# Patient Record
Sex: Male | Born: 1945 | Race: White | Hispanic: No | Marital: Married | Smoking: Former smoker
Health system: Southern US, Community
[De-identification: ages and names within clinical notes are randomized; demographics above are authoritative.]

## PROBLEM LIST (undated history)

## (undated) ENCOUNTER — Emergency Department (HOSPITAL_BASED_OUTPATIENT_CLINIC_OR_DEPARTMENT_OTHER): Admission: EM | Payer: Medicare Other | Source: Home / Self Care

## (undated) DIAGNOSIS — F5104 Psychophysiologic insomnia: Secondary | ICD-10-CM

## (undated) DIAGNOSIS — I10 Essential (primary) hypertension: Secondary | ICD-10-CM

## (undated) DIAGNOSIS — E785 Hyperlipidemia, unspecified: Secondary | ICD-10-CM

## (undated) DIAGNOSIS — I499 Cardiac arrhythmia, unspecified: Secondary | ICD-10-CM

## (undated) DIAGNOSIS — I878 Other specified disorders of veins: Secondary | ICD-10-CM

## (undated) DIAGNOSIS — J986 Disorders of diaphragm: Secondary | ICD-10-CM

## (undated) DIAGNOSIS — Z87891 Personal history of nicotine dependence: Secondary | ICD-10-CM

## (undated) DIAGNOSIS — E669 Obesity, unspecified: Secondary | ICD-10-CM

## (undated) DIAGNOSIS — I350 Nonrheumatic aortic (valve) stenosis: Secondary | ICD-10-CM

## (undated) DIAGNOSIS — C801 Malignant (primary) neoplasm, unspecified: Secondary | ICD-10-CM

## (undated) DIAGNOSIS — R600 Localized edema: Secondary | ICD-10-CM

## (undated) DIAGNOSIS — I482 Chronic atrial fibrillation, unspecified: Secondary | ICD-10-CM

## (undated) DIAGNOSIS — E66811 Obesity, class 1: Secondary | ICD-10-CM

## (undated) HISTORY — DX: Personal history of nicotine dependence: Z87.891

## (undated) HISTORY — PX: TONSILLECTOMY: SUR1361

## (undated) HISTORY — DX: Nonrheumatic aortic (valve) stenosis: I35.0

## (undated) HISTORY — DX: Chronic atrial fibrillation, unspecified: I48.20

## (undated) HISTORY — DX: Essential (primary) hypertension: I10

## (undated) HISTORY — DX: Obesity, unspecified: E66.9

## (undated) HISTORY — DX: Obesity, class 1: E66.811

---

## 2005-08-23 ENCOUNTER — Ambulatory Visit: Payer: Self-pay | Admitting: Internal Medicine

## 2005-09-25 ENCOUNTER — Ambulatory Visit: Payer: Self-pay | Admitting: Internal Medicine

## 2005-11-20 ENCOUNTER — Ambulatory Visit: Payer: Self-pay | Admitting: Internal Medicine

## 2006-11-07 ENCOUNTER — Ambulatory Visit: Payer: Self-pay | Admitting: Internal Medicine

## 2007-11-06 ENCOUNTER — Ambulatory Visit: Payer: Self-pay | Admitting: Internal Medicine

## 2007-11-06 DIAGNOSIS — E669 Obesity, unspecified: Secondary | ICD-10-CM

## 2007-11-06 DIAGNOSIS — R600 Localized edema: Secondary | ICD-10-CM

## 2007-11-06 DIAGNOSIS — R0602 Shortness of breath: Secondary | ICD-10-CM | POA: Insufficient documentation

## 2007-11-06 DIAGNOSIS — I10 Essential (primary) hypertension: Secondary | ICD-10-CM | POA: Insufficient documentation

## 2008-03-11 ENCOUNTER — Emergency Department (HOSPITAL_COMMUNITY): Admission: EM | Admit: 2008-03-11 | Discharge: 2008-03-11 | Payer: Self-pay | Admitting: Emergency Medicine

## 2011-01-18 NOTE — Assessment & Plan Note (Signed)
[facility]                             PULMONARY OFFICE NOTE   Alex Alvarez                      MRN:          [id number]  DATE:11/07/2006                            DOB:          Oct 23, 1945    PROBLEM LIST:  1. Dyspnea.  2. Left hemi-diaphragm paresis.  3. Exogenous obesity and deconditioning.  4. Peripheral edema.   HISTORY OF PRESENT ILLNESS:  A 1 year followup. He has done very well  through this winter with no particular problems. Over the past year, he  has been walking regularly to lose weight and has noted improvement in  his breathing as expected with this. He had pneumococcal vaccine in  2007. No routine cough, chest pain, or sputum.   MEDICATIONS:  Diovan/HCTZ 12.5 mg.   ALLERGIES:  LAMISIL, which causes hives.   OBJECTIVE:  VITAL SIGNS:  Weight 250 pounds, blood pressure 150/90,  pulse 72. Room air saturation 96%. He has lost 13 pounds since last  year.  LUNGS:  There is dullness to percussion at the left base. Otherwise,  chest is clear. Breathing is unlabored. There is no stridor or neck vein  distention.  HEART:  Sounds are regular without murmur. No peripheral edema.   IMPRESSION:  Chronic left hemi-diaphragm paresis. Chest x-ray a year ago  indicates no nodules or masses.   PLAN:  Schedule return in 1 year. Keep walking.     Clinton D. Maple Hudson, MD, Tonny Bollman, FACP  Electronically Signed    CDY/MedQ  DD: 11/08/2006  DT: 11/08/2006  Job #: [id number]   cc:   [facility]

## 2011-02-22 HISTORY — PX: TRANSTHORACIC ECHOCARDIOGRAM: SHX275

## 2011-07-30 HISTORY — PX: NM MYOVIEW LTD: HXRAD82

## 2012-10-28 ENCOUNTER — Other Ambulatory Visit (HOSPITAL_COMMUNITY): Payer: Self-pay | Admitting: Cardiovascular Disease

## 2012-10-28 DIAGNOSIS — I4891 Unspecified atrial fibrillation: Secondary | ICD-10-CM

## 2012-10-31 DIAGNOSIS — I350 Nonrheumatic aortic (valve) stenosis: Secondary | ICD-10-CM

## 2012-10-31 HISTORY — DX: Nonrheumatic aortic (valve) stenosis: I35.0

## 2012-11-05 ENCOUNTER — Encounter (HOSPITAL_COMMUNITY): Payer: Self-pay

## 2012-11-05 ENCOUNTER — Ambulatory Visit (HOSPITAL_COMMUNITY): Payer: Self-pay

## 2012-11-12 ENCOUNTER — Other Ambulatory Visit (HOSPITAL_COMMUNITY): Payer: Self-pay | Admitting: Cardiovascular Disease

## 2012-11-12 ENCOUNTER — Ambulatory Visit (HOSPITAL_COMMUNITY)
Admission: RE | Admit: 2012-11-12 | Discharge: 2012-11-12 | Disposition: A | Discharge status: Home / Self Care | Payer: Managed Care, Other (non HMO) | Source: Ambulatory Visit | Attending: Cardiovascular Disease | Admitting: Cardiovascular Disease

## 2012-11-12 DIAGNOSIS — I714 Abdominal aortic aneurysm, without rupture, unspecified: Secondary | ICD-10-CM | POA: Insufficient documentation

## 2012-11-12 HISTORY — PX: TRANSTHORACIC ECHOCARDIOGRAM: SHX275

## 2012-11-12 HISTORY — PX: OTHER SURGICAL HISTORY: SHX169

## 2012-11-12 NOTE — Progress Notes (Signed)
Aorta Duplex Completed. Sturdivant, Rita D  

## 2012-11-12 NOTE — Progress Notes (Signed)
[facility]   2D echo completed 11/12/2012.   Cindy Rigg, RDCS  

## 2013-04-23 ENCOUNTER — Encounter: Payer: Self-pay | Admitting: Cardiovascular Disease

## 2013-10-14 ENCOUNTER — Other Ambulatory Visit: Payer: Self-pay

## 2013-10-14 MED ORDER — SIMVASTATIN 20 MG PO TABS: 20.0000 mg | ORAL_TABLET | Freq: Every day | ORAL | Status: DC

## 2013-10-14 NOTE — Telephone Encounter (Signed)
Rx was sent to pharmacy electronically. 

## 2013-10-19 ENCOUNTER — Ambulatory Visit: Payer: Medicare Other | Admitting: Cardiology

## 2013-11-02 ENCOUNTER — Telehealth: Payer: Self-pay | Admitting: *Deleted

## 2013-11-29 ENCOUNTER — Ambulatory Visit: Payer: Medicare Other | Admitting: Cardiology

## 2013-12-21 ENCOUNTER — Encounter: Payer: Self-pay | Admitting: *Deleted

## 2013-12-24 ENCOUNTER — Ambulatory Visit (INDEPENDENT_AMBULATORY_CARE_PROVIDER_SITE_OTHER): Payer: Managed Care, Other (non HMO) | Admitting: Cardiology

## 2013-12-24 VITALS — BP 140/106 | HR 56 | Ht 72.0 in | Wt 235.9 lb

## 2013-12-24 DIAGNOSIS — I10 Essential (primary) hypertension: Secondary | ICD-10-CM

## 2013-12-24 DIAGNOSIS — I480 Paroxysmal atrial fibrillation: Secondary | ICD-10-CM

## 2013-12-24 DIAGNOSIS — E785 Hyperlipidemia, unspecified: Secondary | ICD-10-CM

## 2013-12-24 DIAGNOSIS — E669 Obesity, unspecified: Secondary | ICD-10-CM

## 2013-12-24 DIAGNOSIS — Z01818 Encounter for other preprocedural examination: Secondary | ICD-10-CM

## 2013-12-24 DIAGNOSIS — I4891 Unspecified atrial fibrillation: Secondary | ICD-10-CM

## 2013-12-24 DIAGNOSIS — I872 Venous insufficiency (chronic) (peripheral): Secondary | ICD-10-CM

## 2013-12-24 MED ORDER — SIMVASTATIN 20 MG PO TABS: 20.0000 mg | ORAL_TABLET | Freq: Every day | ORAL | Status: DC

## 2013-12-24 NOTE — Patient Instructions (Addendum)
Continue with current medication  Okay to have cataract surgery, may hold ASPIRIN  AND PLAVIX for 5 days prior to surgery and restart after per Dr Talbert Forest.  Your physician wants you to follow-up ii 6 month DR HARDING.  You will receive a reminder letter in the mail two months in advance. If you don't receive a letter, please call our office to schedule the follow-up appointment.

## 2013-12-25 ENCOUNTER — Encounter: Payer: Self-pay | Admitting: Cardiology

## 2013-12-25 DIAGNOSIS — Z01810 Encounter for preprocedural cardiovascular examination: Secondary | ICD-10-CM | POA: Insufficient documentation

## 2013-12-25 DIAGNOSIS — I878 Other specified disorders of veins: Secondary | ICD-10-CM | POA: Insufficient documentation

## 2013-12-25 DIAGNOSIS — I4821 Permanent atrial fibrillation: Secondary | ICD-10-CM | POA: Insufficient documentation

## 2013-12-25 DIAGNOSIS — I4811 Longstanding persistent atrial fibrillation: Secondary | ICD-10-CM | POA: Insufficient documentation

## 2013-12-25 DIAGNOSIS — I872 Venous insufficiency (chronic) (peripheral): Secondary | ICD-10-CM | POA: Insufficient documentation

## 2013-12-25 DIAGNOSIS — E785 Hyperlipidemia, unspecified: Secondary | ICD-10-CM | POA: Insufficient documentation

## 2013-12-25 NOTE — Assessment & Plan Note (Signed)
Well-controlled heart rate with diltiazem. Based on previous discussions with Dr. Rollene Fare, Toprol aspirin plus Plavix we discussed the relative risk reductions using aspirin versus aspirin plus Plavix versus warfarin or NOAC. He has left her, what this point continuing with aspirin plus Plavix, but it recommended that as he gets closer to 68 years old and above, that we consider switching him to NOAC. They both agree with this plan, fully understanding the risks of bleeding versus stroke using the [place].

## 2013-12-25 NOTE — Assessment & Plan Note (Signed)
His diastolic pressure seems elevated today out of proportion to what one would expect. He tells me at home his blood pressures are usually in the 120/80 range. Myelination is to just simply continue with his current regimen.

## 2013-12-25 NOTE — Assessment & Plan Note (Signed)
Deferred to PCP. Upon simvastatin. We may need to consider switching from simvastatin to a different statin simply because of possible interaction with diltiazem if the dose needs to be increased.

## 2013-12-25 NOTE — Assessment & Plan Note (Signed)
Findings are definitely consistent with mild venous stasis. 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 He is not interested in a procedure to evaluate for possible ablation procedures. The edema is not significant enough for him to consider using a true diuretic. We could consider increasing the HCTZ complement his Diovan HCT, but for now he seems stable.

## 2013-12-25 NOTE — Assessment & Plan Note (Signed)
For cataract surgery which is very low risk. He is low risk patient. Okay to stop aspirin plus Plavix for the procedure; restart when safe.

## 2013-12-25 NOTE — Assessment & Plan Note (Signed)
Recommend continue exercise and dietary modification.

## 2013-12-25 NOTE — Progress Notes (Signed)
PATIENT: Alex Alvarez MRN: [id number] DOB: [birth year] PCP: Woody Seller, MD  Clinic Note: Chief Complaint  Patient presents with  . 6 month visit    no chest pain , no sob ,no edema-wearing support hose --recent flight - legs liitle swelling,---NEED CLEARANCE CATARACT SURGERY-  Dr Talbert Forest   HPI: Alex Alvarez is a 68 y.o. male with a PMH below who presents today for establishment of cardiology care at the retirement Dr. Terance Ice. He is a history of atrial fibrillation (likely paroxysmal) as well as hypertension and lower extremity edema with possible venous stasis. Saw Dr. Rollene Fare in August of 2014. After detailed discussions with Dr. Rollene Fare during his last couple visits, they decided to start him on aspirin plus Plavix based on a CHADS2-VASc score of 2. He is scheduled to have cataract surgery by Dr. Talbert Forest  Interval History: He remains essentially asymptomatic from an atrial fibrillation standpoint. He says he notes at night when he lies down after taking his Ambien, but his heart rate will slow down, but otherwise really denies any sensation of irregular or rapid heartbeats. He denies any chest tightness or pressure symptoms to suggest angina with rest or exertion. No dyspnea with rest or exertion. No PND, orthopnea but he does have chronic mild edema for which he uses compression stockings. He is concerned about some mild discoloration in his legs and feet but otherwise when he puts his feet up at the end of the day, the swelling goes down.  The remainder of cardiac review of systems is as follows: no chest pain or dyspnea on exertion positive for - edema and irregular heartbeat negative for - dyspnea on exertion, loss of consciousness, murmur, orthopnea, palpitations, paroxysmal nocturnal dyspnea, rapid heart rate or shortness of breath, lightheadedness/dizziness SAB/near-syncope TIA/amaurosis fugax symptoms, melena hematochezia, hematuria, epistaxis. No  claudication.   Past Medical History  Diagnosis Date  . Hypertension   . Paroxysmal atrial fibrillation 8/132014 office note    chronic afib dates back to June 2012; CHADS2-VASc score 2; candidate for dual  antiplatelet therapy based on active C and active S STUDIES  for thromboembolic prophyaxis--actually ot on coumadin but on aspirin ad plavix  . Former light tobacco smoker      smoked a pipe; quit 25+ years ago   . Obesity (BMI 30.0-34.9)     Prior Cardiac Evaluation and Past Surgical History: Past Surgical History  Procedure Laterality Date  . Transthoracic echocardiogram  02/22/2011    EF 50-55%; with mild inferior wall hypokinesis, mild left atrial enlargement of 4.2cm  . Nm myoview ltd  07/30/2011    non-gated study seconadry to a fib;exercise capcity 7 mets--mildly reduced;low risk scan  . Aorta doppler  11/12/2012    normal abdomnal aortic duplex   . Transthoracic echocardiogram  11/12/12    afib; EF 50-55%    No Known Allergies  Current Outpatient Prescriptions  Medication Sig Dispense Refill  . aspirin EC 81 MG tablet Take 2 tablets daily.      . clopidogrel (PLAVIX) 75 MG tablet Take 75 mg by mouth daily with breakfast.      . diclofenac (VOLTAREN) 75 MG EC tablet Take 75 mg by mouth daily.      [name] diltiazem (CARTIA XT) 240 MG 24 hr capsule Take 240 mg by mouth daily.      . simvastatin (ZOCOR) 20 MG tablet Take 1 tablet (20 mg total) by mouth at bedtime.  90 tablet  3  . valsartan-hydrochlorothiazide (DIOVAN-HCT)  320-12.5 MG per tablet Take 1 tablet by mouth daily.      [name] zolpidem (AMBIEN) 10 MG tablet Take 10 mg by mouth at bedtime.       No current facility-administered medications for this visit.    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   He is active and ambulatory, walking on a regular basis roughly 3-5 days a week 3 miles a time.   He used to smoke a pipe but  quit 25 years ago.    Family History:  Cancer in his maternal grandfather; Cancer - Ovarian in his mother.  ROS: A comprehensive Review of Systems - Negative except Symptoms noted in history of present illness.  PHYSICAL EXAM BP 140/106  Pulse 56  Ht 6' (1.829 m)  Wt 235 lb 14.4 oz (107.004 kg)  BMI 31.99 kg/m2 General appearance: alert, cooperative, appears stated age, no distress, mildly obese and Well-nourished and well-groomed. Pleasant mood and affect Neck: no adenopathy, no carotid bruit, no JVD and supple, symmetrical, trachea midline Lungs: clear to auscultation bilaterally, normal percussion bilaterally and Nonlabored, good movement. Heart: irregularly irregular rhythm and Normal S1-S2. Nondisplaced PMI. No M./R./G. Abdomen: soft, non-tender; bowel sounds normal; no masses,  no organomegaly Extremities: edema 1+ in the ankles and feet with no clubbing or cyanosis., no ulcers, gangrene or trophic changes and venous stasis dermatitis noted Pulses: 2+ and symmetric Skin: Mild punctate macular discoloration in the feet and ankles consistent with venous stasis dermatitis Neurologic: Alert and oriented X 3, normal strength and tone. Normal symmetric reflexes. Normal coordination and gait   Adult ECG Report  Rate:  56  ;  Rhythm: atrial fibrillation and With slow/controlled ventricular response  QRS Axis:  67  ;  PR Interval: * ;  QRS Duration: 88 ; QTc:  397; normal voltage  Conduction Disturbances: none  Other Abnormalities: none   Narrative Interpretation:  A. fib with controlled rate. Otherwise normal   Recent Labs:  none  ASSESSMENT / PLAN: Relatively stable with controlled A. fib, asymptomatic. No reason that he cannot undergo cataract surgery. He can stop aspirin plus Plavix if need be.  Paroxysmal atrial fibrillation Well-controlled heart rate with diltiazem. Based on previous discussions with Dr. Rollene Fare, Toprol aspirin plus Plavix we discussed the relative risk  reductions using aspirin versus aspirin plus Plavix versus warfarin or NOAC. He has left her, what this point continuing with aspirin plus Plavix, but it recommended that as he gets closer to 38 years old and above, that we consider switching him to NOAC. They both agree with this plan, fully understanding the risks of bleeding versus stroke using the [place].  Venous insufficiency of lower extremity Findings are definitely consistent with mild venous stasis. 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 He is not interested in a procedure to evaluate for possible ablation procedures. The edema is not significant enough for him to consider using a true diuretic. We could consider increasing the HCTZ complement his Diovan HCT, but for now he seems stable.  HYPERTENSION His diastolic pressure seems elevated today out of proportion to what one would expect. He tells me at home his blood pressures are usually in the 120/80 range. Myelination is to just simply continue with his current regimen.  EXOGENOUS OBESITY Recommend continue exercise and dietary modification.  Dyslipidemia -  on statin; monitored by PCP Deferred to PCP. Upon simvastatin. We may need to consider switching from simvastatin to a different statin simply because of possible interaction with diltiazem if the dose needs to be increased.  Preoperative clearance For cataract surgery which is very low risk. He is low risk patient. Okay to stop aspirin plus Plavix for the procedure; restart when safe.   Orders Placed This Encounter  Procedures  . EKG 12-Lead   Meds ordered this encounter  Medications  . simvastatin (ZOCOR) 20 MG tablet    Sig: Take 1 tablet (20 mg total) by mouth at bedtime.    Dispense:  90 tablet    Refill:  3    Followup:  6  months  Laconya Clere W. Ellyn Hack, M.D., M.S. Interventional Cardiolgy [facility]

## 2014-01-03 NOTE — Telephone Encounter (Signed)
Encounter Closed---01/03/14 TP 

## 2014-02-07 ENCOUNTER — Telehealth: Payer: Self-pay | Admitting: Cardiovascular Disease

## 2014-02-07 NOTE — Telephone Encounter (Signed)
Called Dr. Talbert Forest office informed them the clearance requests never received. They can fax over another one, however I will fax over a note that Dr. Ellyn Hack had dictated indicating the patient has been cleared for surgery. Note faxed to [phone] Attent: Sharyn Lull.

## 2014-02-07 NOTE — Telephone Encounter (Signed)
Returned a call to  Dr. Talbert Forest office. They are currently closed for lunch. Will attempt to call back later.

## 2014-02-07 NOTE — Telephone Encounter (Signed)
She said she have faxed over two fax to clear him for surgery. One was sent on 4-17 and the last one was sent 5-11. He is scheduled for surgery on Friday,need this asap.She is faxing another right away.

## 2014-03-15 ENCOUNTER — Telehealth: Payer: Self-pay | Admitting: Cardiology

## 2014-03-15 NOTE — Telephone Encounter (Signed)
Spoke to [place] THAT PATIENT HAD OFFICE NOTE 12/25/13 RN INFORMED [employer]

## 2014-03-15 NOTE — Telephone Encounter (Signed)
They still have not received the clarence for surgery,it have been faxed over 3 times.Are you getting the fax?His surgery is scheduled for 03-28-14,need this back asap.

## 2014-03-15 NOTE — Telephone Encounter (Signed)
FOLLOW UP FROM  LAST NOTE RN informed Alex Alvarez ,unable to locate clearance from Dr Talbert Forest. RN faxed last office note from 12/26/23, confirm it had been received. Alex Alvarez will show to Dr Talbert Forest.[name]

## 2014-03-21 ENCOUNTER — Telehealth: Payer: Self-pay | Admitting: Cardiology

## 2014-03-21 MED ORDER — CLOPIDOGREL BISULFATE 75 MG PO TABS: 75.0000 mg | ORAL_TABLET | Freq: Every day | ORAL | Status: DC

## 2014-03-21 NOTE — Telephone Encounter (Signed)
Rx refill sent to patient pharmacy   

## 2014-03-21 NOTE — Telephone Encounter (Signed)
Need a new prescription for his generic plavix, Please call to [facility].Pt is going to change insurance,any questions please call him.

## 2014-03-31 ENCOUNTER — Telehealth: Payer: Self-pay | Admitting: Cardiology

## 2014-04-05 NOTE — Telephone Encounter (Signed)
Closed encounter °

## 2014-05-17 ENCOUNTER — Other Ambulatory Visit: Payer: Self-pay | Admitting: *Deleted

## 2014-05-17 ENCOUNTER — Telehealth: Payer: Self-pay | Admitting: Cardiology

## 2014-05-17 MED ORDER — SIMVASTATIN 20 MG PO TABS: 20.0000 mg | ORAL_TABLET | Freq: Every day | ORAL | Status: DC

## 2014-05-17 NOTE — Telephone Encounter (Signed)
Opened by mistake.

## 2014-05-17 NOTE — Telephone Encounter (Signed)
Rx sent into [facility]. Patient notified

## 2014-05-17 NOTE — Telephone Encounter (Signed)
Patient needs refill on his Simvastatin 20 mg # 90 i daily.

## 2014-06-16 ENCOUNTER — Telehealth: Payer: Self-pay | Admitting: Cardiology

## 2014-06-17 NOTE — Telephone Encounter (Signed)
Close encounter 

## 2014-06-23 ENCOUNTER — Ambulatory Visit: Payer: Managed Care, Other (non HMO) | Admitting: Cardiology

## 2014-06-29 ENCOUNTER — Telehealth: Payer: Self-pay | Admitting: Cardiology

## 2014-07-01 NOTE — Telephone Encounter (Signed)
Closed encounter °

## 2014-08-01 ENCOUNTER — Ambulatory Visit (INDEPENDENT_AMBULATORY_CARE_PROVIDER_SITE_OTHER): Payer: Medicare Other | Admitting: Cardiology

## 2014-08-01 ENCOUNTER — Encounter: Payer: Self-pay | Admitting: Cardiology

## 2014-08-01 VITALS — BP 134/82 | HR 61 | Ht 72.0 in | Wt 236.0 lb

## 2014-08-01 DIAGNOSIS — I10 Essential (primary) hypertension: Secondary | ICD-10-CM

## 2014-08-01 DIAGNOSIS — I482 Chronic atrial fibrillation, unspecified: Secondary | ICD-10-CM

## 2014-08-01 DIAGNOSIS — E669 Obesity, unspecified: Secondary | ICD-10-CM

## 2014-08-01 DIAGNOSIS — E785 Hyperlipidemia, unspecified: Secondary | ICD-10-CM

## 2014-08-01 DIAGNOSIS — I872 Venous insufficiency (chronic) (peripheral): Secondary | ICD-10-CM

## 2014-08-01 MED ORDER — ATORVASTATIN CALCIUM 10 MG PO TABS: 10.0000 mg | ORAL_TABLET | Freq: Every day | ORAL | Status: DC

## 2014-08-01 NOTE — Patient Instructions (Addendum)
Take 1/2 tablets of simvastatin until bottles is complete then stop and start lipitor (atorvastatin) 10 mg one tablet at bedtime.  Administrator[facility] when you are ready to pick up ATORVASTATIN)  Your physician wants you to follow-up in 12 months Dr Ellyn Hack.   You will receive a reminder letter in the mail two months in advance. If you don't receive a letter, please call our office to schedule the follow-up appointment.

## 2014-08-01 NOTE — Progress Notes (Signed)
PATIENT: Alex Alvarez MRN: [id number] DOB: 25-Mar-1946 PCP: Woody Seller, MD  Clinic Note: Chief Complaint  Patient presents with  . Follow-up    6 mth visit.   pt denies chest pain, swelling and sob  . Atrial Fibrillation    Persistant.  . Hyperlipidemia    Cramping with Simvastatin   HPI: Alex Alvarez is a 68 y.o. male with a PMH below who presents today for follow-up of his atrial fibrillation, hypertension and dyslipidemia. He also has a history of lower extremity edema with possible venous stasis. He is a former patient of Dr. Rollene Fare whom I first saw in April of this year.. After detailed discussions with Dr. Rollene Fare during his last couple visits, they decided to start him on aspirin plus Plavix based on a CHADS2-VASc score of 2. Since I last saw him he has had his cataract surgeries which went well.  Interval History: He presents today really with no major complaints from a cardiac standpoint. No active sensation of chest tightness/pressure with rest or exertion. He really denies any sensation of irregular or rapid heartbeats that would suggest that he understands when he is there when he is not in atrial fibrillation. He denies any chest tightness or pressure symptoms to suggest angina with rest or exertion. No dyspnea with rest or exertion. No PND, orthopnea but he does have chronic mild edema for which he uses compression stockings. He does have some mild cramping in his legs in the morning, but not associated with exertion or walking. The remainder of cardiac review of systems is as follows: no chest pain or dyspnea on exertion positive for - edema and irregular heartbeat negative for - dyspnea on exertion, loss of consciousness, murmur, orthopnea, palpitations, paroxysmal nocturnal dyspnea, rapid heart rate or shortness of breath, lightheadedness/dizziness SAB/near-syncope TIA/amaurosis fugax symptoms, melena hematochezia, hematuria, epistaxis. No  claudication.  Past Medical History  Diagnosis Date  . Hypertension   . Chronic atrial fibrillation 8/132014 office note    chronic afib dates back to June 2012; CHADS2-VASc score 2; candidate for dual  antiplatelet therapy based on active C and active S STUDIES  for thromboembolic prophyaxis--actually ot on coumadin but on aspirin ad plavix  . Former light tobacco smoker      smoked a pipe; quit 25+ years ago   . Obesity (BMI 30.0-34.9)     Prior Cardiac Evaluation and Past Surgical History: Past Surgical History  Procedure Laterality Date  . Transthoracic echocardiogram  02/22/2011    EF 50-55%; with mild inferior wall hypokinesis, mild left atrial enlargement of 4.2cm  . Nm myoview ltd  07/30/2011    non-gated study seconadry to a fib;exercise capcity 7 mets--mildly reduced;low risk scan  . Aorta doppler  11/12/2012    normal abdomnal aortic duplex   . Transthoracic echocardiogram  11/12/12    afib; EF 50-55%    Allergies  Allergen Reactions  . Lamisil [Terbinafine Hcl] Rash    Current Outpatient Prescriptions  Medication Sig Dispense Refill  . aspirin EC 81 MG tablet Take 2 tablets daily.    . clopidogrel (PLAVIX) 75 MG tablet Take 1 tablet (75 mg total) by mouth daily with breakfast. 30 tablet 5  . diclofenac (VOLTAREN) 75 MG EC tablet Take 75 mg by mouth daily.    [name] diltiazem (CARTIA XT) 240 MG 24 hr capsule Take 240 mg by mouth daily.    [name] terbinafine (LAMISIL) 250 MG tablet Take 250 mg by mouth. 1 tablet every 3 days  0  . valsartan-hydrochlorothiazide (DIOVAN-HCT) 320-12.5 MG per tablet Take 1 tablet by mouth daily.    [name] zolpidem (AMBIEN) 10 MG tablet Take 10 mg by mouth at bedtime.    [name] atorvastatin (LIPITOR) 10 MG tablet Take 1 tablet (10 mg total) by mouth daily. 90 tablet 3   No current facility-administered medications for this visit.    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   He is active and ambulatory, walking on a regular basis roughly 3-5 days a week 3 miles a time.   He used to smoke a pipe but quit 25 years ago.   Family History:  Cancer in his maternal grandfather; Cancer - Ovarian in his mother.  ROS: A comprehensive Review of Systems - Negative except Symptoms noted in history of present illness. Review of Systems  Constitutional: Positive for weight loss. Negative for malaise/fatigue.  HENT: Negative for nosebleeds.   Eyes: Negative for blurred vision and double vision.  Respiratory: Negative for cough and wheezing.   Cardiovascular: Negative for chest pain and claudication.  Gastrointestinal: Negative for blood in stool and melena.       Described relatively normal bowel movements   Genitourinary: Negative for hematuria.  Musculoskeletal: Negative for myalgias and falls.       Leg cramping   Wt Readings from Last 3 Encounters:  08/01/14 236 lb (107.049 kg)  12/24/13 235 lb 14.4 oz (107.004 kg)  11/06/07 252 lb 4 oz (114.42 kg)   PHYSICAL EXAM BP 134/82 mmHg  Pulse 61  Ht 6' (1.829 m)  Wt 236 lb (107.049 kg)  BMI 32.00 kg/m2 General appearance: alert, cooperative, appears stated age, no distress, mildly obese and Well-nourished and well-groomed. Pleasant mood and affect Neck: no adenopathy, no carotid bruit, no JVD and supple, symmetrical, trachea midline Lungs: CTA B, normal percussion bilaterally and Nonlabored, good movement. Heart: irregularly irregular rhythm; Nl S1-S2. Nondisplaced PMI. No M./R./G. Abdomen: soft, non-tender; bowel sounds normal; no masses,  no organomegaly Extremities: edema 1+ in the ankles and feet with no clubbing or cyanosis. Mild venous stasis dermatitis noted Pulses: 2+ and symmetric Skin: Mild punctate macular discoloration in the feet and ankles consistent with venous stasis dermatitis Neurologic: Alert and oriented X 3, normal strength and tone. Normal symmetric reflexes. Normal  coordination and gait   Adult ECG Report  Rate:  61;  Rhythm: atrial fibrillation with controlled ventricular response  Narrative Interpretation:  A. fib with controlled rate. Otherwise normal ; stable EKG  Recent Labs:  none  ASSESSMENT / PLAN: Relatively stable with controlled A. fib, asymptomatic. He does have some mild cramping and continues to be on diltiazem plus simvastatin. Back on aspirin plus Plavix.  Dyslipidemia - on statin; monitored by PCP Having mild cramping & muscle weakness with Simvastatin -- ? If this could be related to interaction with Diltiazem. Complete current bottle of Simvastatin taking 1/2 tab daily, then will change to Lipitor 10 mg daily  Chronic atrial fibrillation Continues to be well rate-controlled with calcium channel blocker. Anticoagulated only with Plavix based on discussion with Dr. Rollene Fare. Would switch to NOAC at age 61.  Essential hypertension Well controlled overall with current medications. Continue without changes.  Venous insufficiency of lower extremity Doing well with compression stockings and elevation.  Obesity Continue to discuss the importance of dietary modification and exercise..   Orders Placed This Encounter  Procedures  . EKG 12-Lead    Followup:  One year   DAVID W. Ellyn Hack, M.D., M.S. Interventional Cardiolgy [facility]

## 2014-08-01 NOTE — Assessment & Plan Note (Addendum)
Having mild cramping & muscle weakness with Simvastatin -- ? If this could be related to interaction with Diltiazem. Complete current bottle of Simvastatin taking 1/2 tab daily, then will change to Lipitor 10 mg daily

## 2014-08-02 ENCOUNTER — Encounter: Payer: Self-pay | Admitting: Cardiology

## 2014-08-02 NOTE — Assessment & Plan Note (Signed)
Well controlled overall with current medications. Continue without changes.

## 2014-08-02 NOTE — Assessment & Plan Note (Signed)
Doing well with compression stockings and elevation.

## 2014-08-02 NOTE — Assessment & Plan Note (Signed)
Continues to be well rate-controlled with calcium channel blocker. Anticoagulated only with Plavix based on discussion with Dr. Rollene Fare. Would switch to NOAC at age 68.

## 2014-08-02 NOTE — Assessment & Plan Note (Signed)
Continue to discuss the importance of dietary modification and exercise.[name]

## 2014-09-09 ENCOUNTER — Other Ambulatory Visit: Payer: Self-pay | Admitting: *Deleted

## 2014-09-09 MED ORDER — CLOPIDOGREL BISULFATE 75 MG PO TABS: 75.0000 mg | ORAL_TABLET | Freq: Every day | ORAL | Status: DC

## 2014-09-09 NOTE — Telephone Encounter (Signed)
Rx refill sent to patient pharmacy   

## 2014-12-20 ENCOUNTER — Other Ambulatory Visit: Payer: Self-pay | Admitting: *Deleted

## 2014-12-20 MED ORDER — ATORVASTATIN CALCIUM 10 MG PO TABS: 10.0000 mg | ORAL_TABLET | Freq: Every day | ORAL | Status: DC

## 2014-12-20 MED ORDER — CLOPIDOGREL BISULFATE 75 MG PO TABS: 75.0000 mg | ORAL_TABLET | Freq: Every day | ORAL | Status: DC

## 2014-12-20 NOTE — Telephone Encounter (Signed)
Rx refill sent to patient pharmacy   

## 2015-02-27 ENCOUNTER — Other Ambulatory Visit: Payer: Self-pay | Admitting: *Deleted

## 2015-02-27 MED ORDER — DILTIAZEM HCL ER COATED BEADS 240 MG PO CP24: 240.0000 mg | ORAL_CAPSULE | Freq: Every day | ORAL | Status: DC

## 2015-02-27 NOTE — Telephone Encounter (Signed)
Rx(s) sent to pharmacy electronically.  

## 2015-03-20 ENCOUNTER — Other Ambulatory Visit: Payer: Self-pay | Admitting: Cardiology

## 2015-03-20 NOTE — Telephone Encounter (Signed)
Rx(s) sent to pharmacy electronically.  

## 2015-03-22 ENCOUNTER — Telehealth: Payer: Self-pay | Admitting: *Deleted

## 2015-03-22 NOTE — Telephone Encounter (Signed)
I have not seen the patient since November.  Has Afib - on ASA/Plavix.  Probably Low Risk from Cardiac Standpoint --> OK to stop ASA/Plavix x 5-7 days pre-op.  Leonie Man, MD

## 2015-03-22 NOTE — Telephone Encounter (Signed)
Left message on home phone. Spoke to patient.  Informed him needs office appointment for cardiac clearance for left knee surgery.  patient preferred appointment on 05/01/15 at 80 - due wife having surgery earlier in the month. Request for surgical clearance:  1. What type of surgery is being performed? Left knee TKA MEDIAL AND LATERAL W/WO PATELLA RESURFACING  2. When is this surgery scheduled?  PENDING CLEARANCE  3. Are there any medications that need to be held prior to surgery and how long? PLAVIX AND ASPIRIN  4. Name of physician performing surgery?  DR Remo Lipps [facility]  5. What is your office phone and fax number?  [phone]  AND PHONE [phone]  4.

## 2015-03-22 NOTE — Telephone Encounter (Signed)
LEFT MESSAGE ON [facility] VOICEMAIL   PATIENT NEEDS  OFFICE APPOINTMENT FOR CARDIAC CLEARANCE. APPOINTMENT SCHEDULE FOR 05/01/2015.

## 2015-03-31 ENCOUNTER — Ambulatory Visit (INDEPENDENT_AMBULATORY_CARE_PROVIDER_SITE_OTHER): Payer: Medicare Other | Admitting: Cardiology

## 2015-03-31 ENCOUNTER — Encounter: Payer: Self-pay | Admitting: Cardiology

## 2015-03-31 VITALS — BP 140/92 | HR 76 | Ht 71.0 in | Wt 236.6 lb

## 2015-03-31 DIAGNOSIS — I482 Chronic atrial fibrillation, unspecified: Secondary | ICD-10-CM

## 2015-03-31 DIAGNOSIS — Z01810 Encounter for preprocedural cardiovascular examination: Secondary | ICD-10-CM | POA: Diagnosis not present

## 2015-03-31 DIAGNOSIS — E785 Hyperlipidemia, unspecified: Secondary | ICD-10-CM | POA: Diagnosis not present

## 2015-03-31 DIAGNOSIS — I10 Essential (primary) hypertension: Secondary | ICD-10-CM

## 2015-03-31 MED ORDER — DILTIAZEM HCL ER 120 MG PO CP12: 120.0000 mg | ORAL_CAPSULE | Freq: Two times a day (BID) | ORAL | Status: DC

## 2015-03-31 NOTE — Progress Notes (Signed)
PATIENT: Alex Alvarez MRN: [id number] DOB: 03/03/1946 PCP: Woody Seller, MD  Clinic Note: Chief Complaint  Patient presents with  . Follow-up    surgical clearance; no chest pain, no shortness of breath, edema-no more than usual, pain in legs-just from meds, cramping in legs-just from meds, no lightheadedness, no dizziness  . Pre-op Exam  . Atrial Fibrillation   HPI: Alex Alvarez is a 69 y.o. male with a PMH below who presents today for preoperative risk assessment. He has a history of atrial fibrillation, hypertension and dyslipidemia. He also has a history of lower extremity edema with possible venous stasis. He is a former patient of Dr. Rollene Fare whom I first saw in April of 2015.[name] After detailed discussions with Dr. Rollene Fare during his last couple visits, they decided to start him on aspirin plus Plavix based on a CHADS2-VASc score of 2. He was converted from simvastatin to atorvastatin to avoid interaction with diltiazem. He is on echocardiogram and Myoview in 2012 as noted below.  I last saw him in November 2015, and he was doing quite well without any major symptoms. He presents today here because he needs to have his left knee replaced.   Interval History: He continues to deny any major complaints from a cardiac standpoint. No active sensation of chest tightness/pressure with rest or exertion. He really denies any sensation of irregular or rapid heartbeats that would suggest that he understands when he is there when he is not in atrial fibrillation. He denies any chest tightness or pressure symptoms to suggest angina with rest or exertion. No dyspnea with rest or exertion. No PND, orthopnea but he does have chronic mild edema for which he uses compression stockings. He does have some mild cramping in his legs in the morning, but not associated with exertion or walking. The remainder of cardiac review of systems is as follows: no chest pain or dyspnea on  exertion positive for - edema and irregular heartbeat negative for - dyspnea on exertion, loss of consciousness, murmur, orthopnea, palpitations, paroxysmal nocturnal dyspnea, rapid heart rate or shortness of breath, lightheadedness/dizziness, syncope/near-syncope TIA/amaurosis fugax symptoms, melena hematochezia, hematuria, epistaxis. No claudication.  He did state that the long-acting diltiazem is now Tier 3 with his insurance company.  Past Medical History  Diagnosis Date  . Hypertension   . Chronic atrial fibrillation 8/132014 office note    chronic afib dates back to June 2012; CHADS2-VASc score 2; candidate for dual  antiplatelet therapy based on active C and active S STUDIES  for thromboembolic prophyaxis--actually ot on coumadin but on aspirin ad plavix  . Former light tobacco smoker      smoked a pipe; quit 25+ years ago   . Obesity (BMI 30.0-34.9)     Prior Cardiac Evaluation and Past Surgical History: Past Surgical History  Procedure Laterality Date  . Transthoracic echocardiogram  02/22/2011    EF 50-55%; with mild inferior wall hypokinesis, mild left atrial enlargement of 4.2cm  . Nm myoview ltd  07/30/2011    non-gated study seconadry to a fib;exercise capcity 7 mets--mildly reduced;low risk scan  . Aorta doppler  11/12/2012    normal abdomnal aortic duplex   . Transthoracic echocardiogram  11/12/12    afib; EF 50-55%    Allergies  Allergen Reactions  . Lipitor [Atorvastatin] Other (See Comments)    Leg cramps  . Lamisil [Terbinafine Hcl] Rash    Current Outpatient Prescriptions  Medication Sig Dispense Refill  . aspirin EC 81 MG tablet  Take 2 tablets daily.    . BELSOMRA 10 MG TABS Take 1 tablet by mouth at bedtime as needed. Take 1 tab at bedtimefor sleep  2  . clopidogrel (PLAVIX) 75 MG tablet Take 1 tablet by mouth  daily with breakfast 90 tablet 3  . diclofenac (VOLTAREN) 75 MG EC tablet Take 75 mg by mouth daily.    . Multiple Vitamins-Minerals (MULTIVITAMIN  WITH MINERALS) tablet Take 1 tablet by mouth daily.    . valsartan-hydrochlorothiazide (DIOVAN-HCT) 320-12.5 MG per tablet Take 1 tablet by mouth daily.    [name] diltiazem (CARDIZEM SR) 120 MG 12 hr capsule Take 1 capsule (120 mg total) by mouth 2 (two) times daily. 180 capsule 3   No current facility-administered medications for this visit.    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   He is active and ambulatory, walking on a regular basis roughly 3-5 days a week 3 miles a time.   He used to smoke a pipe but quit 25 years ago.   Family History:  Cancer in his maternal grandfather; Cancer - Ovarian in his mother.  ROS: A comprehensive Review of Systems - Negative except Symptoms noted in history of present illness. Review of Systems  Constitutional: Positive for weight loss. Negative for malaise/fatigue.  HENT: Negative for nosebleeds.   Eyes: Negative for blurred vision and double vision.  Respiratory: Negative for cough and wheezing.   Cardiovascular: Negative for chest pain and claudication.  Gastrointestinal: Negative for blood in stool and melena.       Described relatively normal bowel movements   Genitourinary: Negative for hematuria.  Musculoskeletal: Positive for joint pain (Severe left knee pain.). Negative for myalgias and falls.       Leg cramping   Wt Readings from Last 3 Encounters:  03/31/15 107.304 kg (236 lb 9 oz)  08/01/14 107.049 kg (236 lb)  12/24/13 107.004 kg (235 lb 14.4 oz)   PHYSICAL EXAM BP 140/92 mmHg  Pulse 76  Ht 5\' 11"  (1.803 m)  Wt 107.304 kg (236 lb 9 oz)  BMI 33.01 kg/m2 General appearance: alert, cooperative, appears stated age, no distress, mildly obese and Well-nourished and well-groomed. Pleasant mood and affect Neck: no adenopathy, no carotid bruit, no JVD and supple, symmetrical, trachea midline Lungs: CTA B, normal percussion bilaterally and  Nonlabored, good movement. Heart: irregularly irregular rhythm; Nl S1-S2. Nondisplaced PMI. No R./G.  Soft 1/6 SEM at RUSB Abdomen: soft, non-tender; bowel sounds normal; no masses,  no organomegaly Extremities: edema 1+ in the ankles and feet with no clubbing or cyanosis. Mild venous stasis dermatitis noted Pulses: 2+ and symmetric Skin: Mild punctate macular discoloration in the feet and ankles consistent with venous stasis dermatitis Neurologic: Alert and oriented X 3, normal strength and tone. Normal symmetric reflexes. Normal coordination and gait   Adult ECG Report  Rate:  76;  Rhythm: atrial fibrillation with controlled ventricular response  Narrative Interpretation:  A. fib with controlled rate. Otherwise normal ; stable EKG  Recent Labs:  none   ASSESSMENT / PLAN: Relatively stable with controlled A. fib, asymptomatic.    Preoperative cardiovascular examination PREOPERATIVE CARDIAC RISK ASSESSMENT   Revised Cardiac Risk Index:  High Risk Surgery: no; knee arthroplasty is low risk from a cardiac standpoint  Defined as Intraperitoneal, intrathoracic or suprainguinal vascular  Active CAD: no; no active angina symptoms. No history of CAD  CHF: no; asymptomatic A. fib  Cerebrovascular Disease: no;   Diabetes: no; On Insulin: no  CKD (Cr >~ 2): no;   Total: 0; although he does have A. fib Estimated Risk of Adverse Outcome: LOW  Estimated Risk of MI, PE, VF/VT (Cardiac Arrest), Complete Heart Block: <0.5 %   ACC/AHA Guidelines for "Clearance":  Step 1 - Need for Emergency Surgery: No:   If Yes - go straight to OR with perioperative surveillance  Step 2 - Active Cardiac Conditions (Unstable Angina, Decompensated HF, Significant  Arrhytmias - Complete HB, Mobitz II, Symptomatic VT or SVT, Severe Aortic Stenosis - mean gradient > 40 mmHg, Valve area < 1.0 cm2):   No: Asymptomatic Afib  If Yes - Evaluate & Treat per ACC/AHA Guidelines  Step 3 -  Low Risk Surgery:  Yes  If Yes --> proceed to OR  If No --> Step 4  Step 4 - Functional Capacity >= 4 METS without symptoms: Yes  If Yes --> proceed to OR  If No --> Step 5  Step 5 --  Clinical Risk Factors (CRF)   3 or more: No:   If Yes -- assess Surgical Risk, --   (High Risk Non-cardiac), Intraabdominal or thoracic vascular surgery consider testing if it will change management.  Intermediate Risk: Proceed to OR with HR control, or consider testing if it will change management  1-2 or more CRFs: No:   If Yes -- assess Surgical Risk, --   (High Risk Non-cardiac), Intraabdominal or thoracic vascular surgery --> Proceed to OR, or consider testing if it will change management.  Intermediate Risk: Proceed to OR with HR control, or consider testing if it will change management  No CRFs: Yes  If Yes --> Proceed to OR  Based on these recommendations, I would simply recommend proceeding to the OR without any further cardiac evaluation. Okay to stop aspirin and Plavix if necessary 5-7 days preop. Would simply restart postop. If postop warfarin is being used, I would not use combination of aspirin plus Plavix until warfarin discontinued, then restart Plavix.  I will be on the lookout for his preop form.  Dyslipidemia - on statin; monitored by PCP Seems to be doing well on atorvastatin. Labs monitored by PCP.  Chronic atrial fibrillation Very much persistent A. Fib. Rate well controlled with diltiazem. Will change from 240 mg daily of the CD dosing to the 12 hour release 120mg  twice a day. Currently he is on aspirin plus Plavix for anticoagulation based on his agreement with Dr. Rollene Fare. Change to NOAC @ ~70-75 y/o.  Essential hypertension Usually better controlled today. We could consider increasing his calcium channel blocker dose if necessary for more blood pressure control. For now he is just above the upper normal.   Orders Placed This Encounter  Procedures  . EKG 12-Lead    Patient  instructions: CHANGE TO DILTIAZEM SR 120 MG  EVERY  12 HOURS   STOP DILTIAZEM 240 MG DAILY  Clearance for knee surgery - low risk  Will send to orthopedic doctor  Your physician wants you to follow-up in 12 months Dr Ellyn Hack.    DAVID W. Ellyn Hack, M.D., M.S. Interventional Cardiolgy [facility]

## 2015-03-31 NOTE — Patient Instructions (Signed)
CHANGE TO DILTIAZEM SR 120 MG  EVERY  12 HOURS   STOP DILTIAZEM 240 MG DAILY  Clearance for knee surgery - low risk  Will send to orthopedic doctor  Your physician wants you to follow-up in 12 months Dr Ellyn Hack. You will receive a reminder letter in the mail two months in advance. If you don't receive a letter, please call our office to schedule the follow-up appointment.

## 2015-04-02 ENCOUNTER — Encounter: Payer: Self-pay | Admitting: Cardiology

## 2015-04-02 NOTE — Assessment & Plan Note (Signed)
PREOPERATIVE CARDIAC RISK ASSESSMENT   Revised Cardiac Risk Index:  High Risk Surgery: no; knee arthroplasty is low risk from a cardiac standpoint  Defined as Intraperitoneal, intrathoracic or suprainguinal vascular  Active CAD: no; no active angina symptoms. No history of CAD  CHF: no; asymptomatic A. fib  Cerebrovascular Disease: no;   Diabetes: no; On Insulin: no  CKD (Cr >~ 2): no;   Total: 0; although he does have A. fib Estimated Risk of Adverse Outcome: LOW  Estimated Risk of MI, PE, VF/VT (Cardiac Arrest), Complete Heart Block: <0.5 %   ACC/AHA Guidelines for "Clearance":  Step 1 - Need for Emergency Surgery: No:   If Yes - go straight to OR with perioperative surveillance  Step 2 - Active Cardiac Conditions (Unstable Angina, Decompensated HF, Significant  Arrhytmias - Complete HB, Mobitz II, Symptomatic VT or SVT, Severe Aortic Stenosis - mean gradient > 40 mmHg, Valve area < 1.0 cm2):   No: Asymptomatic Afib  If Yes - Evaluate & Treat per ACC/AHA Guidelines  Step 3 -  Low Risk Surgery: Yes  If Yes --> proceed to OR  If No --> Step 4  Step 4 - Functional Capacity >= 4 METS without symptoms: Yes  If Yes --> proceed to OR  If No --> Step 5  Step 5 --  Clinical Risk Factors (CRF)   3 or more: No:   If Yes -- assess Surgical Risk, --   (High Risk Non-cardiac), Intraabdominal or thoracic vascular surgery consider testing if it will change management.  Intermediate Risk: Proceed to OR with HR control, or consider testing if it will change management  1-2 or more CRFs: No:   If Yes -- assess Surgical Risk, --   (High Risk Non-cardiac), Intraabdominal or thoracic vascular surgery --> Proceed to OR, or consider testing if it will change management.  Intermediate Risk: Proceed to OR with HR control, or consider testing if it will change management  No CRFs: Yes  If Yes --> Proceed to OR  Based on these recommendations, I would simply recommend  proceeding to the OR without any further cardiac evaluation. Okay to stop aspirin and Plavix if necessary 5-7 days preop. Would simply restart postop. If postop warfarin is being used, I would not use combination of aspirin plus Plavix until warfarin discontinued, then restart Plavix.  I will be on the lookout for his preop form.

## 2015-04-02 NOTE — Assessment & Plan Note (Signed)
Usually better controlled today. We could consider increasing his calcium channel blocker dose if necessary for more blood pressure control. For now he is just above the upper normal.

## 2015-04-02 NOTE — Assessment & Plan Note (Signed)
Seems to be doing well on atorvastatin. Labs monitored by PCP.

## 2015-04-02 NOTE — Assessment & Plan Note (Signed)
Very much persistent A. Fib. Rate well controlled with diltiazem. Will change from 240 mg daily of the CD dosing to the 12 hour release 120mg  twice a day. Currently he is on aspirin plus Plavix for anticoagulation based on his agreement with Dr. Rollene Fare. Change to NOAC @ ~70-70 y/o.

## 2015-04-04 NOTE — Telephone Encounter (Signed)
Left message to call back - Pollyann Savoy  Scheduler at Dr Veverly Fells' office

## 2015-04-04 NOTE — Telephone Encounter (Signed)
SPOKE TO Alex Alvarez  INFORMED HER PATIENT WAS SEEN 03/31/15 AND RECEIVE CLEARANCE ROUTED NOTE AND TELEPHONE MESSAGE

## 2015-05-01 ENCOUNTER — Ambulatory Visit: Payer: Medicare Other | Admitting: Cardiology

## 2015-05-04 HISTORY — PX: SKIN BIOPSY: SHX1

## 2015-06-14 ENCOUNTER — Other Ambulatory Visit (HOSPITAL_COMMUNITY): Payer: Medicare Other

## 2015-06-19 ENCOUNTER — Telehealth: Payer: Self-pay | Admitting: Cardiology

## 2015-06-19 NOTE — Telephone Encounter (Signed)
Please call,about his medicine, His last office visit he was supposed to be getting a less expensive medicine for his Diltiazem.

## 2015-06-19 NOTE — Telephone Encounter (Signed)
Spoke to patient. He stated cardizem was supposed to be switched at last office visit to something cheaper.  Advised that upon review of chart, appears that we did switch from cardia XT to the SR 12-hr tablets. Informed him this should be less expensive, but can't be certain. Best advice is to contact pharmacy and get information from them. Pt voiced understanding. Pt will call pharmacy, inform us if any concerns.

## 2015-06-20 ENCOUNTER — Other Ambulatory Visit: Payer: Self-pay | Admitting: Cardiology

## 2015-06-20 MED ORDER — DILTIAZEM HCL ER 120 MG PO CP12: 120.0000 mg | ORAL_CAPSULE | Freq: Two times a day (BID) | ORAL | Status: DC

## 2015-06-22 NOTE — H&P (Signed)
Alex Alvarez is an 69 y.o. male.    Chief Complaint: left knee pain  HPI: Pt is a 69 y.o. male complaining of left knee pain for multiple years. Pain had continually increased since the beginning. X-rays in the clinic show end-stage arthritic changes of the left knee. Pt has tried various conservative treatments which have failed to alleviate their symptoms, including injections and therapy. Various options are discussed with the patient. Risks, benefits and expectations were discussed with the patient. Patient understand the risks, benefits and expectations and wishes to proceed with surgery.   PCP:  Woody Seller, MD  D/C Plans: Home  PMH: Past Medical History  Diagnosis Date  . Hypertension   . Chronic atrial fibrillation 8/132014 office note    chronic afib dates back to June 2012; CHADS2-VASc score 2; candidate for dual  antiplatelet therapy based on active C and active S STUDIES  for thromboembolic prophyaxis--actually ot on coumadin but on aspirin ad plavix  . Former light tobacco smoker      smoked a pipe; quit 25+ years ago   . Obesity (BMI 30.0-34.9)     PSH: Past Surgical History  Procedure Laterality Date  . Transthoracic echocardiogram  02/22/2011    EF 50-55%; with mild inferior wall hypokinesis, mild left atrial enlargement of 4.2cm  . Nm myoview ltd  07/30/2011    non-gated study seconadry to a fib;exercise capcity 7 mets--mildly reduced;low risk scan  . Aorta doppler  11/12/2012    normal abdomnal aortic duplex   . Transthoracic echocardiogram  11/12/12    afib; EF 50-55%    Social History:  reports that he quit smoking about 3 years ago. His smoking use included Pipe. He does not have any smokeless tobacco history on file. He reports that he does not drink alcohol. His drug history is not on file.  Allergies:  Allergies  Allergen Reactions  . Lipitor [Atorvastatin] Other (See Comments)    Leg cramps  . Lamisil [Terbinafine Hcl] Rash     Medications: No current facility-administered medications for this encounter.   Current Outpatient Prescriptions  Medication Sig Dispense Refill  . aspirin EC 81 MG tablet Take 2 tablets daily.    . BELSOMRA 10 MG TABS Take 1 tablet by mouth at bedtime as needed. Take 1 tab at bedtimefor sleep  2  . clopidogrel (PLAVIX) 75 MG tablet Take 1 tablet by mouth  daily with breakfast 90 tablet 3  . diclofenac (VOLTAREN) 75 MG EC tablet Take 75 mg by mouth daily.    [name] diltiazem (CARDIZEM SR) 120 MG 12 hr capsule Take 1 capsule (120 mg total) by mouth 2 (two) times daily. 180 capsule 2  . Multiple Vitamins-Minerals (MULTIVITAMIN WITH MINERALS) tablet Take 1 tablet by mouth daily.    . valsartan-hydrochlorothiazide (DIOVAN-HCT) 320-12.5 MG per tablet Take 1 tablet by mouth daily.      No results found for this or any previous visit (from the past 48 hour(s)). No results found.  ROS: Pain with rom of the left lower extremity  Physical Exam:  Alert and oriented 69 y.o. male in no acute distress Cranial nerves 2-12 intact Cervical spine: full rom with no tenderness, nv intact distally Chest: active breath sounds bilaterally, no wheeze rhonchi or rales Heart: regular rate and rhythm, no murmur Abd: non tender non distended with active bowel sounds Hip is stable with rom  Left knee with moderate crepitus with rom nv intact distally Quad avoidance gait  Assessment/Plan Assessment: left  knee endstage osteoarthritis  Plan: Patient will undergo a left total knee arthroplasty by Dr. Veverly Fells at [facility]. Risks benefits and expectations were discussed with the patient. Patient understand risks, benefits and expectations and wishes to proceed.

## 2015-06-23 NOTE — Pre-Procedure Instructions (Signed)
BRIANT ANGELILLO  06/23/2015      [id number] [place], [place] - [address] [place] [address] Phone: [phone] Fax: [phone]  [place] [address] [place] [address] [place] [address] Phone: [phone] Fax: [phone]    Your procedure is scheduled on Friday, July 07, 2015  Report to [facility] Admitting at 5:30 A.M.  Call this number if you have problems the morning of surgery:  [phone]   Remember:  Do not eat food or drink liquids after midnight Thursday, July 06, 2015  Take these medicines the morning of surgery with A SIP OF WATER:diltiazem [facility])  Stop taking Plavix and Aspirin as directed by your Dr. (1 week before  Surgery)   Do not wear jewelry, make-up or nail polish.  Do not wear lotions, powders, or perfumes.  You may not wear deodorant.  Do not shave 48 hours prior to surgery.  Men may shave face and neck.  Do not bring valuables to the hospital.  [facility] is not responsible for any belongings or valuables.  Contacts, dentures or bridgework may not be worn into surgery.  Leave your suitcase in the car.  After surgery it may be brought to your room.  For patients admitted to the hospital, discharge time will be determined by your treatment team.  Patients discharged the day of surgery will not be allowed to drive home.    Special instructions: [place] - Preparing for Surgery  Before surgery, you can play an important role.  Because skin is not sterile, your skin needs to be as free of germs as possible.  You can reduce the number of germs on you skin by washing with CHG (chlorahexidine gluconate) soap before surgery.  CHG is an antiseptic cleaner which kills germs and bonds with the skin to continue killing germs even after washing.  Please DO NOT use if you have an allergy to CHG or  antibacterial soaps.  If your skin becomes reddened/irritated stop using the CHG and inform your nurse when you arrive at Short Stay.  Do not shave (including legs and underarms) for at least 48 hours prior to the first CHG shower.  You may shave your face.  Please follow these instructions carefully:   1.  Shower with CHG Soap the night before surgery and the morning of Surgery.  2.  If you choose to wash your hair, wash your hair first as usual with your normal shampoo.  3.  After you shampoo, rinse your hair and body thoroughly to remove the Shampoo.  4.  Use CHG as you would any other liquid soap.  You can apply chg directly  to the skin and wash gently with scrungie or a clean washcloth.  5.  Apply the CHG Soap to your body ONLY FROM THE NECK DOWN.  Do not use on open wounds or open sores.  Avoid contact with your eyes, ears, mouth and genitals (private parts).  Wash genitals (private parts) with your normal soap.  6.  Wash thoroughly, paying special attention to the area where your surgery will be performed.  7.  Thoroughly rinse your body with warm water from the neck down.  8.  DO NOT shower/wash with your normal soap after using and rinsing off the CHG Soap.  9.  Pat yourself dry with a clean  towel.            10.  Wear clean pajamas.            11.  Place clean sheets on your bed the night of your first shower and do not sleep with pets.  Day of Surgery  Do not apply any lotions/deodorants the morning of surgery.  Please wear clean clothes to the hospital/surgery center.  Please read over the following fact sheets that you were given. Pain Booklet, Coughing and Deep Breathing, Blood Transfusion Information, Total Joint Packet, MRSA Information and Surgical Site Infection Prevention

## 2015-06-26 ENCOUNTER — Encounter (HOSPITAL_COMMUNITY): Payer: Self-pay

## 2015-06-26 ENCOUNTER — Encounter (HOSPITAL_COMMUNITY)
Admission: RE | Admit: 2015-06-26 | Discharge: 2015-06-26 | Disposition: A | Discharge status: Home / Self Care | Payer: Medicare Other | Source: Ambulatory Visit | Attending: Orthopedic Surgery | Admitting: Orthopedic Surgery

## 2015-06-26 ENCOUNTER — Telehealth: Payer: Self-pay | Admitting: Cardiology

## 2015-06-26 ENCOUNTER — Other Ambulatory Visit (HOSPITAL_COMMUNITY): Payer: Medicare Other

## 2015-06-26 DIAGNOSIS — Z0183 Encounter for blood typing: Secondary | ICD-10-CM | POA: Diagnosis not present

## 2015-06-26 DIAGNOSIS — Z7902 Long term (current) use of antithrombotics/antiplatelets: Secondary | ICD-10-CM | POA: Insufficient documentation

## 2015-06-26 DIAGNOSIS — Z01818 Encounter for other preprocedural examination: Secondary | ICD-10-CM | POA: Insufficient documentation

## 2015-06-26 DIAGNOSIS — I482 Chronic atrial fibrillation: Secondary | ICD-10-CM | POA: Insufficient documentation

## 2015-06-26 DIAGNOSIS — Z7982 Long term (current) use of aspirin: Secondary | ICD-10-CM | POA: Diagnosis not present

## 2015-06-26 DIAGNOSIS — M179 Osteoarthritis of knee, unspecified: Secondary | ICD-10-CM | POA: Insufficient documentation

## 2015-06-26 DIAGNOSIS — Z8589 Personal history of malignant neoplasm of other organs and systems: Secondary | ICD-10-CM | POA: Insufficient documentation

## 2015-06-26 DIAGNOSIS — Z87891 Personal history of nicotine dependence: Secondary | ICD-10-CM | POA: Diagnosis not present

## 2015-06-26 DIAGNOSIS — Z01812 Encounter for preprocedural laboratory examination: Secondary | ICD-10-CM | POA: Diagnosis not present

## 2015-06-26 DIAGNOSIS — Z79899 Other long term (current) drug therapy: Secondary | ICD-10-CM | POA: Diagnosis not present

## 2015-06-26 DIAGNOSIS — I10 Essential (primary) hypertension: Secondary | ICD-10-CM | POA: Diagnosis not present

## 2015-06-26 HISTORY — DX: Malignant (primary) neoplasm, unspecified: C80.1

## 2015-06-26 HISTORY — DX: Cardiac arrhythmia, unspecified: I49.9

## 2015-06-26 LAB — BASIC METABOLIC PANEL
ANION GAP: 8 (ref 5–15)
BUN: 18 mg/dL (ref 6–20)
CO2: 30 mmol/L (ref 22–32)
Calcium: 9.3 mg/dL (ref 8.9–10.3)
Chloride: 104 mmol/L (ref 101–111)
Creatinine, Ser: 1.06 mg/dL (ref 0.61–1.24)
GFR calc Af Amer: >60 mL/min (ref >60)
GLUCOSE: 112 mg/dL — AB (ref 65–99)
POTASSIUM: 3.8 mmol/L (ref 3.5–5.1)
Sodium: 142 mmol/L (ref 135–145)

## 2015-06-26 LAB — CBC
HCT: 45.2 % (ref 39.0–52.0)
Hemoglobin: 15.2 g/dL (ref 13.0–17.0)
MCH: 32.7 pg (ref 26.0–34.0)
MCHC: 33.6 g/dL (ref 30.0–36.0)
MCV: 97.2 fL (ref 78.0–100.0)
PLATELETS: 144 10*3/uL — AB (ref 150–400)
RBC: 4.65 MIL/uL (ref 4.22–5.81)
RDW: 12.2 % (ref 11.5–15.5)
WBC: 4.9 10*3/uL (ref 4.0–10.5)

## 2015-06-26 LAB — TYPE AND SCREEN
ABO/RH(D): AB POS
ANTIBODY SCREEN: NEGATIVE

## 2015-06-26 LAB — ABO/RH: ABO/RH(D): AB POS

## 2015-06-26 LAB — SURGICAL PCR SCREEN
MRSA, PCR: NEGATIVE
Staphylococcus aureus: POSITIVE — AB

## 2015-06-26 MED ORDER — DILTIAZEM HCL ER BEADS 240 MG PO CP24: 240.0000 mg | ORAL_CAPSULE | Freq: Every day | ORAL | Status: DC

## 2015-06-26 NOTE — Telephone Encounter (Signed)
Alex Alvarez is calling to get a new prescription sent to [facility] . Patient is changing from [facility] . Ditiazem CD 240 mg 24 hr 90 day suppy

## 2015-06-26 NOTE — Progress Notes (Signed)
Pt called and informed of results of PCR screen. [facility] [place]

## 2015-06-26 NOTE — Progress Notes (Signed)
PCP is Dr. Kathryne Eriksson Cardiologist is Dr. Johny Chess Pt reports he is to stop Plavix and aspirin 1 week before surgery. Pt states he had a sleep study done and he did not have sleep apnea. Echo noted in epic from 11-12-12  Stress test noted in epic from 07-30-11 Clearance not on chart.

## 2015-06-26 NOTE — Telephone Encounter (Signed)
Rx(s) sent to pharmacy electronically.  

## 2015-06-27 NOTE — Progress Notes (Signed)
Anesthesia Chart Review:  Pt is 69 year old male scheduled for L total knee arthroplasty on 07/07/2015 with Dr. Veverly Fells.   Cardiologist is Dr. Ellyn Hack, last office visit 04/02/15. PCP is Dr. Kathryne Eriksson.   PMH includes: chronic atrial fibrillation, HTN, heart murmur, salivary gland cancer. Former smoker. BMI 33.  Medications include: ASA, lipitor, belsomra, plavix, diltiazem, valsartan-hctz.   Preoperative labs reviewed.    EKG 03/31/2015: atrial fibrillation, HR 76  Echo 11/12/2012:  - Left ventricle: The cavity size was normal. Systolic function was normal. The estimated ejection fraction was in the range of 50% to 55%. Regional wall motion abnormalities cannot be excluded. The study is not technically sufficient to allow evaluation of LV diastolic function. - Aortic valve: Trivial regurgitation. - Mitral valve: Mild regurgitation. - Atrial septum: No defect or patent foramen ovale was identified.  Nuclear stress test 07/30/2011:  -abnormal myocardial perfusion scan demonstrating an attenuation defect in the infero-septal region of the myocardium in resting images only consistent with artifact. No ischemia or infarct/scar demonstrated. This is a low risk scna.   Pt has cardiac clearance from Dr. Ellyn Hack and medical clearance from Dr. Redmond Pulling for surgery.   If no changes, I anticipate pt can proceed with surgery as scheduled.   Willeen Cass, FNP-BC [facility] Short Stay Surgical Center/Anesthesiology Phone: [phone] 06/27/2015 2:41 PM

## 2015-07-03 ENCOUNTER — Other Ambulatory Visit: Payer: Self-pay | Admitting: Cardiology

## 2015-07-06 MED ORDER — CHLORHEXIDINE GLUCONATE 4 % EX LIQD: 60.0000 mL | Freq: Once | CUTANEOUS | Status: DC

## 2015-07-06 MED ORDER — CEFAZOLIN SODIUM-DEXTROSE 2-3 GM-% IV SOLR
2.0000 g | INTRAVENOUS | Status: AC
  Administered 2015-07-07: 2 g via INTRAVENOUS
  Filled 2015-07-06: qty 50

## 2015-07-07 ENCOUNTER — Encounter (HOSPITAL_COMMUNITY)
Admission: RE | Disposition: A | Discharge status: Home Health | Payer: Self-pay | Source: Ambulatory Visit | Attending: Orthopedic Surgery

## 2015-07-07 ENCOUNTER — Inpatient Hospital Stay (HOSPITAL_COMMUNITY)
Admission: RE | Admit: 2015-07-07 | Discharge: 2015-07-10 | DRG: 470 | Disposition: A | Discharge status: Home Health | Payer: Medicare Other | Source: Ambulatory Visit | Attending: Orthopedic Surgery | Admitting: Orthopedic Surgery

## 2015-07-07 ENCOUNTER — Inpatient Hospital Stay (HOSPITAL_COMMUNITY): Payer: Medicare Other | Admitting: Anesthesiology

## 2015-07-07 ENCOUNTER — Inpatient Hospital Stay (HOSPITAL_COMMUNITY): Payer: Medicare Other

## 2015-07-07 ENCOUNTER — Encounter (HOSPITAL_COMMUNITY): Payer: Self-pay | Admitting: *Deleted

## 2015-07-07 ENCOUNTER — Inpatient Hospital Stay (HOSPITAL_COMMUNITY): Payer: Medicare Other | Admitting: Emergency Medicine

## 2015-07-07 DIAGNOSIS — Z7902 Long term (current) use of antithrombotics/antiplatelets: Secondary | ICD-10-CM | POA: Diagnosis not present

## 2015-07-07 DIAGNOSIS — Z791 Long term (current) use of non-steroidal anti-inflammatories (NSAID): Secondary | ICD-10-CM

## 2015-07-07 DIAGNOSIS — Z79899 Other long term (current) drug therapy: Secondary | ICD-10-CM | POA: Diagnosis not present

## 2015-07-07 DIAGNOSIS — Z87891 Personal history of nicotine dependence: Secondary | ICD-10-CM

## 2015-07-07 DIAGNOSIS — Z6830 Body mass index (BMI) 30.0-30.9, adult: Secondary | ICD-10-CM | POA: Diagnosis not present

## 2015-07-07 DIAGNOSIS — Z7982 Long term (current) use of aspirin: Secondary | ICD-10-CM

## 2015-07-07 DIAGNOSIS — I10 Essential (primary) hypertension: Secondary | ICD-10-CM | POA: Diagnosis present

## 2015-07-07 DIAGNOSIS — Z96652 Presence of left artificial knee joint: Secondary | ICD-10-CM

## 2015-07-07 DIAGNOSIS — E669 Obesity, unspecified: Secondary | ICD-10-CM | POA: Diagnosis present

## 2015-07-07 DIAGNOSIS — Z96659 Presence of unspecified artificial knee joint: Secondary | ICD-10-CM

## 2015-07-07 DIAGNOSIS — M25562 Pain in left knee: Secondary | ICD-10-CM | POA: Diagnosis present

## 2015-07-07 DIAGNOSIS — M1712 Unilateral primary osteoarthritis, left knee: Secondary | ICD-10-CM | POA: Diagnosis present

## 2015-07-07 HISTORY — PX: JOINT REPLACEMENT: SHX530

## 2015-07-07 HISTORY — PX: TOTAL KNEE ARTHROPLASTY: SHX125

## 2015-07-07 LAB — CREATININE, SERUM
CREATININE: 1.4 mg/dL — AB (ref 0.61–1.24)
GFR calc Af Amer: 57 mL/min — ABNORMAL LOW (ref >60)
GFR, EST NON AFRICAN AMERICAN: 49 mL/min — AB (ref >60)

## 2015-07-07 LAB — CBC
HCT: 41.3 % (ref 39.0–52.0)
Hemoglobin: 13.6 g/dL (ref 13.0–17.0)
MCH: 32.4 pg (ref 26.0–34.0)
MCHC: 32.9 g/dL (ref 30.0–36.0)
MCV: 98.3 fL (ref 78.0–100.0)
PLATELETS: 129 10*3/uL — AB (ref 150–400)
RBC: 4.2 MIL/uL — ABNORMAL LOW (ref 4.22–5.81)
RDW: 12.2 % (ref 11.5–15.5)
WBC: 10.7 10*3/uL — ABNORMAL HIGH (ref 4.0–10.5)

## 2015-07-07 SURGERY — ARTHROPLASTY, KNEE, TOTAL
Anesthesia: Regional | Site: Knee | Laterality: Left

## 2015-07-07 MED ORDER — ATORVASTATIN CALCIUM 40 MG PO TABS: 40.0000 mg | ORAL_TABLET | Freq: Every day | ORAL | Status: DC

## 2015-07-07 MED ORDER — POLYETHYLENE GLYCOL 3350 17 G PO PACK: 17.0000 g | PACK | Freq: Every day | ORAL | Status: DC | PRN

## 2015-07-07 MED ORDER — ONDANSETRON HCL 4 MG PO TABS: 4.0000 mg | ORAL_TABLET | Freq: Four times a day (QID) | ORAL | Status: DC | PRN

## 2015-07-07 MED ORDER — WARFARIN SODIUM 5 MG PO TABS: 5.0000 mg | ORAL_TABLET | Freq: Once | ORAL | Status: DC

## 2015-07-07 MED ORDER — DOCUSATE SODIUM 100 MG PO CAPS
100.0000 mg | ORAL_CAPSULE | Freq: Two times a day (BID) | ORAL | Status: DC
  Administered 2015-07-07 – 2015-07-10 (×6): 100 mg via ORAL
  Filled 2015-07-07 (×6): qty 1

## 2015-07-07 MED ORDER — COUMADIN BOOK
Freq: Once | Status: DC
  Filled 2015-07-07: qty 1

## 2015-07-07 MED ORDER — BISACODYL 10 MG RE SUPP: 10.0000 mg | Freq: Every day | RECTAL | Status: DC | PRN

## 2015-07-07 MED ORDER — PROPOFOL 10 MG/ML IV BOLUS
INTRAVENOUS | Status: AC
  Filled 2015-07-07: qty 20

## 2015-07-07 MED ORDER — MIDAZOLAM HCL 5 MG/5ML IJ SOLN
INTRAMUSCULAR | Status: DC | PRN
Start: 2015-07-07 — End: 2015-07-07
  Administered 2015-07-07 (×2): 1 mg via INTRAVENOUS

## 2015-07-07 MED ORDER — ACETAMINOPHEN 650 MG RE SUPP: 650.0000 mg | Freq: Four times a day (QID) | RECTAL | Status: DC | PRN

## 2015-07-07 MED ORDER — DICLOFENAC SODIUM 75 MG PO TBEC
75.0000 mg | DELAYED_RELEASE_TABLET | Freq: Every day | ORAL | Status: DC
  Administered 2015-07-08 – 2015-07-10 (×3): 75 mg via ORAL
  Filled 2015-07-07 (×4): qty 1

## 2015-07-07 MED ORDER — METOCLOPRAMIDE HCL 5 MG PO TABS: 5.0000 mg | ORAL_TABLET | Freq: Three times a day (TID) | ORAL | Status: DC | PRN

## 2015-07-07 MED ORDER — ZOLPIDEM TARTRATE 5 MG PO TABS: 10.0000 mg | ORAL_TABLET | Freq: Every evening | ORAL | Status: DC | PRN

## 2015-07-07 MED ORDER — METHOCARBAMOL 500 MG PO TABS: 500.0000 mg | ORAL_TABLET | Freq: Three times a day (TID) | ORAL | Status: DC | PRN

## 2015-07-07 MED ORDER — HYDROCHLOROTHIAZIDE 12.5 MG PO CAPS
12.5000 mg | ORAL_CAPSULE | Freq: Every day | ORAL | Status: DC
  Administered 2015-07-08 – 2015-07-10 (×3): 12.5 mg via ORAL
  Filled 2015-07-07 (×3): qty 1

## 2015-07-07 MED ORDER — HYDROMORPHONE HCL 1 MG/ML IJ SOLN: 0.2500 mg | INTRAMUSCULAR | Status: DC | PRN

## 2015-07-07 MED ORDER — ENOXAPARIN SODIUM 30 MG/0.3ML ~~LOC~~ SOLN
30.0000 mg | Freq: Two times a day (BID) | SUBCUTANEOUS | Status: DC
  Administered 2015-07-08 – 2015-07-10 (×5): 30 mg via SUBCUTANEOUS
  Filled 2015-07-07 (×5): qty 0.3

## 2015-07-07 MED ORDER — MENTHOL 3 MG MT LOZG: 1.0000 | LOZENGE | OROMUCOSAL | Status: DC | PRN

## 2015-07-07 MED ORDER — WARFARIN - PHARMACIST DOSING INPATIENT
Freq: Every day | Status: DC
  Administered 2015-07-08: 19:00:00

## 2015-07-07 MED ORDER — WARFARIN SODIUM 5 MG PO TABS: 5.0000 mg | ORAL_TABLET | Freq: Every day | ORAL | Status: DC

## 2015-07-07 MED ORDER — MORPHINE SULFATE (PF) 2 MG/ML IV SOLN: 2.0000 mg | INTRAVENOUS | Status: DC | PRN

## 2015-07-07 MED ORDER — ACETAMINOPHEN 325 MG PO TABS
650.0000 mg | ORAL_TABLET | Freq: Four times a day (QID) | ORAL | Status: DC | PRN
  Administered 2015-07-07 – 2015-07-09 (×3): 650 mg via ORAL
  Filled 2015-07-07 (×3): qty 2

## 2015-07-07 MED ORDER — ROPIVACAINE HCL 5 MG/ML IJ SOLN
INTRAMUSCULAR | Status: DC | PRN
  Administered 2015-07-07: 30 mL via PERINEURAL

## 2015-07-07 MED ORDER — BUPIVACAINE IN DEXTROSE 0.75-8.25 % IT SOLN
INTRATHECAL | Status: DC | PRN
  Administered 2015-07-07: 15 mg via INTRATHECAL

## 2015-07-07 MED ORDER — ONDANSETRON HCL 4 MG/2ML IJ SOLN
INTRAMUSCULAR | Status: DC | PRN
  Administered 2015-07-07: 4 mg via INTRAVENOUS

## 2015-07-07 MED ORDER — CEFAZOLIN SODIUM-DEXTROSE 2-3 GM-% IV SOLR
2.0000 g | Freq: Four times a day (QID) | INTRAVENOUS | Status: AC
  Administered 2015-07-07: 2 g via INTRAVENOUS
  Filled 2015-07-07 (×2): qty 50

## 2015-07-07 MED ORDER — OXYCODONE-ACETAMINOPHEN 5-325 MG PO TABS: 1.0000 | ORAL_TABLET | ORAL | Status: DC | PRN

## 2015-07-07 MED ORDER — FENTANYL CITRATE (PF) 250 MCG/5ML IJ SOLN
INTRAMUSCULAR | Status: DC | PRN
  Administered 2015-07-07: 50 ug via INTRAVENOUS

## 2015-07-07 MED ORDER — PROPOFOL 500 MG/50ML IV EMUL
INTRAVENOUS | Status: DC | PRN
  Administered 2015-07-07: 50 ug/kg/min via INTRAVENOUS

## 2015-07-07 MED ORDER — SODIUM CHLORIDE 0.9 % IV SOLN: INTRAVENOUS | Status: DC

## 2015-07-07 MED ORDER — ADULT MULTIVITAMIN W/MINERALS CH
1.0000 | ORAL_TABLET | Freq: Every day | ORAL | Status: DC
  Administered 2015-07-08 – 2015-07-10 (×3): 1 via ORAL
  Filled 2015-07-07 (×3): qty 1

## 2015-07-07 MED ORDER — ONDANSETRON HCL 4 MG/2ML IJ SOLN: 4.0000 mg | Freq: Four times a day (QID) | INTRAMUSCULAR | Status: DC | PRN

## 2015-07-07 MED ORDER — FERROUS SULFATE 325 (65 FE) MG PO TABS
325.0000 mg | ORAL_TABLET | Freq: Three times a day (TID) | ORAL | Status: DC
  Administered 2015-07-08 – 2015-07-10 (×7): 325 mg via ORAL
  Filled 2015-07-07 (×7): qty 1

## 2015-07-07 MED ORDER — ASPIRIN EC 81 MG PO TBEC
81.0000 mg | DELAYED_RELEASE_TABLET | Freq: Once | ORAL | Status: DC
  Filled 2015-07-07: qty 1

## 2015-07-07 MED ORDER — PHENOL 1.4 % MT LIQD: 1.0000 | OROMUCOSAL | Status: DC | PRN

## 2015-07-07 MED ORDER — METOCLOPRAMIDE HCL 5 MG/ML IJ SOLN: 5.0000 mg | Freq: Three times a day (TID) | INTRAMUSCULAR | Status: DC | PRN

## 2015-07-07 MED ORDER — METHOCARBAMOL 1000 MG/10ML IJ SOLN
500.0000 mg | Freq: Four times a day (QID) | INTRAVENOUS | Status: DC | PRN
  Filled 2015-07-07: qty 5

## 2015-07-07 MED ORDER — IRBESARTAN 300 MG PO TABS
300.0000 mg | ORAL_TABLET | Freq: Every day | ORAL | Status: DC
  Administered 2015-07-08 – 2015-07-10 (×3): 300 mg via ORAL
  Filled 2015-07-07 (×3): qty 1

## 2015-07-07 MED ORDER — SODIUM CHLORIDE 0.9 % IR SOLN
Status: DC | PRN
  Administered 2015-07-07: 3000 mL

## 2015-07-07 MED ORDER — OXYCODONE HCL 5 MG PO TABS
5.0000 mg | ORAL_TABLET | ORAL | Status: DC | PRN
  Administered 2015-07-07 – 2015-07-09 (×6): 10 mg via ORAL
  Filled 2015-07-07 (×6): qty 2

## 2015-07-07 MED ORDER — MIDAZOLAM HCL 2 MG/2ML IJ SOLN
INTRAMUSCULAR | Status: AC
  Filled 2015-07-07: qty 4

## 2015-07-07 MED ORDER — DILTIAZEM HCL ER BEADS 240 MG PO CP24
240.0000 mg | ORAL_CAPSULE | Freq: Every day | ORAL | Status: DC
Start: 2015-07-07 — End: 2015-07-10
  Administered 2015-07-08 – 2015-07-10 (×3): 240 mg via ORAL
  Filled 2015-07-07 (×7): qty 1

## 2015-07-07 MED ORDER — 0.9 % SODIUM CHLORIDE (POUR BTL) OPTIME
TOPICAL | Status: DC | PRN
  Administered 2015-07-07: 1000 mL

## 2015-07-07 MED ORDER — FENTANYL CITRATE (PF) 250 MCG/5ML IJ SOLN
INTRAMUSCULAR | Status: AC
  Filled 2015-07-07: qty 5

## 2015-07-07 MED ORDER — VALSARTAN-HYDROCHLOROTHIAZIDE 320-12.5 MG PO TABS: 1.0000 | ORAL_TABLET | Freq: Every day | ORAL | Status: DC

## 2015-07-07 MED ORDER — ATORVASTATIN CALCIUM 10 MG PO TABS
10.0000 mg | ORAL_TABLET | Freq: Every day | ORAL | Status: DC
  Administered 2015-07-08 – 2015-07-09 (×2): 10 mg via ORAL
  Filled 2015-07-07 (×2): qty 1

## 2015-07-07 MED ORDER — LACTATED RINGERS IV SOLN
INTRAVENOUS | Status: DC | PRN
  Administered 2015-07-07 (×2): via INTRAVENOUS

## 2015-07-07 MED ORDER — METHOCARBAMOL 500 MG PO TABS: 500.0000 mg | ORAL_TABLET | Freq: Four times a day (QID) | ORAL | Status: DC | PRN

## 2015-07-07 SURGICAL SUPPLY — 57 items
BANDAGE ESMARK 6X9 LF (GAUZE/BANDAGES/DRESSINGS) ×1 IMPLANT
BLADE SAG 18X100X1.27 (BLADE) ×2 IMPLANT
BLADE SAW SGTL 13.0X1.19X90.0M (BLADE) ×2 IMPLANT
BNDG CMPR 9X6 STRL LF SNTH (GAUZE/BANDAGES/DRESSINGS) ×1
BNDG CMPR MED 10X6 ELC LF (GAUZE/BANDAGES/DRESSINGS) ×1
BNDG ELASTIC 6X10 VLCR STRL LF (GAUZE/BANDAGES/DRESSINGS) ×2 IMPLANT
BNDG ESMARK 6X9 LF (GAUZE/BANDAGES/DRESSINGS) ×2
BNDG GAUZE ELAST 4 BULKY (GAUZE/BANDAGES/DRESSINGS) ×4 IMPLANT
BOWL SMART MIX CTS (DISPOSABLE) ×2 IMPLANT
CAP KNEE TOTAL 3 SIGMA ×1 IMPLANT
CEMENT HV SMART SET (Cement) ×4 IMPLANT
COVER SURGICAL LIGHT HANDLE (MISCELLANEOUS) ×2 IMPLANT
CUFF TOURNIQUET SINGLE 34IN LL (TOURNIQUET CUFF) IMPLANT
CUFF TOURNIQUET SINGLE 44IN (TOURNIQUET CUFF) IMPLANT
DRAPE EXTREMITY T 121X128X90 (DRAPE) ×2 IMPLANT
DRAPE IMP U-DRAPE 54X76 (DRAPES) ×2 IMPLANT
DRAPE PROXIMA HALF (DRAPES) ×2 IMPLANT
DRAPE U-SHAPE 47X51 STRL (DRAPES) ×2 IMPLANT
DRSG ADAPTIC 3X8 NADH LF (GAUZE/BANDAGES/DRESSINGS) ×2 IMPLANT
DRSG PAD ABDOMINAL 8X10 ST (GAUZE/BANDAGES/DRESSINGS) ×2 IMPLANT
DURAPREP 26ML APPLICATOR (WOUND CARE) ×2 IMPLANT
ELECT CAUTERY BLADE 6.4 (BLADE) ×2 IMPLANT
ELECT REM PT RETURN 9FT ADLT (ELECTROSURGICAL) ×2
ELECTRODE REM PT RTRN 9FT ADLT (ELECTROSURGICAL) ×1 IMPLANT
GAUZE SPONGE 4X4 12PLY STRL (GAUZE/BANDAGES/DRESSINGS) ×2 IMPLANT
GLOVE BIOGEL PI ORTHO PRO 7.5 (GLOVE) ×1
GLOVE BIOGEL PI ORTHO PRO SZ8 (GLOVE) ×1
GLOVE ORTHO TXT STRL SZ7.5 (GLOVE) ×2 IMPLANT
GLOVE PI ORTHO PRO STRL 7.5 (GLOVE) ×1 IMPLANT
GLOVE PI ORTHO PRO STRL SZ8 (GLOVE) ×1 IMPLANT
GLOVE SURG ORTHO 8.5 STRL (GLOVE) ×2 IMPLANT
GOWN STRL REUS W/ TWL XL LVL3 (GOWN DISPOSABLE) ×3 IMPLANT
GOWN STRL REUS W/TWL XL LVL3 (GOWN DISPOSABLE) ×4
HANDPIECE INTERPULSE COAX TIP (DISPOSABLE) ×2
IMMOBILIZER KNEE 22 UNIV (SOFTGOODS) ×1 IMPLANT
KIT BASIN OR (CUSTOM PROCEDURE TRAY) ×2 IMPLANT
KIT MANIFOLD (MISCELLANEOUS) ×1 IMPLANT
KIT ROOM TURNOVER OR (KITS) ×2 IMPLANT
MANIFOLD NEPTUNE II (INSTRUMENTS) ×2 IMPLANT
NS IRRIG 1000ML POUR BTL (IV SOLUTION) ×2 IMPLANT
PACK TOTAL JOINT (CUSTOM PROCEDURE TRAY) ×2 IMPLANT
PACK UNIVERSAL I (CUSTOM PROCEDURE TRAY) ×2 IMPLANT
PAD ARMBOARD 7.5X6 YLW CONV (MISCELLANEOUS) ×3 IMPLANT
SET HNDPC FAN SPRY TIP SCT (DISPOSABLE) ×1 IMPLANT
STRIP CLOSURE SKIN 1/2X4 (GAUZE/BANDAGES/DRESSINGS) ×4 IMPLANT
SUCTION FRAZIER TIP 10 FR DISP (SUCTIONS) ×2 IMPLANT
SUT MNCRL AB 3-0 PS2 18 (SUTURE) ×2 IMPLANT
SUT VIC AB 0 CT1 27 (SUTURE) ×4
SUT VIC AB 0 CT1 27XBRD ANBCTR (SUTURE) ×2 IMPLANT
SUT VIC AB 1 CT1 27 (SUTURE) ×6
SUT VIC AB 1 CT1 27XBRD ANBCTR (SUTURE) ×3 IMPLANT
SUT VIC AB 2-0 CT1 27 (SUTURE) ×4
SUT VIC AB 2-0 CT1 TAPERPNT 27 (SUTURE) ×2 IMPLANT
TOWEL OR 17X24 6PK STRL BLUE (TOWEL DISPOSABLE) ×2 IMPLANT
TOWEL OR 17X26 10 PK STRL BLUE (TOWEL DISPOSABLE) ×2 IMPLANT
TRAY FOLEY CATH 16FRSI W/METER (SET/KITS/TRAYS/PACK) ×1 IMPLANT
WATER STERILE IRR 1000ML POUR (IV SOLUTION) ×2 IMPLANT

## 2015-07-07 NOTE — Anesthesia Procedure Notes (Addendum)
Anesthesia Regional Block:  Femoral nerve block  Pre-Anesthetic Checklist: ,, timeout performed, Correct Patient, Correct Site, Correct Laterality, Correct Procedure, Correct Position, site marked, Risks and benefits discussed, pre-op evaluation,  At surgeon's request and post-op pain management  Laterality: Left  Prep: Maximum Sterile Barrier Precautions used and chloraprep       Needles:  Injection technique: Single-shot  Needle Type: Echogenic Stimulator Needle     Needle Length: 5cm 5 cm Needle Gauge: 22 and 22 G    Additional Needles:  Procedures: ultrasound guided (picture in chart) Femoral nerve block  Nerve Stimulator or Paresthesia:  Response: Patellar respose,   Additional Responses:   Narrative:  Start time: 07/07/2015 7:05 AM End time: 07/07/2015 7:15 AM Injection made incrementally with aspirations every 5 mL. Anesthesiologist: Roderic Palau  Additional Notes: 2% Lidocaine skin wheel.    Spinal Patient location during procedure: OR Start time: 07/07/2015 7:42 AM End time: 07/07/2015 7:46 AM Staffing Anesthesiologist: Roderic Palau Performed by: anesthesiologist  Preanesthetic Checklist Completed: patient identified, surgical consent, pre-op evaluation, timeout performed, IV checked, risks and benefits discussed and monitors and equipment checked Spinal Block Patient position: sitting Prep: DuraPrep Patient monitoring: cardiac monitor, continuous pulse ox and blood pressure Approach: midline Location: L3-4 Injection technique: single-shot Needle Needle type: Pencan  Needle gauge: 24 G Needle length: 9 cm Assessment Sensory level: T6 Additional Notes Functioning IV was confirmed and monitors were applied. Sterile prep and drape, including hand hygiene and sterile gloves were used. The patient was positioned and the spine was prepped. The skin was anesthetized with lidocaine.  Free flow of clear CSF was obtained prior to injecting local  anesthetic into the CSF.  The spinal needle aspirated freely following injection.  The needle was carefully withdrawn.  The patient tolerated the procedure well.

## 2015-07-07 NOTE — Anesthesia Postprocedure Evaluation (Signed)
  Anesthesia Post-op Note  Patient: Alex Alvarez  Procedure(s) Performed: Procedure(s): LEFT TOTAL KNEE ARTHROPLASTY (Left)  Patient Location: PACU  Anesthesia Type: Spinal/MAC with block  Level of Consciousness: awake and alert   Airway and Oxygen Therapy: Patient Spontanous Breathing  Post-op Pain: Controlled  Post-op Assessment: Post-op Vital signs reviewed, Patient's Cardiovascular Status Stable and Respiratory Function Stable. Block receeding.  Post-op Vital Signs: Reviewed  Filed Vitals:   07/07/15 1107  BP: 108/76  Pulse: 94  Temp:   Resp: 17    Complications: No apparent anesthesia complications

## 2015-07-07 NOTE — Evaluation (Signed)
Physical Therapy Evaluation Patient Details Name: Alex Alvarez MRN: [id number] DOB: 03/15/1946 Today's Date: 07/07/2015   History of Present Illness  Pt admitted for Left TKA. 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 Pt and wife educated for knee precaution, KI use and plan. Pt able to get to EOB without difficulty but noted dizziness in sitting. Performed seated exercises and increased time EOB. With pivot to chair pt with syncopal event in chair and immediately reclined. RN made aware and present in room. No significant rise in BP with reclined position and noted bradycardia with request for tele box made which confirmed bradycardia HR 38-44 and pt required 4L O2 for sats to maintain 97%. Cues for breathing technique and family educated throughout for pt cardiopulmonary status. With fluids increased by RN, O2, and additional time in reclined position pt without increased in cardiopulmonary status. Rapid response nurse in room. Used maximove to transfer pt chair to bed and pt in [place] position with RN and rapid response end of session.  Pt with decreased strength with difficulty maintaining a LLE quad contraction at this time as well as decreased mobility, gait and decreased cardiopulmonary function who will benefit from acute therapy to maximize mobility and independence.   BP in chair initially 73/53 (61), HR 38, sats 88% on 2L BP in chair after 4 min reclined 80/54 (62), HR 43, sats 94% on 4L 1342 BP 68/47 (55), HR 44 1352 once returned to bed in [place] BP 71/52 (60), HR 37    Follow Up Recommendations Home health PT    Equipment Recommendations  Rolling walker with 5" wheels    Recommendations for Other Services OT consult     Precautions / Restrictions Precautions Precautions: Fall;Knee Required Braces or Orthoses: Knee Immobilizer - Left Knee Immobilizer - Left: On when  out of bed or walking Restrictions Weight Bearing Restrictions: Yes LLE Weight Bearing: Weight bearing as tolerated      Mobility  Bed Mobility Overal bed mobility: Needs Assistance Bed Mobility: Supine to Sit;Sit to Supine     Supine to sit: Min guard Sit to supine: Total assist;+2 for physical assistance   General bed mobility comments: cues for sequence and use of rails to pivot to EOB on sitting. Pt returned to supine with maxi move secondary to cardiopulmonary status  Transfers Overall transfer level: Needs assistance   Transfers: Sit to/from Stand;Stand Pivot Transfers Sit to Stand: Min assist Stand pivot transfers: Mod assist;+2 physical assistance;+2 safety/equipment       General transfer comment: pt stood well from bed with cues for hand and foot placement then returned to sitting due to dizziness. pt stood again after seated exercises when dizziness decreased and pivoted to chair with 2 person assist for safety with RW. Once in sitting pt with syncopal episode and reclined in chair. Pt with maintained hypotension and bradycardia in chair after 18 min and returned to bed via maximove.  Ambulation/Gait                Stairs            Wheelchair Mobility    Modified Rankin (Stroke Patients Only)       Balance Overall balance assessment: Needs assistance   Sitting balance-Leahy Scale: Fair       Standing balance-Leahy Scale: Poor  Pertinent Vitals/Pain Pain Assessment: 0-10 Pain Score: 4  Pain Location: left thigh Pain Descriptors / Indicators: Cramping Pain Intervention(s): Premedicated before session;Repositioned;Monitored during session;Limited activity within patient's tolerance    Home Living Family/patient expects to be discharged to:: Private residence Living Arrangements: Spouse/significant other Available Help at Discharge: Family;Friend(s);Available PRN/intermittently Type of Home:  House Home Access: Ramped entrance     Home Layout: One level Home Equipment: Shower seat;Bedside commode Additional Comments: Wife is in a WC and pt normally helps her with mobility and ADLs    Prior Function Level of Independence: Independent               Hand Dominance        Extremity/Trunk Assessment   Upper Extremity Assessment: Overall WFL for tasks assessed           Lower Extremity Assessment: LLE deficits/detail   LLE Deficits / Details: decreased strength and ROM as expected post op  Cervical / Trunk Assessment: Normal  Communication   Communication: No difficulties  Cognition Arousal/Alertness: Awake/alert Behavior During Therapy: WFL for tasks assessed/performed Overall Cognitive Status: Within Functional Limits for tasks assessed                      General Comments      Exercises Total Joint Exercises Quad Sets: AROM;Left;5 reps;Supine Heel Slides: AROM;Left;5 reps;Supine      Assessment/Plan    PT Assessment Patient needs continued PT services  PT Diagnosis Difficulty walking   PT Problem List Decreased strength;Decreased range of motion;Decreased activity tolerance;Decreased balance;Decreased mobility;Cardiopulmonary status limiting activity;Decreased knowledge of use of DME  PT Treatment Interventions DME instruction;Gait training;Functional mobility training;Therapeutic activities;Therapeutic exercise;Patient/family education   PT Goals (Current goals can be found in the Care Plan section) Acute Rehab PT Goals Patient Stated Goal: return home to care for wife & yardwork  PT Goal Formulation: With patient/family Time For Goal Achievement: 07/14/15 Potential to Achieve Goals: Good    Frequency 7X/week   Barriers to discharge Decreased caregiver support      Co-evaluation               End of Session Equipment Utilized During Treatment: Gait belt Activity Tolerance: Treatment limited secondary to medical  complications (Comment) Patient left: in bed;with call bell/phone within reach;with nursing/sitter in room;with family/visitor present Nurse Communication: Mobility status;Precautions;Weight bearing status         Time: 2951-8841 PT Time Calculation (min) (ACUTE ONLY): 48 min   Charges:   PT Evaluation $Initial PT Evaluation Tier I: 1 Procedure PT Treatments $Therapeutic Activity: 8-22 mins   PT G CodesMelford Aase 07/07/2015, 2:05 PM Elwyn Reach, [place]

## 2015-07-07 NOTE — Transfer of Care (Signed)
Immediate Anesthesia Transfer of Care Note  Patient: Alex Alvarez  Procedure(s) Performed: Procedure(s): LEFT TOTAL KNEE ARTHROPLASTY (Left)  Patient Location: PACU  Anesthesia Type:Regional and Spinal  Level of Consciousness: awake, alert  and oriented  Airway & Oxygen Therapy: Patient Spontanous Breathing and Patient connected to nasal cannula oxygen  Post-op Assessment: Report given to RN and Post -op Vital signs reviewed and stable  Post vital signs: Reviewed and stable  Last Vitals:  Filed Vitals:   07/07/15 0620  BP: 150/99  Pulse: 90  Temp: 36.4 C  Resp: 18    Complications: No apparent anesthesia complications

## 2015-07-07 NOTE — Anesthesia Preprocedure Evaluation (Addendum)
Anesthesia Evaluation  Patient identified by MRN, date of birth, ID band Patient awake    Reviewed: Allergy & Precautions, H&P , NPO status , Patient's Chart, lab work & pertinent test results  Airway Mallampati: III  TM Distance: >3 FB Neck ROM: Full    Dental no notable dental hx. (+) Teeth Intact, Dental Advisory Given   Pulmonary neg pulmonary ROS, former smoker,    Pulmonary exam normal breath sounds clear to auscultation       Cardiovascular hypertension, Pt. on medications + Peripheral Vascular Disease  + dysrhythmias Atrial Fibrillation  Rhythm:Regular Rate:Normal     Neuro/Psych negative neurological ROS  negative psych ROS   GI/Hepatic negative GI ROS, Neg liver ROS,   Endo/Other  negative endocrine ROS  Renal/GU negative Renal ROS  negative genitourinary   Musculoskeletal   Abdominal   Peds  Hematology negative hematology ROS (+)   Anesthesia Other Findings   Reproductive/Obstetrics negative OB ROS                            Anesthesia Physical Anesthesia Plan  ASA: III  Anesthesia Plan: Spinal and Regional   Post-op Pain Management: MAC Combined w/ Regional for Post-op pain   Induction: Intravenous  Airway Management Planned: Simple Face Mask  Additional Equipment:   Intra-op Plan:   Post-operative Plan:   Informed Consent: I have reviewed the patients History and Physical, chart, labs and discussed the procedure including the risks, benefits and alternatives for the proposed anesthesia with the patient or authorized representative who has indicated his/her understanding and acceptance.   Dental advisory given  Plan Discussed with: CRNA and Surgeon  Anesthesia Plan Comments:        Anesthesia Quick Evaluation

## 2015-07-07 NOTE — Progress Notes (Signed)
Orthopedic Tech Progress Note Patient Details:  Alex Alvarez 1946-04-07 [id number]  CPM Left Knee CPM Left Knee: On Left Knee Flexion (Degrees): 90 Left Knee Extension (Degrees): 0 Additional Comments: trapeze bar patient helper Viewed order list from doctor's order list  Hildred Priest 07/07/2015, 10:36 AM

## 2015-07-07 NOTE — Discharge Instructions (Signed)
Ice to the knee as much as possible.  Ok to [employer] as tolerated on the left knee/leg.  Use the CPM at home for 6 hours per day in two hour sessions.  0-90 degrees and progress  Do exercises every hour at home while awake.  Do not prop anything under the knee, prop under the ankle or calf to encourage extension.  Follow up in two weeks in the office  545--5000

## 2015-07-07 NOTE — Interval H&P Note (Signed)
History and Physical Interval Note:  07/07/2015 7:29 AM  Alex Alvarez  has presented today for surgery, with the diagnosis of LEFT KNEE ENSTAGE OSTEOARTHRITIS  The various methods of treatment have been discussed with the patient and family. After consideration of risks, benefits and other options for treatment, the patient has consented to  Procedure(s): LEFT TOTAL KNEE ARTHROPLASTY (Left) as a surgical intervention .  The patient's history has been reviewed, patient examined, no change in status, stable for surgery.  I have reviewed the patient's chart and labs.  Questions were answered to the patient's satisfaction.     NORRIS,STEVEN R

## 2015-07-07 NOTE — Brief Op Note (Signed)
07/07/2015  9:45 AM  PATIENT:  Alex Alvarez  69 y.o. male  PRE-OPERATIVE DIAGNOSIS:  LEFT KNEE END STAGE OSTEOARTHRITIS  POST-OPERATIVE DIAGNOSIS:  LEFT KNEE END STAGE OSTEOARTHRITIS  PROCEDURE:  Procedure(s): LEFT TOTAL KNEE ARTHROPLASTY (Left) [employer] RP  SURGEON:  Surgeon(s) and Role:    * Netta Cedars, MD - Primary  PHYSICIAN ASSISTANT:   ASSISTANTS: Ventura Bruns, PA-C   ANESTHESIA:   Femoral block and spinal  EBL:  Total I/O In: 1000 [I.V.:1000] Out: 125 [Urine:125]  BLOOD ADMINISTERED:none  DRAINS: none   LOCAL MEDICATIONS USED:  NONE  SPECIMEN:  No Specimen  DISPOSITION OF SPECIMEN:  N/A  COUNTS:  YES  TOURNIQUET:   Total Tourniquet Time Documented: Thigh (Left) - 97 minutes Total: Thigh (Left) - 97 minutes   DICTATION: .Other Dictation: Dictation Number [id number]  PLAN OF CARE: Admit to inpatient   PATIENT DISPOSITION:  PACU - hemodynamically stable.   Delay start of Pharmacological VTE agent (>24hrs) due to surgical blood loss or risk of bleeding: no

## 2015-07-07 NOTE — Progress Notes (Addendum)
ANTICOAGULATION CONSULT NOTE - Initial Consult  Pharmacy Consult for warfarin Indication: VTE prophylaxis  Allergies  Allergen Reactions  . Lipitor [Atorvastatin] Other (See Comments)    Leg cramps  . Lamisil [Terbinafine Hcl] Rash    Patient Measurements: Weight: 237 lb (107.502 kg)   Vital Signs: Temp: 97.4 F (36.3 C) (11/04 1230) Temp Source: Axillary (11/04 1230) BP: 114/87 mmHg (11/04 1230) Pulse Rate: 46 (11/04 1230)  Labs: No results for input(s): HGB, HCT, PLT, APTT, LABPROT, INR, HEPARINUNFRC, CREATININE, CKTOTAL, CKMB, TROPONINI in the last 72 hours.  Estimated Creatinine Clearance: 80.9 mL/min (by C-G formula based on Cr of 1.06).   Assessment: 60 YOM s/p left TKA for end stage osteoarthritis. To start warfarin for VTE prophylaxis post-op.  Pre-op Hgb 15.2, plts 144. No excessive bleeding noted intra-op. Also on Lovenox 30mg  subQ q12h- can continue until INR is therapeutic. Goal of Therapy:  INR 2-3 Monitor platelets by anticoagulation protocol: Yes   Plan:  -warfarin 5mg  po x1 tonight -daily INR -will educate prior to discharge -follow for s/s bleeding  Alex Alvarez D. Trishia Cuthrell, PharmD, BCPS Clinical Pharmacist Pager: [phone] 07/07/2015 1:09 PM

## 2015-07-07 NOTE — Significant Event (Signed)
Rapid Response Event Note  Overview: Time Called: 1332 Arrival Time: 1335 Event Type: Cardiac, Hypotension  Initial Focused Assessment:  Called by RN for patient who is bradycardiac30-40's, hypotensive SBP 70's and was unresponsive for about 1 minutes.  Upon my arrival to patients room, RN, PT and family in room.  As per PT, PT working with him to get out of bed, when Patient became unresponsive for about 1 minute and HR noted to be in the 30's.  Patient was reclined in chair on my arrival, alert and communicating, c/o dizziness, denies CP or SOB.   Skin warm and dry.  Initially on my arrival BP 68/47, HR 42, 93% on nasal cannula.   Interventions:  NS 500 cc bolus started BP rechecked 91/53, 37, 92% on nasal cannula.  PT hoyer lifted patient back to bed and placed in trendelenburg position.  Patient states feels slightly better.  EKG done.  BP 85/56, HR 52.  Spoke with Dr. Ola Spurr,-- continue with bolus and monitor.  Lab in to draw blood.  BP 85/61, HR 53, 95% on 4 lpm.  Another 500 CC NS bolus started.  Patient states is feeling better.   BP rechecked 93/61, HR 52  Event Summary:  RN to call if assistance needed   at      at          [facility], Harlin Rain

## 2015-07-07 NOTE — Progress Notes (Signed)
Utilization review completed.  

## 2015-07-08 LAB — BASIC METABOLIC PANEL
ANION GAP: 5 (ref 5–15)
BUN: 11 mg/dL (ref 6–20)
CHLORIDE: 101 mmol/L (ref 101–111)
CO2: 34 mmol/L — AB (ref 22–32)
Calcium: 8.3 mg/dL — ABNORMAL LOW (ref 8.9–10.3)
Creatinine, Ser: 1.21 mg/dL (ref 0.61–1.24)
GFR calc non Af Amer: 59 mL/min — ABNORMAL LOW (ref >60)
GLUCOSE: 120 mg/dL — AB (ref 65–99)
POTASSIUM: 4 mmol/L (ref 3.5–5.1)
Sodium: 140 mmol/L (ref 135–145)

## 2015-07-08 LAB — CBC
HEMATOCRIT: 40.8 % (ref 39.0–52.0)
HEMOGLOBIN: 13.1 g/dL (ref 13.0–17.0)
MCH: 32.1 pg (ref 26.0–34.0)
MCHC: 32.1 g/dL (ref 30.0–36.0)
MCV: 100 fL (ref 78.0–100.0)
Platelets: 133 10*3/uL — ABNORMAL LOW (ref 150–400)
RBC: 4.08 MIL/uL — AB (ref 4.22–5.81)
RDW: 12.4 % (ref 11.5–15.5)
WBC: 7.4 10*3/uL (ref 4.0–10.5)

## 2015-07-08 LAB — PROTIME-INR
INR: 1.28 (ref 0.00–1.49)
Prothrombin Time: 16.1 seconds — ABNORMAL HIGH (ref 11.6–15.2)

## 2015-07-08 MED ORDER — WARFARIN SODIUM 5 MG PO TABS
5.0000 mg | ORAL_TABLET | Freq: Once | ORAL | Status: AC
  Administered 2015-07-08: 5 mg via ORAL
  Filled 2015-07-08: qty 1

## 2015-07-08 NOTE — Progress Notes (Signed)
Physical Therapy Treatment Patient Details Name: Alex Alvarez MRN: [id number] DOB: [birth year] Today's Date: 07/08/2015    History of Present Illness Pt admitted for Left TKA. 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 VSS with session today, however pt did report feeling lightheaded/dizzy after walking 15 feet to door. Pt then sat in chair and oxygen reapplied with pt reporting feeling better.  Pt needing min-mod assist (see OT notes as well from before PT session) for mobility. Needs to be supervision to mod I to discharge home safely as he lives with disabled spouse. Acute PT to continue.   Equipment Recommendations  Rolling walker with 5" wheels    Recommendations for Other Services OT consult     Precautions / Restrictions Precautions Precautions: Fall;Knee Required Braces or Orthoses: Knee Immobilizer - Left Knee Immobilizer - Left: On when out of bed or walking Restrictions Weight Bearing Restrictions: Yes LLE Weight Bearing: Weight bearing as tolerated    Mobility  Bed Mobility Overal bed mobility: Needs Assistance  General bed mobility comments: not performed: pt out of bed in chair on arrival (up with OT prior to PT) and back to chair after session  Transfers Overall transfer level: Needs assistance Equipment used: Rolling walker (2 wheeled) Transfers: Sit to/from Stand Sit to Stand: Min assist Stand pivot transfers: +2 physical assistance;Min assist       General transfer comment: min assist of 1 person to stand from recliner (2cd person in room for safety). cues on technique, hand placement and left leg placement. cues for anterior weight shift to assist with standing.  Ambulation/Gait Ambulation/Gait assistance: Min assist Ambulation Distance (Feet): 15 Feet Assistive device: Rolling walker (2 wheeled) Gait Pattern/deviations: Step-to pattern;Antalgic;Decreased step length -  left;Decreased step length - right;Decreased stance time - left;Trunk flexed Gait velocity: decreased Gait velocity interpretation: Below normal speed for age/gender General Gait Details: multimodal cues provided for posture, walker use/position with gait and to correct above gait deviations.       Cognition Arousal/Alertness: Awake/alert Behavior During Therapy: WFL for tasks assessed/performed Overall Cognitive Status: Within Functional Limits for tasks assessed       Exercises Total Joint Exercises Ankle Circles/Pumps: AROM;Both;10 reps;Seated Quad Sets: AAROM;Strengthening;Left;10 reps;Seated Heel Slides: AAROM;Strengthening;Left;10 reps;Seated Hip ABduction/ADduction: AAROM;Strengthening;Left;10 reps;Seated Straight Leg Raises: AAROM;Strengthening;Left;10 reps;Seated     Pertinent Vitals/Pain Pain Assessment: 0-10 Pain Score: 4  Pain Location: left knee Pain Descriptors / Indicators: Aching;Sore Pain Intervention(s): Limited activity within patient's tolerance;Monitored during session;Premedicated before session;Repositioned        PT Goals (current goals can now be found in the care plan section) Acute Rehab PT Goals Patient Stated Goal: return home to care for wife & yardwork  PT Goal Formulation: With patient/family Time For Goal Achievement: 07/14/15 Potential to Achieve Goals: Good Progress towards PT goals: Progressing toward goals    Frequency  7X/week    PT Plan Current plan remains appropriate    End of Session Equipment Utilized During Treatment: Gait belt Activity Tolerance: Patient tolerated treatment well Patient left: in chair;with call bell/phone within reach;with nursing/sitter in room     Time: 1050-1114 PT Time Calculation (min) (ACUTE ONLY): 24 min  Charges:  $Gait Training: 8-22 mins $Therapeutic Exercise: 8-22 mins                    Willow Ora 07/08/2015, 3:54 PM   Willow Ora, PTA, CLT Acute Rehab Services Office-  [phone] 07/08/15, 4:03 PM

## 2015-07-08 NOTE — Progress Notes (Signed)
Occupational Therapy Evaluation Patient Details Name: Alex Alvarez MRN: [id number] DOB: June 20, 1946 Today's Date: 07/08/2015    History of Present Illness Pt admitted for Left TKA. 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  He was happy to participate in therapy.  Requires increased level of assist due to pain and limited use of left LE during functional mobility.  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  Patient does not have 24/7 assistance at home.  He will need to be at least supervision level if returning home safely.  May need to updated plan to SNF if he does not progress to supervision/mod I level. Will benefit from OT services to improve independence with ADLs.    Follow Up Recommendations  Home health OT;Supervision/Assistance - 24 hour    Equipment Recommendations  None recommended by OT (Patient has 3n1 at home)    Recommendations for Other Services       Precautions / Restrictions Precautions Precautions: Fall;Knee Required Braces or Orthoses: Knee Immobilizer - Left Knee Immobilizer - Left: On when out of bed or walking Restrictions Weight Bearing Restrictions: Yes LLE Weight Bearing: Weight bearing as tolerated      Mobility Bed Mobility Overal bed mobility: Needs Assistance Bed Mobility: Supine to Sit     Supine to sit: Min guard     General bed mobility comments: Min guard for safety. Use of bed rail.  Transfers Overall transfer level: Needs assistance Equipment used: Rolling walker (2 wheeled) Transfers: Sit to/from [facility] Sit to Stand: +2 physical assistance;Min assist Stand pivot transfers: +2 physical assistance;Min assist       General transfer comment: Patient required +2 assist to power up from bed.  Also +2 for safety after syncopal episode yesterday.  Verbal cues for sequencing transfer and for safe hand placement.    Balance                                            ADL Overall ADL's : Needs assistance/impaired             Lower Body Bathing: Maximal assistance;Bed level       Lower Body Dressing: Maximal assistance;Bed level   Toilet Transfer: +2 for physical assistance;Minimal assistance   Toileting- Clothing Manipulation and Hygiene: Moderate assistance       Functional mobility during ADLs: +2 for physical assistance;Minimal assistance General ADL Comments: Patient requiring +2 assist to power up from bed into standing.  Stood for ~2 minutes to use urinal (assist from nursing student to use urinal).  Patient then performed stand pivot transfer to recliner.  C/o some dizziness upon sitting EOB but reported that it cleared up after ~1 minute (BP in supine was 154/98 and in sitting was 158/90).       Vision     Perception     Praxis      Pertinent Vitals/Pain Pain Assessment: 0-10 Pain Score: 5  Pain Location: left knee Pain Descriptors / Indicators: Aching;Cramping Pain Intervention(s): Monitored during session     Hand Dominance     Extremity/Trunk Assessment Upper Extremity Assessment Upper Extremity Assessment: Overall WFL for tasks assessed   Lower Extremity Assessment Lower Extremity Assessment: Defer to PT evaluation       Communication Communication Communication: No difficulties   Cognition Arousal/Alertness: Awake/alert Behavior During Therapy:  WFL for tasks assessed/performed Overall Cognitive Status: Within Functional Limits for tasks assessed                     General Comments       Exercises       Shoulder Instructions      Home Living Family/patient expects to be discharged to:: Private residence Living Arrangements: Spouse/significant other;Children Available Help at Discharge: Family;Friend(s);Available PRN/intermittently Type of Home: House Home Access: Ramped entrance     Home Layout: One level     Bathroom Shower/Tub: Radiographer, therapeutic: Standard     Home Equipment: Shower seat;Bedside commode   Additional Comments: Wife is in a WC and pt normally helps her with mobility and ADLs. Son may be able to assist patient during the day during first week home.  Patient has arranged for assistance for his wife but does not have regular assist for himself.      Prior Functioning/Environment Level of Independence: Independent             OT Diagnosis: Generalized weakness;Acute pain   OT Problem List: Decreased strength;Decreased activity tolerance;Impaired balance (sitting and/or standing);Decreased knowledge of use of DME or AE;Pain   OT Treatment/Interventions: Self-care/ADL training;DME and/or AE instruction;Therapeutic activities;Patient/family education    OT Goals(Current goals can be found in the care plan section) Acute Rehab OT Goals Patient Stated Goal: return home to care for wife & yardwork  OT Goal Formulation: With patient Time For Goal Achievement: 07/15/15 Potential to Achieve Goals: Good  OT Frequency: Min 2X/week   Barriers to D/C: Decreased caregiver support  Patient has intermittent assist.        Co-evaluation              End of Session Equipment Utilized During Treatment: Gait belt;Rolling walker CPM Left Knee CPM Left Knee: Off Nurse Communication: Mobility status  Activity Tolerance: Patient tolerated treatment well Patient left: in chair;with call bell/phone within reach;with nursing/sitter in room   Time: 1005-1040 OT Time Calculation (min): 35 min Charges:  OT General Charges $OT Visit: 1 Procedure OT Evaluation $Initial OT Evaluation Tier I: 1 Procedure OT Treatments $Self Care/Home Management : 8-22 mins G-Codes:    Darrol Jump OTR/L 07/08/2015, 12:26 PM

## 2015-07-08 NOTE — Op Note (Signed)
NAMEJOEPH, SZATKOWSKI NO.:  [id number]  MEDICAL RECORD NO.:  [id number]  LOCATION:  5N03C                        FACILITY:  [facility]  PHYSICIAN:  Doran Heater. Veverly Fells, M.D. DATE OF BIRTH:  1946/08/09  DATE OF PROCEDURE:  07/07/2015 DATE OF DISCHARGE:                              OPERATIVE REPORT   PREOPERATIVE DIAGNOSIS:  Left knee end-stage osteoarthritis.  POSTOPERATIVE DIAGNOSIS:  Left knee end-stage osteoarthritis.  PROCEDURE PERFORMED:  Left total knee replacement using DePuy Sigma rotating platform prosthesis.  ATTENDING SURGEON:  Doran Heater. Veverly Fells, MD  ASSISTANT:  Abbott Pao. Dixon, PA-C, who has scrubbed the entire procedure and necessary for satisfactory completion of surgery.  ANESTHESIA:  Spinal anesthesia was used plus femoral block.  ESTIMATED BLOOD LOSS:  Minimal.  FLUID REPLACEMENT:  1500 mL crystalloid.  INSTRUMENT COUNTS:  Correct.  COMPLICATIONS:  There were no complications.  ANTIBIOTICS:  Perioperative antibiotics were given.  INDICATIONS:  Patient is a 69 year old male with a history of worsening left knee pain secondary to end-stage osteoarthritis.  The patient has failed conservative management, desires operative treatment to restore function and eliminate pain.  The patient requires use of assistive devices for ambulation.  Has pain at rest and at night and cannot walk 1 block without having to sit down due to pain.  Risks and benefits of surgery was discussed.  Informed consent obtained.  DESCRIPTION OF PROCEDURE:  After an adequate level of anesthesia was achieved, the patient was positioned supine on the operating room table. Left leg correctly identified and sterilely prepped and draped in the usual manner.  A nonsterile tourniquet was then utilized in the proximal thigh.  Time-out called.  We then exsanguinated the limb using Esmarch bandage, elevated the tourniquet to 350 mmHg.  Longitudinal midline incision was created with  the knee in flexion.  Dissection down through subcutaneous tissues.  Medial parapatellar arthrotomy created with a fresh 10 blade scalpel.  The patella everted, lateral patellofemoral ligaments divided.  The distal femur entered using a step-cut drill.  We then placed our distal femoral resection guide intramedullary and resected 10 mm off 5 degrees left.  We then sized the femur to a size 5 anterior down and placed our 4 in 1 cutting block for the anterior- posterior chamfer cuts which were made with an oscillating saw.  We then went ahead and subluxed the tibia anteriorly.  We removed ACL, PCL, and remaining meniscal tissue.  We then performed our tibial cut using external alignment guide 90 degrees perpendicular to the long axis of the tibia with minimal posterior slope with the posterior cruciate substituting prosthesis.  Once we made that cut, we checked our gaps, which were symmetric at 10 mm.  We then resected extra posterior bone off the posterior femoral condyles.  We then went ahead and completed our tibial preparation with the modular drill and keel punch.  We then resected the bone, the box for the femoral component using the box cut guide.  Using the oscillating saw, I removed that bone and then impacted the size 5 left femur in place.  We then reduced the knee with a 12.5 and we were able to get that to  reduce, had full extension and nice stability in flexion.  We then resurfaced the patella going for 25 mm thickness down to 17 and then drilled for the 38 patellar button.  We placed the patellar button in place, ranged the knee, had nice stability.  No-touch technique with normal tracking.  We then removed all trial components, pulse irrigated the knee, dried the bone.  We drilled the hard medial side of the tibia and also little bit of the patella just to get better cement interdigitation.  We did our final drying and then after vacuum mixing, we cemented the components  into place with high viscosity cement from DePuy, the HV cement.  Once the components were in place, we placed the knee in extension with a 12.5 poly and used a patellar clamp to hold patellar compression until the cement was allowed to harden.  Once the cement hardened, we used a quarter-inch curved osteotome and we removed excess cement and then I checked the posterior aspect of the knee, no cement noted.  We then checked again our patellar tracking which was normal and our stability both in flexion and extension, which we felt was adequate with the ability to come to full extension.  We removed the 12.5 trial, inserted the real 12.5 poly, and reduced the knee with a nice snap medially and then brought the knee in extension.  We irrigated thoroughly.  I then closed the median parapatellar arthrotomy with interrupted #1 Vicryl suture followed by 2-0 Vicryl subcutaneous closure, 4-0 Monocryl for skin.  Steri-Strips applied followed by sterile dressing.  The patient tolerated the surgery well.     Doran Heater. Veverly Fells, M.D.     SRN/MEDQ  D:  07/07/2015  T:  07/08/2015  Job:  [id number]

## 2015-07-08 NOTE — Progress Notes (Signed)
ANTICOAGULATION CONSULT NOTE - Initial Consult  Pharmacy Consult for warfarin Indication: VTE prophylaxis  Allergies  Allergen Reactions  . Lipitor [Atorvastatin] Other (See Comments)    Leg cramps  . Lamisil [Terbinafine Hcl] Rash    Patient Measurements: Weight: 237 lb (107.502 kg)   Vital Signs: Temp: 98.6 F (37 C) (11/05 0523) Temp Source: Oral (11/05 0523) BP: 139/74 mmHg (11/05 0523) Pulse Rate: 90 (11/05 0523)  Labs:  Recent Labs  07/07/15 1418 07/08/15 0508  HGB 13.6 13.1  HCT 41.3 40.8  PLT 129* 133*  LABPROT  --  16.1*  INR  --  1.28  CREATININE 1.40* 1.21    Estimated Creatinine Clearance: 70.9 mL/min (by C-G formula based on Cr of 1.21).   Assessment: 15 YOM s/p left TKA on 11/4, on coumadin and lovenox 30 mg sq Q 12 hrs for VTE px. INR 1.28, but he didn't receive coumadin yesterday. Hgb 13.1, pltc 133K.   Goal of Therapy:  INR 2-3 Monitor platelets by anticoagulation protocol: Yes   Plan:  -warfarin 5mg  po x1 tonight -daily INR -will educate prior to discharge -follow for s/s bleeding  Maryanna Shape, PharmD, BCPS  Clinical Pharmacist  Pager: [phone]  07/08/2015 8:30 AM

## 2015-07-08 NOTE — Progress Notes (Signed)
Alex Alvarez  MRN: [id number] DOB/Age: 70-01-1946 70 y.o. Physician: Ander Slade, M.D. 1 Day Post-Op Procedure(s) (LRB): LEFT TOTAL KNEE ARTHROPLASTY (Left)  Subjective: Reports mild left knee pain, appetite good, no N/V Vital Signs Temp:  [98.6 F (37 C)-99.4 F (37.4 C)] 99.1 F (37.3 C) (11/05 1000) Pulse Rate:  [37-98] 98 (11/05 1043) Resp:  [15-18] 18 (11/05 1000) BP: (71-158)/(52-98) 158/90 mmHg (11/05 1043) SpO2:  [93 %-99 %] 99 % (11/05 1044)  Lab Results  Recent Labs  07/07/15 1418 07/08/15 0508  WBC 10.7* 7.4  HGB 13.6 13.1  HCT 41.3 40.8  PLT 129* 133*   BMET  Recent Labs  07/07/15 1418 07/08/15 0508  NA  --  140  K  --  4.0  CL  --  101  CO2  --  34*  GLUCOSE  --  120*  BUN  --  11  CREATININE 1.40* 1.21  CALCIUM  --  8.3*   INR  Date Value Ref Range Status  07/08/2015 1.28 0.00 - 1.49 Final     Exam  Resting comfortably in bed, moves ankle with fair strength, N/V intact, dressings dry  Plan Mobilize with PT per TKA protocol  Alex Alvarez M Alex Alvarez 07/08/2015, 1:06 PM    Contact # [phone]

## 2015-07-08 NOTE — Progress Notes (Signed)
Physical Therapy Treatment Patient Details Name: Alex Alvarez MRN: [id number] DOB: [birth year] Today's Date: 07/08/2015    History of Present Illness Pt admitted for Left TKA. 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 Acute PT to continue.  Follow Up Recommendations  Home health PT     Equipment Recommendations  Rolling walker with 5" wheels    Recommendations for Other Services OT consult     Precautions / Restrictions Precautions Precautions: Fall;Knee Required Braces or Orthoses: Knee Immobilizer - Left Knee Immobilizer - Left: On when out of bed or walking Restrictions LLE Weight Bearing: Weight bearing as tolerated    Mobility  Bed Mobility Overal bed mobility: Needs Assistance Bed Mobility: Supine to Sit     Supine to sit: Min assist     General bed mobility comments: with bed flat, no rail or trapeze used: min assist with cues on sequencing and technique. increased time needed.  Transfers Overall transfer level: Needs assistance Equipment used: Rolling walker (2 wheeled) Transfers: Sit to/from Stand Sit to Stand: Min assist;Mod assist Stand pivot transfers: Min assist       General transfer comment: from bed min assist with cues on hand placment and anterior weight shifting to stand. Mod assist to sit in recliner with uncrontrolled descent. cues to reach back and for left leg position with sitting down. min assist with walker  for stand<>pivot transfer bed>chair.                                   Cognition Arousal/Alertness: Awake/alert Behavior During Therapy: WFL for tasks assessed/performed Overall Cognitive Status: Within Functional Limits for tasks assessed        Exercises Total Joint Exercises Ankle Circles/Pumps: AROM;Both;10 reps;Supine Quad Sets: AAROM;Strengthening;Left;10 reps;Supine Heel Slides: AAROM;Strengthening;Left;10 reps;Supine Hip ABduction/ADduction:  AAROM;Strengthening;Left;10 reps;Supine Straight Leg Raises: AAROM;Strengthening;Left;10 reps;Supine     Pertinent Vitals/Pain Pain Assessment: 0-10 Pain Score: 5  Pain Location: left knee Pain Descriptors / Indicators: Aching;Sore Pain Intervention(s): Limited activity within patient's tolerance;Premedicated before session;Monitored during session;Repositioned     PT Goals (current goals can now be found in the care plan section) Acute Rehab PT Goals Patient Stated Goal: return home to care for wife & yardwork  PT Goal Formulation: With patient/family Time For Goal Achievement: 07/14/15 Potential to Achieve Goals: Good Progress towards PT goals: Progressing toward goals    Frequency  7X/week    PT Plan Current plan remains appropriate    End of Session Equipment Utilized During Treatment: Gait belt;Left knee immobilizer Activity Tolerance: Patient tolerated treatment well Patient left: in chair;with call bell/phone within reach;with nursing/sitter in room     Time: 1455-1511 PT Time Calculation (min) (ACUTE ONLY): 16 min  Charges:  $Gait Training: 8-22 mins $Therapeutic Exercise: 8-22 mins           Willow Ora 07/08/2015, 4:10 PM   Willow Ora, PTA, CLT Acute Rehab Services Office- [phone]  4:11 PM

## 2015-07-09 LAB — CBC
HEMATOCRIT: 37 % — AB (ref 39.0–52.0)
Hemoglobin: 12 g/dL — ABNORMAL LOW (ref 13.0–17.0)
MCH: 32.3 pg (ref 26.0–34.0)
MCHC: 32.4 g/dL (ref 30.0–36.0)
MCV: 99.5 fL (ref 78.0–100.0)
Platelets: 117 10*3/uL — ABNORMAL LOW (ref 150–400)
RBC: 3.72 MIL/uL — ABNORMAL LOW (ref 4.22–5.81)
RDW: 12.2 % (ref 11.5–15.5)
WBC: 8.4 10*3/uL (ref 4.0–10.5)

## 2015-07-09 LAB — PROTIME-INR
INR: 1.39 (ref 0.00–1.49)
PROTHROMBIN TIME: 17.1 s — AB (ref 11.6–15.2)

## 2015-07-09 MED ORDER — WARFARIN SODIUM 5 MG PO TABS
5.0000 mg | ORAL_TABLET | Freq: Once | ORAL | Status: AC
  Administered 2015-07-09: 5 mg via ORAL
  Filled 2015-07-09: qty 1

## 2015-07-09 NOTE — Progress Notes (Signed)
Physical Therapy Treatment Patient Details Name: Alex Alvarez MRN: [id number] DOB: Jan 19, 1946 Today's Date: 07/09/2015    History of Present Illness Pt admitted for Left TKA. 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  He requires min guard assist during sit<>stand transfers, practiced controlled descent to chair and bed this session. Pt remains appropriate for home at time of d/c as he is progressing well.  Pt will benefit from continued skilled PT services to increase functional independence and safety.   Follow Up Recommendations  Home health PT     Equipment Recommendations  Rolling walker with 5" wheels    Recommendations for Other Services       Precautions / Restrictions Precautions Precautions: Fall;Knee Precaution Booklet Issued: No Precaution Comments: educated on knee precautions Required Braces or Orthoses: Knee Immobilizer - Left Knee Immobilizer - Left: On when out of bed or walking Restrictions Weight Bearing Restrictions: Yes LLE Weight Bearing: Weight bearing as tolerated    Mobility  Bed Mobility Overal bed mobility: Modified Independent Bed Mobility: Sit to Supine       Sit to supine: Modified independent (Device/Increase time)   General bed mobility comments: Slightly increased time and min use of bed rails but no physical assist or cues needed.  Transfers Overall transfer level: Needs assistance Equipment used: Rolling walker (2 wheeled) Transfers: Sit to/from Stand Sit to Stand: Min guard         General transfer comment: Min guard as pt performs sit<>stand quickly.  Initially uncontrolled descent to recliner chair during stand>sit but improved following verbal cues, practiced x3.  Cues to slide Lt LE out in front to allow for slow controlled descent.  Ambulation/Gait Ambulation/Gait assistance:  Supervision Ambulation Distance (Feet): 100 Feet Assistive device: Rolling walker (2 wheeled) Gait Pattern/deviations: Step-to pattern;Antalgic;Decreased weight shift to left;Decreased stride length;Decreased stance time - left Gait velocity: decreased Gait velocity interpretation: Below normal speed for age/gender General Gait Details: Cues to increase WB on Lt LE at tolerated as pt WB heavily through Bil UEs.  Cues to stand upright and look ahead.  Height of RW adjusted down to fit pt more appropriately.  Pt reports fatigue after ambualting 80 ft but is motivated and ambulates 100 ft in total.   Stairs            Wheelchair Mobility    Modified Rankin (Stroke Patients Only)       Balance Overall balance assessment: Needs assistance Sitting-balance support: Feet supported Sitting balance-Leahy Scale: Good     Standing balance support: Bilateral upper extremity supported;During functional activity Standing balance-Leahy Scale: Poor Standing balance comment: Relies on RW for support                    Cognition Arousal/Alertness: Awake/alert Behavior During Therapy: WFL for tasks assessed/performed Overall Cognitive Status: Within Functional Limits for tasks assessed                      Exercises Total Joint Exercises Ankle Circles/Pumps: AROM;Both;10 reps;Seated Quad Sets: AAROM;Strengthening;Left;10 reps;Seated Hip ABduction/ADduction: AAROM;Left;10 reps;Seated Straight Leg Raises: AAROM;Strengthening;Left;10 reps;Seated    General Comments General comments (skin integrity, edema, etc.): HR 115bpm and SpO2 94% w/ therapeutic exercises at start of session.  Pt not symptomatic today.      Pertinent Vitals/Pain Pain Assessment: Faces Pain Score: 2  Faces Pain  Scale: Hurts a little bit Pain Location: Lt knee Pain Descriptors / Indicators: Sore Pain Intervention(s): Limited activity within patient's tolerance;Monitored during session;Repositioned     Home Living                      Prior Function            PT Goals (current goals can now be found in the care plan section) Acute Rehab PT Goals Patient Stated Goal: to return to fishing and yardwork PT Goal Formulation: With patient Time For Goal Achievement: 07/14/15 Potential to Achieve Goals: Good Progress towards PT goals: Progressing toward goals    Frequency  7X/week    PT Plan Current plan remains appropriate    Co-evaluation             End of Session Equipment Utilized During Treatment: Gait belt;Left knee immobilizer Activity Tolerance: Patient tolerated treatment well;Patient limited by fatigue Patient left: in bed;with call bell/phone within reach     Time: 9935-7017 PT Time Calculation (min) (ACUTE ONLY): 35 min  Charges:  $Gait Training: 8-22 mins $Therapeutic Exercise: 8-22 mins                    G Codes:      Joslyn Hy PT, DPT [phone] Pager: [phone] 07/09/2015, 11:31 AM

## 2015-07-09 NOTE — Care Management Note (Addendum)
Case Management Note  Patient Details  Name: Alex Alvarez MRN: [id number] Date of Birth: 1946/08/11  Subjective/Objective:                  LEFT TOTAL KNEE ARTHROPLASTY (Left)  Action/Plan: CM spoke to patient at the bedside who states that he is planning to go home tomorrow with [facility] services. CM Spoke to patient about home situation and he said that his wife is a paraplegic and has caregivers. CM explained the recommendation for 24 hour caregivers and he said that his daughter would be there on the weekends and that his son is off work this week and can be there this week. Pt also states that his wife has a caregiver at night who will be there to support him also as needed. CM explained that if he felt he needed additional support we could see if he would qualify for SNF and he said that he wants to go home and felt that he would have enough support. CM spoke to patient about need for [facility] services.   CM said that pt previously set up with [facility] from the office but CM offered choice and he said that his wife uses [facility] and he would like to do the same. Patient agreeable to [facility] PT/OT and could benefit from [facility] RN and Aide. CM spoke to Wyatt Portela, [facility] who felt that was appropriate but would leave the final call up to Dr. Veverly Fells. CM called Amy with [facility] to tell her about the change with [facility] service.   CM called Tiffany with [facility] to advise of need for [facility] PT/OT/Aide, RN but awaiting orders and face to face. Referral accepted. Patient already has rolling walker and 3N1 delivered at the bedside and declines the need for additional DME.   CM will continue to follow patient needs to ensure safe discharge plan.   Expected Discharge Date:  07/10/15              Expected Discharge Plan:  [place]  In-House Referral:     Discharge planning Services  CM Consult  Post Acute Care Choice:  Home Health Choice offered to:  Patient  DME Arranged:    DME Agency:     HH Arranged:  RN,  PT, OT, Nurse's Aide [place] Agency:  [place]  Status of Service:  In process, will continue to follow  Medicare Important Message Given:    Date Medicare IM Given:    Medicare IM give by:    Date Additional Medicare IM Given:    Additional Medicare Important Message give by:     If discussed at [place] of Stay Meetings, dates discussed:    Additional Comments:  Guido Sander, RN 07/09/2015, 10:46 AM

## 2015-07-09 NOTE — Progress Notes (Addendum)
Occupational Therapy Treatment Patient Details Name: Alex Alvarez MRN: [id number] DOB: 1946-05-14 Today's Date: 07/09/2015    History of present illness Pt admitted for Left TKA. 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 Education provided in session. Continue to recommend [place] upon d/c.  Follow Up Recommendations  Home health OT;Supervision/Assistance - 24 hour    Equipment Recommendations  None recommended by OT    Recommendations for Other Services      Precautions / Restrictions Precautions Precautions: Fall;Knee Precaution Booklet Issued: No Precaution Comments: educated on knee precautions Required Braces or Orthoses: Knee Immobilizer - Left Knee Immobilizer - Left: On when out of bed or walking Restrictions Weight Bearing Restrictions: Yes LLE Weight Bearing: Weight bearing as tolerated       Mobility Bed Mobility               General bed mobility comments: not assessed  Transfers Overall transfer level: Needs assistance Equipment used: Rolling walker (2 wheeled) Transfers: Sit to/from Stand Sit to Stand: Min guard         General transfer comment: instructed pt not to stand without RW in front of him. OT assisted in positioning walker.     Balance    Able to stand and pull up underwear with RW in front and Min guard for safety.                                ADL Overall ADL's : Needs assistance/impaired                     Lower Body Dressing: Minimal assistance;With adaptive equipment;Sitting/lateral leans   Toilet Transfer: Min guard;Ambulation;RW;BSC             General ADL Comments: Educated on LB dressing technique. Educated on AE. Explained benefit of reaching down to left foot to try to manage sock as it allows knee to bend.       Vision                     Perception     Praxis      Cognition  Awake/Alert Behavior During Therapy: WFL for tasks  assessed/performed Overall Cognitive Status: Decreased safety awareness                       Extremity/Trunk Assessment               Exercises     Shoulder Instructions       General Comments      Pertinent Vitals/ Pain       Pain Assessment: 0-10 Pain Score: 2  Pain Location: left knee Pain Intervention(s): Monitored during session  Home Living                                          Prior Functioning/Environment              Frequency Min 2X/week     Progress Toward Goals  OT Goals(current goals can now be found in the care plan section)  Progress towards OT goals: Progressing toward goals  Acute Rehab OT Goals Patient Stated Goal: not stated OT Goal Formulation: With patient Time For Goal Achievement: 07/15/15 Potential to Achieve Goals: Good  ADL Goals Pt Will Transfer to Toilet: ambulating;bedside commode;with modified independence Pt Will Perform Toileting - Clothing Manipulation and hygiene: sit to/from stand;with modified independence Pt Will Perform Tub/Shower Transfer: Shower transfer;with modified independence;shower seat;rolling walker Additional ADL Goal #1: Patient will be able to complete bed mobility (HOB flat) at mod I level as precursor for EOB ADLs. Additional ADL Goal #2: Pt will be able to complete LB dressing/bathing tasks at Mod I level, using AE as needed.  Plan Discharge plan remains appropriate    Co-evaluation                 End of Session Equipment Utilized During Treatment: Gait belt;Rolling walker;Left knee immobilizer   Activity Tolerance Patient tolerated treatment well; a little lightheaded   Patient Left in chair;with call bell/phone within reach   Nurse Communication          Time: 4360-6770 OT Time Calculation (min): 14 min  Charges: OT General Charges $OT Visit: 1 Procedure OT Treatments $Self Care/Home Management : 8-22 mins  Benito Mccreedy  OTR/L [phone] 07/09/2015, 9:09 AM

## 2015-07-09 NOTE — Progress Notes (Signed)
Subjective: 2 Days Post-Op Procedure(s) (LRB): LEFT TOTAL KNEE ARTHROPLASTY (Left) Patient reports pain as mild to the left knee.  Progressing with PT. Tolerating PO's. Denies SOB, CP, or calf pain.  Objective: Vital signs in last 24 hours: Temp:  [98 F (36.7 C)-98.7 F (37.1 C)] 98 F (36.7 C) (11/06 0543) Pulse Rate:  [79-98] 87 (11/06 0543) Resp:  [16-17] 16 (11/06 0543) BP: (128-158)/(79-95) 128/79 mmHg (11/06 0543) SpO2:  [92 %-99 %] 92 % (11/06 0543)  Intake/Output from previous day: 11/05 0701 - 11/06 0700 In: 720 [P.O.:720] Out: 2000 [Urine:2000] Intake/Output this shift:     Recent Labs  07/07/15 1418 07/08/15 0508 07/09/15 0222  HGB 13.6 13.1 12.0*    Recent Labs  07/08/15 0508 07/09/15 0222  WBC 7.4 8.4  RBC 4.08* 3.72*  HCT 40.8 37.0*  PLT 133* 117*    Recent Labs  07/07/15 1418 07/08/15 0508  NA  --  140  K  --  4.0  CL  --  101  CO2  --  34*  BUN  --  11  CREATININE 1.40* 1.21  GLUCOSE  --  120*  CALCIUM  --  8.3*    Recent Labs  07/08/15 0508 07/09/15 0222  INR 1.28 1.39    Alert and oriented x3. RRR, Lungs clear, BS x4. Left Calf soft and non tender. L knee dressing changed. No DVT signs. No signs of infection or compartment syndrome. LLE grossly neurovascularly intact.   Assessment/Plan: 2 Days Post-Op Procedure(s) (LRB): LEFT TOTAL KNEE ARTHROPLASTY (Left) Dressing changed Plan D/c tomorrow Up with PT Continue current care On Lovenox  Sunnie Odden L 07/09/2015, 10:19 AM

## 2015-07-09 NOTE — Progress Notes (Signed)
ANTICOAGULATION CONSULT NOTE - Follow Up Consult  Pharmacy Consult for Warfarin Indication: VTE prophylaxis  Allergies  Allergen Reactions  . Lipitor [Atorvastatin] Other (See Comments)    Leg cramps  . Lamisil [Terbinafine Hcl] Rash    Patient Measurements: Weight: 237 lb (107.502 kg)  Vital Signs: Temp: 98 F (36.7 C) (11/06 0543) Temp Source: Oral (11/06 0543) BP: 128/79 mmHg (11/06 0543) Pulse Rate: 87 (11/06 0543)  Labs:  Recent Labs  07/07/15 1418 07/08/15 0508 07/09/15 0222  HGB 13.6 13.1 12.0*  HCT 41.3 40.8 37.0*  PLT 129* 133* 117*  LABPROT  --  16.1* 17.1*  INR  --  1.28 1.39  CREATININE 1.40* 1.21  --     Estimated Creatinine Clearance: 70.9 mL/min (by C-G formula based on Cr of 1.21).   Medications:  Scheduled:  . aspirin EC  81 mg Oral Once  . atorvastatin  10 mg Oral q1800  . coumadin book   Does not apply Once  . diclofenac  75 mg Oral Daily  . diltiazem  240 mg Oral Daily  . docusate sodium  100 mg Oral BID  . enoxaparin (LOVENOX) injection  30 mg Subcutaneous Q12H  . ferrous sulfate  325 mg Oral TID PC  . irbesartan  300 mg Oral Daily   And  . hydrochlorothiazide  12.5 mg Oral Daily  . multivitamin with minerals  1 tablet Oral Daily  . Warfarin - Pharmacist Dosing Inpatient   Does not apply q1800   Infusions:  . sodium chloride     PRN: acetaminophen **OR** acetaminophen, bisacodyl, menthol-cetylpyridinium **OR** phenol, methocarbamol **OR** methocarbamol (ROBAXIN)  IV, metoCLOPramide **OR** metoCLOPramide (REGLAN) injection, morphine injection, ondansetron **OR** ondansetron (ZOFRAN) IV, oxyCODONE, polyethylene glycol, zolpidem  Assessment: 12 YOM s/p left TKA on 11/4, on coumadin and lovenox 30 mg sq Q 12 hrs for VTE px.  INR 1.28>1.39 after Warfarin 5 mg x 1.  Hgb 12.0, Plts 117. No bleeding noted  Goal of Therapy:  INR 2-3 Monitor platelets by anticoagulation protocol: Yes   Plan:  Warfarin 5 mg x 1 Daily INR Monitor for  s/sx of bleeding  Bennye Alm, PharmD Pharmacy Resident [phone]  07/09/2015,10:38 AM

## 2015-07-10 ENCOUNTER — Encounter (HOSPITAL_COMMUNITY): Payer: Self-pay | Admitting: Orthopedic Surgery

## 2015-07-10 LAB — PROTIME-INR
INR: 1.7 — ABNORMAL HIGH (ref 0.00–1.49)
PROTHROMBIN TIME: 19.9 s — AB (ref 11.6–15.2)

## 2015-07-10 LAB — CBC
HCT: 36.2 % — ABNORMAL LOW (ref 39.0–52.0)
Hemoglobin: 11.9 g/dL — ABNORMAL LOW (ref 13.0–17.0)
MCH: 32 pg (ref 26.0–34.0)
MCHC: 32.9 g/dL (ref 30.0–36.0)
MCV: 97.3 fL (ref 78.0–100.0)
PLATELETS: 122 10*3/uL — AB (ref 150–400)
RBC: 3.72 MIL/uL — ABNORMAL LOW (ref 4.22–5.81)
RDW: 12 % (ref 11.5–15.5)
WBC: 8.4 10*3/uL (ref 4.0–10.5)

## 2015-07-10 NOTE — Progress Notes (Signed)
Physical Therapy Treatment Patient Details Name: RENSO SWETT MRN: [id number] DOB: 1945/11/09 Today's Date: 2015-07-20    History of Present Illness Pt admitted for Left TKA. 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 Pt with good strength LLE, gait and posture. Greatest deficit at this point is controlling descent to surface. Will continue to follow but safe for return home.   Follow Up Recommendations  Home health PT     Equipment Recommendations       Recommendations for Other Services       Precautions / Restrictions Precautions Precautions: Fall;Knee Precaution Comments: educated on knee precautions Restrictions LLE Weight Bearing: Weight bearing as tolerated    Mobility  Bed Mobility Overal bed mobility: Modified Independent                Transfers Overall transfer level: Needs assistance   Transfers: Sit to/from Stand Sit to Stand: Min guard         General transfer comment: cues for hand placement and controlled descent with 2 trials to slow and control descent  Ambulation/Gait Ambulation/Gait assistance: Supervision Ambulation Distance (Feet): 250 Feet Assistive device: Rolling walker (2 wheeled) Gait Pattern/deviations: Step-through pattern;Decreased stride length   Gait velocity interpretation: Below normal speed for age/gender General Gait Details: cues for pushing rather than lifting RW, no buckling and cues to look up    Stairs            Wheelchair Mobility    Modified Rankin (Stroke Patients Only)       Balance                                    Cognition Arousal/Alertness: Awake/alert Behavior During Therapy: WFL for tasks assessed/performed Overall Cognitive Status: Within Functional Limits for tasks assessed                      Exercises Total Joint Exercises Quad Sets: AROM;Left;15  reps;Supine Heel Slides: AROM;Left;15 reps;Supine Straight Leg Raises: AROM;Left;15 reps;Supine Long Arc Quad: AROM;Seated;Left;15 reps Goniometric ROM: 3-78    General Comments        Pertinent Vitals/Pain Pain Assessment: No/denies pain    Home Living                      Prior Function            PT Goals (current goals can now be found in the care plan section) Progress towards PT goals: Progressing toward goals    Frequency       PT Plan Current plan remains appropriate    Co-evaluation             End of Session   Activity Tolerance: Patient tolerated treatment well Patient left: in chair;with call bell/phone within reach     Time: 0808-0827 PT Time Calculation (min) (ACUTE ONLY): 19 min  Charges:  $Gait Training: 8-22 mins                    G Codes:      Melford Aase 2015/07/20, 8:32 AM Elwyn Reach, [place]

## 2015-07-10 NOTE — Discharge Summary (Signed)
Physician Discharge Summary   Patient ID: Alex Alvarez MRN: [id number] DOB/AGE: 05-17-1946 69 y.o.  Admit date: 07/07/2015 Discharge date: 07/10/2015  Admission Diagnoses:  Active Problems:   H/O total knee replacement   Discharge Diagnoses:  Same   Surgeries: Procedure(s): LEFT TOTAL KNEE ARTHROPLASTY on 07/07/2015   Consultants: PT/OT  Discharged Condition: Stable  Hospital Course: Alex Alvarez is an 69 y.o. male who was admitted 07/07/2015 with a chief complaint of No chief complaint on file. , and found to have a diagnosis of <principal problem not specified>.  They were brought to the operating room on 07/07/2015 and underwent the above named procedures.    The patient had an uncomplicated hospital course and was stable for discharge.  Recent vital signs:  Filed Vitals:   07/10/15 0533  BP: 104/58  Pulse: 57  Temp: 97.9 F (36.6 C)  Resp: 15    Recent laboratory studies:  Results for orders placed or performed during the hospital encounter of 07/07/15  CBC  Result Value Ref Range   WBC 10.7 (H) 4.0 - 10.5 K/uL   RBC 4.20 (L) 4.22 - 5.81 MIL/uL   Hemoglobin 13.6 13.0 - 17.0 g/dL   HCT 41.3 39.0 - 52.0 %   MCV 98.3 78.0 - 100.0 fL   MCH 32.4 26.0 - 34.0 pg   MCHC 32.9 30.0 - 36.0 g/dL   RDW 12.2 11.5 - 15.5 %   Platelets 129 (L) 150 - 400 K/uL  Creatinine, serum  Result Value Ref Range   Creatinine, Ser 1.40 (H) 0.61 - 1.24 mg/dL   GFR calc non Af Amer 49 (L) >60 mL/min   GFR calc Af Amer 57 (L) >60 mL/min  CBC  Result Value Ref Range   WBC 7.4 4.0 - 10.5 K/uL   RBC 4.08 (L) 4.22 - 5.81 MIL/uL   Hemoglobin 13.1 13.0 - 17.0 g/dL   HCT 40.8 39.0 - 52.0 %   MCV 100.0 78.0 - 100.0 fL   MCH 32.1 26.0 - 34.0 pg   MCHC 32.1 30.0 - 36.0 g/dL   RDW 12.4 11.5 - 15.5 %   Platelets 133 (L) 150 - 400 K/uL  Basic metabolic panel  Result Value Ref Range   Sodium 140 135 - 145 mmol/L   Potassium 4.0 3.5 - 5.1 mmol/L   Chloride 101 101 - 111 mmol/L   CO2 34  (H) 22 - 32 mmol/L   Glucose, Bld 120 (H) 65 - 99 mg/dL   BUN 11 6 - 20 mg/dL   Creatinine, Ser 1.21 0.61 - 1.24 mg/dL   Calcium 8.3 (L) 8.9 - 10.3 mg/dL   GFR calc non Af Amer 59 (L) >60 mL/min   GFR calc Af Amer >60 >60 mL/min   Anion gap 5 5 - 15  Protime-INR  Result Value Ref Range   Prothrombin Time 16.1 (H) 11.6 - 15.2 seconds   INR 1.28 0.00 - 1.49  CBC  Result Value Ref Range   WBC 8.4 4.0 - 10.5 K/uL   RBC 3.72 (L) 4.22 - 5.81 MIL/uL   Hemoglobin 12.0 (L) 13.0 - 17.0 g/dL   HCT 37.0 (L) 39.0 - 52.0 %   MCV 99.5 78.0 - 100.0 fL   MCH 32.3 26.0 - 34.0 pg   MCHC 32.4 30.0 - 36.0 g/dL   RDW 12.2 11.5 - 15.5 %   Platelets 117 (L) 150 - 400 K/uL  Protime-INR  Result Value Ref Range   Prothrombin Time 17.1 (  H) 11.6 - 15.2 seconds   INR 1.39 0.00 - 1.49  CBC  Result Value Ref Range   WBC 8.4 4.0 - 10.5 K/uL   RBC 3.72 (L) 4.22 - 5.81 MIL/uL   Hemoglobin 11.9 (L) 13.0 - 17.0 g/dL   HCT 36.2 (L) 39.0 - 52.0 %   MCV 97.3 78.0 - 100.0 fL   MCH 32.0 26.0 - 34.0 pg   MCHC 32.9 30.0 - 36.0 g/dL   RDW 12.0 11.5 - 15.5 %   Platelets 122 (L) 150 - 400 K/uL  Protime-INR  Result Value Ref Range   Prothrombin Time 19.9 (H) 11.6 - 15.2 seconds   INR 1.70 (H) 0.00 - 1.49    Discharge Medications:     Medication List    STOP taking these medications        BELSOMRA 10 MG Tabs  Generic drug:  Suvorexant      TAKE these medications        aspirin EC 81 MG tablet  Take 2 tablets daily.     atorvastatin 10 MG tablet  Commonly known as:  LIPITOR  Take 1 tablet by mouth  daily     clopidogrel 75 MG tablet  Commonly known as:  PLAVIX  Take 1 tablet by mouth  daily with breakfast     diclofenac 75 MG EC tablet  Commonly known as:  VOLTAREN  Take 75 mg by mouth daily.     diltiazem 240 MG 24 hr capsule  Commonly known as:  TIAZAC  Take 1 capsule (240 mg total) by mouth daily.     methocarbamol 500 MG tablet  Commonly known as:  ROBAXIN  Take 1 tablet (500 mg  total) by mouth 3 (three) times daily as needed.     multivitamin with minerals tablet  Take 1 tablet by mouth daily.     oxyCODONE-acetaminophen 5-325 MG tablet  Commonly known as:  ROXICET  Take 1-2 tablets by mouth every 4 (four) hours as needed for severe pain.     valsartan-hydrochlorothiazide 320-12.5 MG tablet  Commonly known as:  DIOVAN-HCT  Take 1 tablet by mouth daily.     warfarin 5 MG tablet  Commonly known as:  COUMADIN  Take 1 tablet (5 mg total) by mouth daily. Take as directed per the pharmacist for INR target from 2.5-3.0 for 30 days post op     zolpidem 10 MG tablet  Commonly known as:  AMBIEN  Take 10 mg by mouth at bedtime as needed for sleep.        Diagnostic Studies: Dg Knee Left Port  07/07/2015  CLINICAL DATA:  69 year old male status post left knee replacement EXAM: PORTABLE LEFT KNEE - 1-2 VIEW COMPARISON:  None. FINDINGS: Surgical changes of left total knee replacement. No evidence of immediate hardware complication. Expected intra-articular gas in the postop setting. Normal bony mineralization. No lytic or blastic osseous lesion. IMPRESSION: Surgical changes of total knee arthroplasty without evidence of immediate hardware complication. Electronically Signed   By: Jacqulynn Cadet M.D.   On: 07/07/2015 10:33    Disposition: Final discharge disposition not confirmed        Follow-up Information    Follow up with NORRIS,STEVEN R, MD. Call in 2 weeks.   Specialty:  Orthopedic Surgery   Why:  [phone]   Contact information:   [address] [place] [address] [phone]       Follow up with [facility].   Why:  Home health for Physical therapy, occupational therapy; agency above will call you to arrange visit. Please call the number listed above for questions.    Contact information:   [address] [place] [address] [phone]        Signed: Ventura Bruns 07/10/2015, 9:50 AM

## 2015-07-10 NOTE — Progress Notes (Signed)
   Subjective: 3 Days Post-Op Procedure(s) (LRB): LEFT TOTAL KNEE ARTHROPLASTY (Left)  Pt doing very well Ready for d/c home Denies any new symptoms or issues Patient reports pain as mild.  Objective:   VITALS:   Filed Vitals:   07/10/15 0533  BP: 104/58  Pulse: 57  Temp: 97.9 F (36.6 C)  Resp: 15    Left knee incision healing well nv intact distally No rashes or edema  LABS  Recent Labs  07/08/15 0508 07/09/15 0222 07/10/15 0436  HGB 13.1 12.0* 11.9*  HCT 40.8 37.0* 36.2*  WBC 7.4 8.4 8.4  PLT 133* 117* 122*     Recent Labs  07/07/15 1418 07/08/15 0508  NA  --  140  K  --  4.0  BUN  --  11  CREATININE 1.40* 1.21  GLUCOSE  --  120*     Assessment/Plan: 3 Days Post-Op Procedure(s) (LRB): LEFT TOTAL KNEE ARTHROPLASTY (Left) D/c home today  F/u in 2 weeks      Merla Riches, MPAS, PA-C  07/10/2015, 9:49 AM

## 2015-07-10 NOTE — Progress Notes (Signed)
Occupational Therapy Treatment Patient Details Name: Alex Alvarez MRN: [id number] DOB: 04/08/1946 Today's Date: 07/10/2015    History of present illness Pt admitted for Left TKA. 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 OT educated pt on increased I with LB dressing this session. Pt did not need use of AE but did require modification of circle sitting on bed in order to don L sock and shoe this session. Daughter present in room at end of session. Pt and daughter with no further OT questions or concerns as therapist exits the room.   Follow Up Recommendations  Home health OT;Supervision/Assistance - 24 hour    Equipment Recommendations  None recommended by OT    Recommendations for Other Services      Precautions / Restrictions Precautions Precautions: Fall;Knee Precaution Comments: educated on knee precautions Restrictions Weight Bearing Restrictions: Yes LLE Weight Bearing: Weight bearing as tolerated       Mobility Bed Mobility Overal bed mobility: Modified Independent             General bed mobility comments: Pt seated in recliner chair upon entering the room.   Transfers Overall transfer level: Needs assistance Equipment used: Rolling walker (2 wheeled) Transfers: Sit to/from Stand Sit to Stand: Supervision Stand pivot transfers: Supervision       General transfer comment: supervision for safety with min verbal cues for hand placement.    Balance Overall balance assessment: Needs assistance Sitting-balance support: Feet supported Sitting balance-Leahy Scale: Good     Standing balance support: During functional activity;Single extremity supported Standing balance-Leahy Scale: Fair Standing balance comment: relies on RW for support with dynamic task                   ADL Overall ADL's : Needs assistance/impaired                 Upper Body  Dressing : Supervision/safety;Sitting       Toilet Transfer: Supervision/safety;Regular Toilet;RW   Toileting- Clothing Manipulation and Hygiene: Supervision/safety;Sit to/from stand         General ADL Comments: Pt performed UB and LB dressing while seated on EOB. Pt educated on bending forward as well as circle sitting as needed in order to increase I for LB dressing. Pt able to don and clothing with supervision for safety. Pt ambulated short distance of 10' with RW and supervision to bathroom for toileting with supervision on standard toilet. Pt returning to sit in recliner chair with daughter present in room.                 Cognition   Behavior During Therapy: WFL for tasks assessed/performed Overall Cognitive Status: Within Functional Limits for tasks assessed                                    Pertinent Vitals/ Pain       Pain Assessment: 0-10 Pain Score: 2  Pain Location: L knee Pain Descriptors / Indicators: Sore Pain Intervention(s): Monitored during session;Repositioned         Frequency Min 2X/week     Progress Toward Goals  OT Goals(current goals can now be found in the care plan section)  Progress towards OT goals: Progressing toward goals     Plan Discharge plan remains appropriate  End of Session Equipment Utilized During Treatment: Rolling walker;Left knee immobilizer CPM Left Knee CPM Left Knee: Off   Activity Tolerance Patient tolerated treatment well   Patient Left in chair;with call bell/phone within reach   Nurse Communication Mobility status        Time: 9432-7614 OT Time Calculation (min): 20 min  Charges: OT Treatments $Self Care/Home Management : 8-22 mins  Phineas Semen, MS, OTR/L 07/10/2015, 9:24 AM

## 2015-07-21 ENCOUNTER — Other Ambulatory Visit (HOSPITAL_COMMUNITY): Payer: Self-pay | Admitting: Orthopedic Surgery

## 2015-07-21 ENCOUNTER — Ambulatory Visit (HOSPITAL_COMMUNITY)
Admission: RE | Admit: 2015-07-21 | Discharge: 2015-07-21 | Disposition: A | Discharge status: Home / Self Care | Payer: Medicare Other | Source: Ambulatory Visit | Attending: Cardiovascular Disease | Admitting: Cardiovascular Disease

## 2015-07-21 DIAGNOSIS — M79605 Pain in left leg: Secondary | ICD-10-CM | POA: Diagnosis not present

## 2015-07-21 DIAGNOSIS — I10 Essential (primary) hypertension: Secondary | ICD-10-CM | POA: Diagnosis not present

## 2015-08-23 ENCOUNTER — Encounter (HOSPITAL_BASED_OUTPATIENT_CLINIC_OR_DEPARTMENT_OTHER): Payer: Self-pay | Admitting: *Deleted

## 2015-08-23 ENCOUNTER — Emergency Department (HOSPITAL_BASED_OUTPATIENT_CLINIC_OR_DEPARTMENT_OTHER)
Admission: EM | Admit: 2015-08-23 | Discharge: 2015-08-23 | Disposition: A | Discharge status: Home / Self Care | Payer: Medicare Other | Attending: Emergency Medicine | Admitting: Emergency Medicine

## 2015-08-23 ENCOUNTER — Emergency Department (HOSPITAL_BASED_OUTPATIENT_CLINIC_OR_DEPARTMENT_OTHER): Payer: Medicare Other

## 2015-08-23 DIAGNOSIS — Z87891 Personal history of nicotine dependence: Secondary | ICD-10-CM | POA: Insufficient documentation

## 2015-08-23 DIAGNOSIS — Z79899 Other long term (current) drug therapy: Secondary | ICD-10-CM | POA: Insufficient documentation

## 2015-08-23 DIAGNOSIS — Z7902 Long term (current) use of antithrombotics/antiplatelets: Secondary | ICD-10-CM | POA: Diagnosis not present

## 2015-08-23 DIAGNOSIS — R05 Cough: Secondary | ICD-10-CM | POA: Insufficient documentation

## 2015-08-23 DIAGNOSIS — I482 Chronic atrial fibrillation: Secondary | ICD-10-CM | POA: Diagnosis not present

## 2015-08-23 DIAGNOSIS — Z85818 Personal history of malignant neoplasm of other sites of lip, oral cavity, and pharynx: Secondary | ICD-10-CM | POA: Insufficient documentation

## 2015-08-23 DIAGNOSIS — Z85828 Personal history of other malignant neoplasm of skin: Secondary | ICD-10-CM | POA: Diagnosis not present

## 2015-08-23 DIAGNOSIS — I10 Essential (primary) hypertension: Secondary | ICD-10-CM | POA: Insufficient documentation

## 2015-08-23 DIAGNOSIS — R059 Cough, unspecified: Secondary | ICD-10-CM

## 2015-08-23 DIAGNOSIS — Z7901 Long term (current) use of anticoagulants: Secondary | ICD-10-CM | POA: Insufficient documentation

## 2015-08-23 DIAGNOSIS — R0981 Nasal congestion: Secondary | ICD-10-CM

## 2015-08-23 DIAGNOSIS — R011 Cardiac murmur, unspecified: Secondary | ICD-10-CM | POA: Insufficient documentation

## 2015-08-23 DIAGNOSIS — Z9889 Other specified postprocedural states: Secondary | ICD-10-CM | POA: Insufficient documentation

## 2015-08-23 DIAGNOSIS — Z7982 Long term (current) use of aspirin: Secondary | ICD-10-CM | POA: Diagnosis not present

## 2015-08-23 DIAGNOSIS — E669 Obesity, unspecified: Secondary | ICD-10-CM | POA: Diagnosis not present

## 2015-08-23 DIAGNOSIS — R0989 Other specified symptoms and signs involving the circulatory and respiratory systems: Secondary | ICD-10-CM | POA: Diagnosis not present

## 2015-08-23 MED ORDER — TRIAMCINOLONE ACETONIDE 55 MCG/ACT NA AERO: 2.0000 | INHALATION_SPRAY | Freq: Every day | NASAL | Status: DC

## 2015-08-23 MED ORDER — CHLORPHENIRAMINE-PHENYLEPHRINE 1-3.5 MG/ML PO LIQD: 0.7500 mL | Freq: Four times a day (QID) | ORAL | Status: DC | PRN

## 2015-08-23 NOTE — ED Notes (Signed)
Patient states he has a three day history of sinus congestion and drainage, with a productive cough with yellow secretions, and a sore throat.  Denies fever.

## 2015-08-23 NOTE — ED Notes (Signed)
My first BP reading was 166/125, so I took again with larger cuff and got result of 158/125. Dr. Haig Prophet was in room and is aware of BP.

## 2015-08-23 NOTE — Discharge Instructions (Signed)
As discussed, your evaluation today has been largely reassuring.  But, it is important that you monitor your condition carefully, and do not hesitate to return to the ED if you develop new, or concerning changes in your condition.  For cough congestion are likely due to sinus inflammation.  Please use the prescribed medication as directed  Please follow-up with your physician for appropriate ongoing care.

## 2015-08-23 NOTE — ED Provider Notes (Signed)
CSN: [id number]     Arrival date & time 08/23/15  1120 History   First MD Initiated Contact with Patient 08/23/15 1124     Chief Complaint  Patient presents with  . Cough     (Consider location/radiation/quality/duration/timing/severity/associated sxs/prior Treatment) HPI Patient presents with concern of cough, congestion. The cough has been productive of yellow secretion There is associated sore throat.  No fever, chills, nausea, vomiting, diarrhea. Patient went to urgent care today, was sent here for evaluation. Patient denies a history of congestive heart failure, renal or hepatic dysfunction. Minimal relief with OTC medication.   Past Medical History  Diagnosis Date  . Hypertension   . Chronic atrial fibrillation ([place]) 8/132014 office note    chronic afib dates back to June 2012; CHADS2-VASc score 2; candidate for dual  antiplatelet therapy based on active C and active S STUDIES  for thromboembolic prophyaxis--actually ot on coumadin but on aspirin ad plavix  . Former light tobacco smoker      smoked a pipe; quit 25+ years ago   . Obesity (BMI 30.0-34.9)   . Dysrhythmia   . Heart murmur   . 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 Transthoracic echocardiogram  02/22/2011    EF 50-55%; with mild inferior wall hypokinesis, mild left atrial enlargement of 4.2cm  . Nm myoview ltd  07/30/2011    non-gated study seconadry to a fib;exercise capcity 7 mets--mildly reduced;low risk scan  . Aorta doppler  11/12/2012    normal abdomnal aortic duplex   . Transthoracic echocardiogram  11/12/12    afib; EF 50-55%  . Tonsillectomy    . Total knee arthroplasty Left 07/07/2015    Procedure: LEFT TOTAL KNEE ARTHROPLASTY;  Surgeon: Netta Cedars, MD;  Location: [facility];  Service: Orthopedics;  Laterality: Left;  . Joint replacement Left Jul 07, 2015  . Skin biopsy N/A sept 2016    Basal cell  from face.   Family History  Problem Relation Age of Onset  . Cancer - Ovarian Mother   . Cancer Maternal Grandfather    Social History  Substance Use Topics  . Smoking status: Former Smoker    Types: Pipe    Quit date: 10/24/2011  . Smokeless tobacco: Never Used  . Alcohol Use: No    Review of Systems  Constitutional:       Per HPI, otherwise negative  HENT:       Per HPI, otherwise negative  Respiratory:       Per HPI, otherwise negative  Cardiovascular:       Per HPI, otherwise negative  Gastrointestinal: Negative for vomiting.  Endocrine:       Negative aside from HPI  Genitourinary:       Neg aside from HPI   Musculoskeletal:       Per HPI, otherwise negative  Skin: Negative.   Neurological: Negative for syncope.      Allergies  Lipitor and Lamisil  Home Medications   Prior to Admission medications   Medication Sig Start Date End Date Taking? Authorizing Provider  aspirin EC 81 MG tablet Take 2 tablets daily.   Yes Historical Provider, MD  atorvastatin (LIPITOR) 10 MG tablet Take 1 tablet by mouth  daily 07/04/15  Yes Leonie Man, MD  clopidogrel (PLAVIX) 75 MG tablet Take 1 tablet by mouth  daily with breakfast 03/20/15  Yes Shanon Brow  Loren Racer, MD  diclofenac (VOLTAREN) 75 MG EC tablet Take 75 mg by mouth daily.   Yes Historical Provider, MD  diltiazem (TIAZAC) 240 MG 24 hr capsule Take 1 capsule (240 mg total) by mouth daily. 06/26/15  Yes Leonie Man, MD  Multiple Vitamins-Minerals (MULTIVITAMIN WITH MINERALS) tablet Take 1 tablet by mouth daily.   Yes Historical Provider, MD  valsartan-hydrochlorothiazide (DIOVAN-HCT) 320-12.5 MG per tablet Take 1 tablet by mouth daily.   Yes Historical Provider, MD  zolpidem (AMBIEN) 10 MG tablet Take 10 mg by mouth at bedtime as needed for sleep.   Yes Historical Provider, MD  methocarbamol (ROBAXIN) 500 MG tablet Take 1 tablet (500 mg total) by mouth 3 (three) times daily as needed. 07/07/15   Netta Cedars, MD   oxyCODONE-acetaminophen (ROXICET) 5-325 MG tablet Take 1-2 tablets by mouth every 4 (four) hours as needed for severe pain. 07/07/15   Netta Cedars, MD  warfarin (COUMADIN) 5 MG tablet Take 1 tablet (5 mg total) by mouth daily. Take as directed per the pharmacist for INR target from 2.5-3.0 for 30 days post op 07/07/15   Netta Cedars, MD   BP 142/114 mmHg  Pulse 60  Temp(Src) 98.4 F (36.9 C) (Oral)  Resp 20  SpO2 98% Physical Exam  Constitutional: He is oriented to person, place, and time. He appears well-developed. No distress.  HENT:  Head: Normocephalic and atraumatic.  Eyes: Conjunctivae and EOM are normal.  Cardiovascular: Normal rate and regular rhythm.   Pulmonary/Chest: No stridor. He has decreased breath sounds.  Abdominal: He exhibits no distension.  Musculoskeletal: He exhibits no edema.  Neurological: He is alert and oriented to person, place, and time.  Skin: Skin is warm and dry.  Psychiatric: He has a normal mood and affect.  Nursing note and vitals reviewed.   ED Course  Procedures (including critical care time) Labs Review Labs Reviewed - No data to display  Imaging Review Dg Chest 2 View  08/23/2015  CLINICAL DATA:  Cough and chest congestion, 3 days duration. EXAM: CHEST  2 VIEW COMPARISON:  11/06/2007. FINDINGS: Heart size is normal. There is chronic elevation of the left hemidiaphragm. Mild chronic volume loss at the left base secondary to that. Lungs are otherwise clear. No evidence of heart failure or effusion. IMPRESSION: Chronic elevation of left hemidiaphragm with mild chronic volume loss at the left base related to that. No acute process. Electronically Signed   By: Nelson Chimes M.D.   On: 08/23/2015 11:57   I have personally reviewed and evaluated these images and lab results as part of my medical decision-making.  Oximetry 99% room air normal   On repeat exam the patient is in no distress.  MDM  Pleasant elderly M in NAD p/w 3d of cough,  congestion. Here, he is in no pain, w no e/o distress / pna / other acute life threatening processes.  Patient started on a course of anti-tussives, and inhaled steroids for congestion.  Patient discharged in stable condition.  Carmin Muskrat, MD 08/23/15 [phone]

## 2015-12-18 DIAGNOSIS — F5104 Psychophysiologic insomnia: Secondary | ICD-10-CM | POA: Insufficient documentation

## 2016-01-10 IMAGING — DX DG CHEST 2V
2 series · 2 of 2 positions shown · non-contrast
Comparison: 11/06/2007.

CLINICAL DATA: Cough and chest congestion, 3 days duration.

EXAM:
CHEST  2 VIEW

[chest pa]
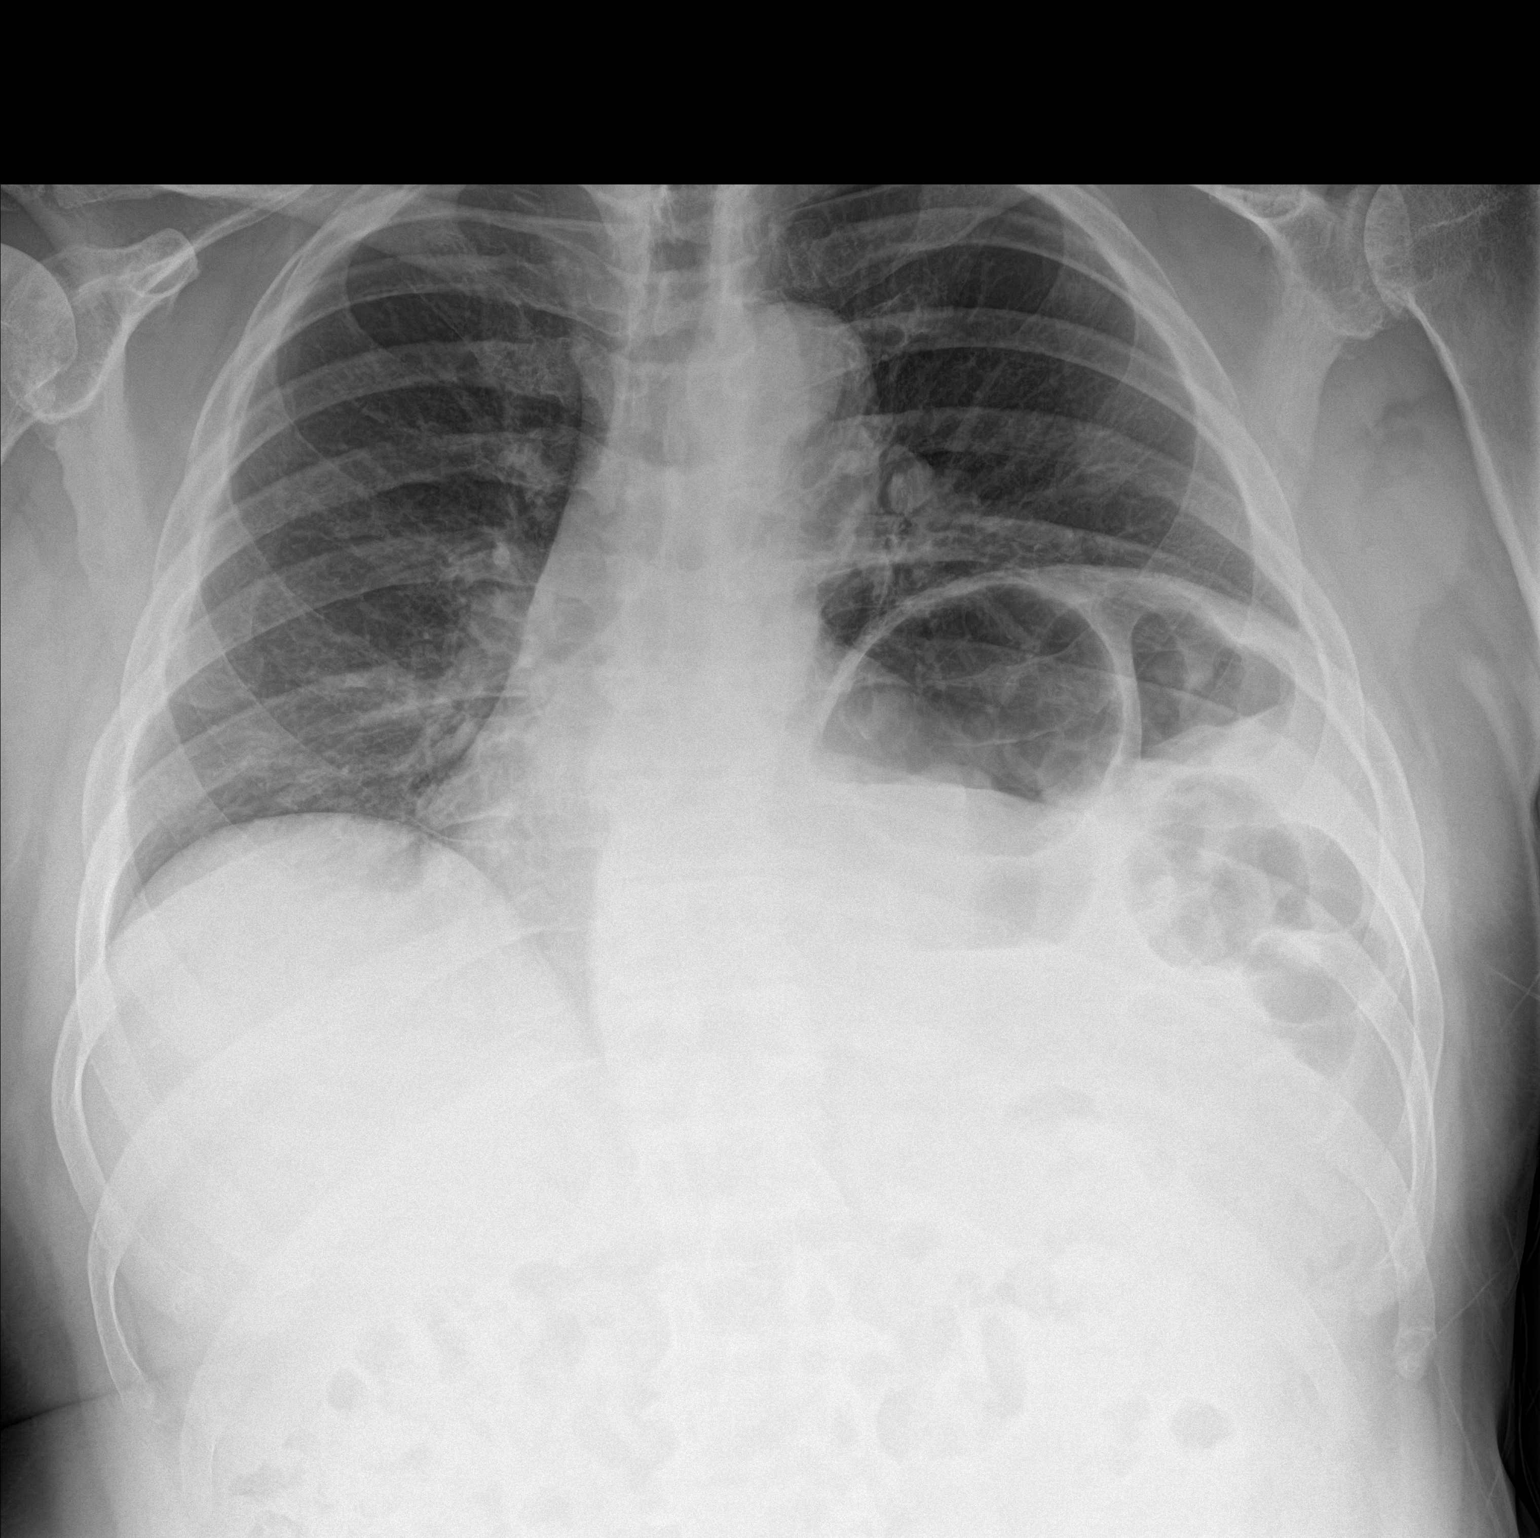

[chest lat]
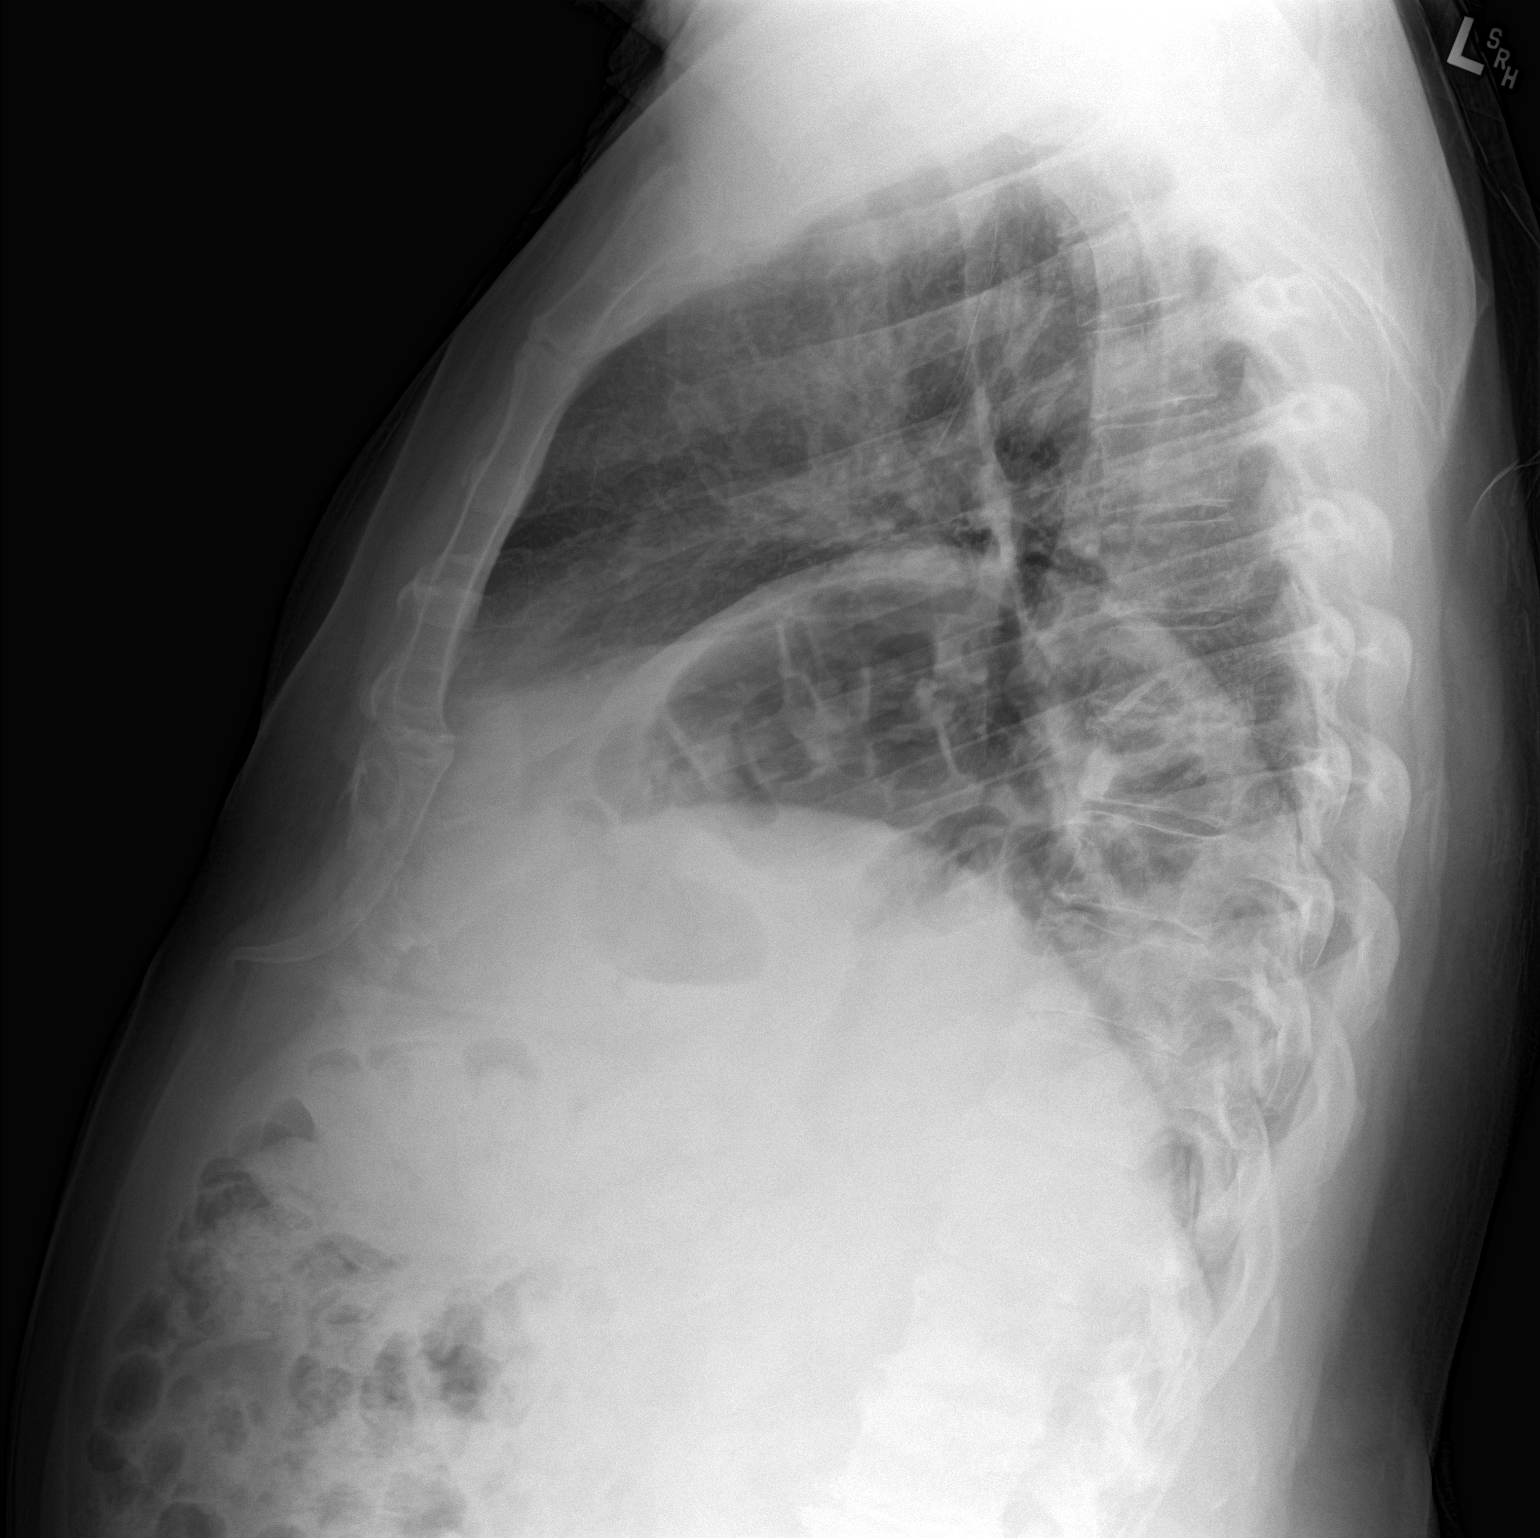

[2 of 2 positions shown; findings below may reference images not displayed]

FINDINGS: Heart size is normal. There is chronic elevation of the left
hemidiaphragm. Mild chronic volume loss at the left base secondary
to that. Lungs are otherwise clear. No evidence of heart failure or
effusion.
IMPRESSION: Chronic elevation of left hemidiaphragm with mild chronic volume
loss at the left base related to that. No acute process.

## 2016-03-18 ENCOUNTER — Other Ambulatory Visit: Payer: Self-pay | Admitting: Cardiology

## 2016-03-20 ENCOUNTER — Encounter: Payer: Self-pay | Admitting: Cardiovascular Disease

## 2016-03-20 ENCOUNTER — Ambulatory Visit (INDEPENDENT_AMBULATORY_CARE_PROVIDER_SITE_OTHER): Payer: Medicare Other | Admitting: Cardiovascular Disease

## 2016-03-20 VITALS — BP 150/98 | HR 76 | Ht 72.0 in | Wt 238.6 lb

## 2016-03-20 DIAGNOSIS — E785 Hyperlipidemia, unspecified: Secondary | ICD-10-CM | POA: Diagnosis not present

## 2016-03-20 DIAGNOSIS — I482 Chronic atrial fibrillation, unspecified: Secondary | ICD-10-CM

## 2016-03-20 DIAGNOSIS — I10 Essential (primary) hypertension: Secondary | ICD-10-CM | POA: Diagnosis not present

## 2016-03-20 NOTE — Assessment & Plan Note (Signed)
History of hyperlipidemia on statin drug followed by his PCP.

## 2016-03-20 NOTE — Assessment & Plan Note (Signed)
History of chronic A. fib on aspirin and Plavix per Dr. Ellyn Hack. He is rate controlled.

## 2016-03-20 NOTE — Patient Instructions (Signed)
Medication Instructions:  Your physician recommends that you continue on your current medications as directed. Please refer to the Current Medication list given to you today.   Follow-Up: Your physician wants you to follow-up in: [place]. You will receive a reminder letter in the mail two months in advance. If you don't receive a letter, please call our office to schedule the follow-up appointment.  If you need a refill on your cardiac medications before your next appointment, please call your pharmacy.

## 2016-03-20 NOTE — Progress Notes (Signed)
03/20/2016 Alex Alvarez   09-17-1945  [id number]  Primary Physician Woody Seller, MD Primary Cardiologist: Lorretta Harp MD Renae Gloss  HPI:  Mr. 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 He is a cardiology patient of Dr. Allison Quarry. She is being seen today by me for a routine one year follow-up. He has a history of treated hypertension, hyperlipidemia and chronic atrial fibrillation on aspirin and Plavix per Dr. Ellyn Hack and prior to that Dr. Rollene Fare. Otherwise, he has no prior cardiac history. He has never had a  heart attack or stroke. Denies chest pain or shortness of breath. He did have an uncomplicated total knee replacement last year.   Current Outpatient Prescriptions  Medication Sig Dispense Refill  . aspirin EC 81 MG tablet Take 2 tablets daily.    [name] atorvastatin (LIPITOR) 10 MG tablet Take 1 tablet by mouth  daily 90 tablet 2  . clopidogrel (PLAVIX) 75 MG tablet Take 1 tablet by mouth  daily with breakfast 90 tablet 3  . diclofenac (VOLTAREN) 75 MG EC tablet Take 75 mg by mouth daily.    [name] diltiazem (TIAZAC) 240 MG 24 hr capsule Take 1 capsule (240 mg total) by mouth daily. 90 capsule 2  . Multiple Vitamins-Minerals (MULTIVITAMIN WITH MINERALS) tablet Take 1 tablet by mouth daily.    [name] zolpidem (AMBIEN) 10 MG tablet Take 10 mg by mouth at bedtime as needed for sleep.    . chlorpheniramine-phenylephrine (CARDEC) 1-3.5 MG/ML LIQD Take 0.75 mLs by mouth 4 (four) times daily as needed. (Patient not taking: Reported on 03/20/2016) 30 mL 0  . valsartan-hydrochlorothiazide (DIOVAN-HCT) 320-12.5 MG per tablet Take 1 tablet by mouth daily. Reported on 03/20/2016    . warfarin (COUMADIN) 5 MG tablet Take 1 tablet (5 mg total) by mouth daily. Take as directed per the pharmacist for INR target from 2.5-3.0 for 30 days post op (Patient not taking: Reported on  03/20/2016) 40 tablet 0   No current facility-administered medications for this visit.    Allergies  Allergen Reactions  . Lipitor [Atorvastatin] Other (See Comments)    Leg cramps  . Lamisil [Terbinafine Hcl] Rash    Social History   Social History  . Marital Status: Married    Spouse Name: N/A  . Number of Children: N/A  . Years of Education: N/A   Occupational History  . Not on file.   Social History Main Topics  . Smoking status: Former Smoker    Types: Pipe    Quit date: 10/24/2011  . Smokeless tobacco: Never Used  . Alcohol Use: No  . Drug Use: No  . Sexual Activity: Not on file   Other Topics Concern  . Not on file   Social History Narrative   He is a full-time Optometrist for [employer].   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   He is active and ambulatory, walking on a regular basis roughly 3-5 days a week 3 miles a time.   He used to smoke a pipe but quit 25 years ago.     Review of Systems: General: negative for chills, fever, night sweats or weight changes.  Cardiovascular: negative for chest pain, dyspnea on exertion, edema, orthopnea, palpitations, paroxysmal nocturnal dyspnea or shortness of breath Dermatological: negative for rash Respiratory: negative for cough or wheezing Urologic: negative for hematuria Abdominal: negative for  nausea, vomiting, diarrhea, bright red blood per rectum, melena, or hematemesis Neurologic: negative for visual changes, syncope, or dizziness All other systems reviewed and are otherwise negative except as noted above.    Blood pressure 150/98, pulse 76, height 6' (1.829 m), weight 238 lb 9.6 oz (108.228 kg).  General appearance: alert and no distress Neck: no adenopathy, no carotid bruit, no JVD, supple, symmetrical, trachea midline and thyroid not enlarged, symmetric, no tenderness/mass/nodules Lungs: clear to auscultation bilaterally Heart: irregularly irregular rhythm Extremities: extremities normal,  atraumatic, no cyanosis or edema  EKG atrial fibrillation with a ventricular response of 76. I personally reviewed this EKG  ASSESSMENT AND PLAN:   Essential hypertension History of hypertension with blood pressure measured at 150/98. He is on diltiazem, Diovan hydrochlorothiazide. Diastolics are a little higher than they've been in the past. He will follow-up with his primary care physician and with Dr. Ellyn Hack in one year for further evaluation  Chronic atrial fibrillation ([place]) History of chronic A. fib on aspirin and Plavix per Dr. Ellyn Hack. He is rate controlled.  Dyslipidemia - on statin; monitored by PCP History of hyperlipidemia on statin drug followed by his PCP.      Lorretta Harp MD FACP,FACC,FAHA, [facility] 03/20/2016 10:25 AM

## 2016-03-20 NOTE — Assessment & Plan Note (Signed)
History of hypertension with blood pressure measured at 150/98. He is on diltiazem, Diovan hydrochlorothiazide. Diastolics are a little higher than they've been in the past. He will follow-up with his primary care physician and with Dr. Ellyn Hack in one year for further evaluation

## 2016-04-04 ENCOUNTER — Other Ambulatory Visit: Payer: Self-pay | Admitting: Cardiology

## 2016-04-30 ENCOUNTER — Other Ambulatory Visit: Payer: Self-pay | Admitting: Cardiology

## 2016-04-30 NOTE — Telephone Encounter (Signed)
Rx request sent to pharmacy.  

## 2016-06-26 DIAGNOSIS — M2550 Pain in unspecified joint: Secondary | ICD-10-CM | POA: Insufficient documentation

## 2016-12-25 DIAGNOSIS — B351 Tinea unguium: Secondary | ICD-10-CM | POA: Insufficient documentation

## 2017-03-20 ENCOUNTER — Ambulatory Visit (INDEPENDENT_AMBULATORY_CARE_PROVIDER_SITE_OTHER): Payer: Medicare Other | Admitting: Cardiology

## 2017-03-20 ENCOUNTER — Encounter: Payer: Self-pay | Admitting: Cardiology

## 2017-03-20 VITALS — BP 176/112 | HR 72 | Ht 72.0 in | Wt 240.4 lb

## 2017-03-20 DIAGNOSIS — I872 Venous insufficiency (chronic) (peripheral): Secondary | ICD-10-CM

## 2017-03-20 DIAGNOSIS — E785 Hyperlipidemia, unspecified: Secondary | ICD-10-CM | POA: Diagnosis not present

## 2017-03-20 DIAGNOSIS — I4811 Longstanding persistent atrial fibrillation: Secondary | ICD-10-CM

## 2017-03-20 DIAGNOSIS — E669 Obesity, unspecified: Secondary | ICD-10-CM | POA: Diagnosis not present

## 2017-03-20 DIAGNOSIS — I481 Persistent atrial fibrillation: Secondary | ICD-10-CM

## 2017-03-20 DIAGNOSIS — I10 Essential (primary) hypertension: Secondary | ICD-10-CM | POA: Diagnosis not present

## 2017-03-20 NOTE — Patient Instructions (Signed)
Medication Instructions: No changes   Follow-Up: Please follow up with [facility] in 3-4 weeks for a blood pressure check and possible medication change  Your physician wants you to follow-up in: 12 months with Dr. Ellyn Hack. You will receive a reminder letter in the mail two months in advance. If you don't receive a letter, please call our office to schedule the follow-up appointment.   Any Additional Special Instructions Will Be Listed Below (If Applicable).  Please check your blood pressure at home in the morning or afternoon. Keep a log of the readings and bring them to your next appointment.     If you need a refill on your cardiac medications before your next appointment, please call your pharmacy.

## 2017-03-20 NOTE — Progress Notes (Signed)
PCP: Christain [place], MD  Clinic Note: Chief Complaint  Patient presents with  . Follow-up    Atrial fibrillation    HPI: Alex Alvarez is a 71 y.o. male with a PMH below who presents today for follow-up of atrial fibrillation and hypertension as well as dyslipidemia. He also has lower extremity edema from venous stasis. He is a former patient of Dr. Rollene Fare whom I first saw in April of 2015.Alex Alvarez After detailed discussions with Dr. Rollene Fare during his last couple visits, they decided to start him on aspirin plus Plavix based on a CHADS2-VASc score of 2.  He was converted from simvastatin to atorvastatin to avoid interaction with diltiazem.  CHARISTOPHER RUMBLE was last seen on 03/20/2016 by Dr. Gwenlyn Found for hypertension follow-up. Despite having a blood pressure 150/98, no changes were made.  Recent Hospitalizations: None  Studies Personally Reviewed - (if available, images/films reviewed: From Epic Chart or Care Everywhere)  None  Interval History: Mikki Santee presents today overall doing fairly well. He has no major complaints. He only says that he notices atrial fibrillation as an irregular heartbeat after he has been exerting for a while and his heart rate goes up. Is not really notes his heart rate going fast is just a few irregularity at that time using when he first sits down. He denies any chest tightness pressure with rest or exertion. He denies any lightheadedness, dizziness, syncope/ near syncope.No TIA/amaurosis fugax symptoms. No bleeding issues with aspirin and Plavix -- no melena, hematochezia, hematuria, or epstaxis. No PND, orthopnea.  Edema well-controlled using his support stockings or socks. Not having to take any Diuretic.  No claudication  ROS: A comprehensive was performed. Review of Systems  Constitutional: Negative for malaise/fatigue.  HENT: Negative for congestion and nosebleeds.   Eyes: Negative for blurred vision.  Respiratory: Negative for cough, shortness of breath  and wheezing.   Gastrointestinal: Negative for nausea and vomiting.  Musculoskeletal: Negative for joint pain and myalgias.  Neurological: Negative for dizziness and headaches.  Psychiatric/Behavioral: Negative for depression and memory loss. The patient is not nervous/anxious and does not have insomnia.   All other systems reviewed and are negative.  I have reviewed and (if needed) personally updated the patient's problem list, medications, allergies, past medical and surgical history, social and family history.   Past Medical History:  Diagnosis Date  . 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 Chronic atrial fibrillation ([place]) 8/132014 office note   chronic afib dates back to June 2012; CHADS2-VASc score 2; candidate for dual  antiplatelet therapy based on active C and active S STUDIES  for thromboembolic prophyaxis--actually ot on coumadin but on aspirin ad plavix  . Dysrhythmia   . Former light tobacco smoker     smoked a pipe; quit 25+ years ago   . Heart murmur   . Hypertension   . Obesity (BMI 30.0-34.9)     Past Surgical History:  Procedure Laterality Date  . Aorta doppler  11/12/2012   normal abdomnal aortic duplex   . JOINT REPLACEMENT Left Jul 07, 2015  . NM MYOVIEW LTD  07/30/2011   non-gated study seconadry to a fib;exercise capcity 7 mets--mildly reduced;low risk scan  . SKIN BIOPSY N/A sept 2016   Basal cell from face.  . TONSILLECTOMY    . TOTAL KNEE ARTHROPLASTY Left 07/07/2015   Procedure: LEFT TOTAL KNEE ARTHROPLASTY;  Surgeon: Netta Cedars, MD;  Location: [facility];  Service: Orthopedics;  Laterality: Left;  . TRANSTHORACIC ECHOCARDIOGRAM  02/22/2011   EF 50-55%; with mild inferior wall hypokinesis, mild left atrial enlargement of 4.2cm  . TRANSTHORACIC ECHOCARDIOGRAM  11/12/12   afib; EF 50-55%    Current Meds  Medication Sig  . aspirin EC 81 MG tablet Take 2 tablets daily.  Alex Alvarez atorvastatin (LIPITOR) 10 MG tablet Take  1 tablet by mouth  daily  . chlorpheniramine-phenylephrine (CARDEC) 1-3.5 MG/ML LIQD Take 0.75 mLs by mouth 4 (four) times daily as needed.  . clopidogrel (PLAVIX) 75 MG tablet Take 1 tablet by mouth  daily with breakfast  . diclofenac (VOLTAREN) 75 MG EC tablet Take 75 mg by mouth daily.  Alex Alvarez diltiazem (TIAZAC) 240 MG 24 hr capsule Take 1 capsule (240 mg total) by mouth daily. Please schedule appointment for refills.  . Multiple Vitamins-Minerals (MULTIVITAMIN WITH MINERALS) tablet Take 1 tablet by mouth daily.  . valsartan-hydrochlorothiazide (DIOVAN-HCT) 320-12.5 MG per tablet Take 1 tablet by mouth daily. Reported on 03/20/2016  . zolpidem (AMBIEN) 10 MG tablet Take 10 mg by mouth at bedtime as needed for sleep.    Allergies  Allergen Reactions  . Lipitor [Atorvastatin] Other (See Comments)    Leg cramps  . Lamisil [Terbinafine Hcl] Rash    Social History   Social History  . Marital status: Married    Spouse name: N/A  . Number of children: N/A  . Years of education: N/A   Social History Main Topics  . Smoking status: Former Smoker    Types: Pipe    Quit date: 10/24/2011  . Smokeless tobacco: Never Used  . Alcohol use No  . Drug use: No  . Sexual activity: Not Asked   Other Topics Concern  . 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   He is active and ambulatory, walking on a regular basis roughly 3-5 days a week 3 miles a time.   He used to smoke a pipe but quit 25 years ago.    family history includes Cancer in his maternal grandfather; Cancer - Ovarian in his mother.  Wt Readings from Last 3 Encounters:  03/20/17 240 lb 6.4 oz (109 kg)  03/20/16 238 lb 9.6 oz (108.2 kg)  07/07/15 237 lb (107.5 kg)    PHYSICAL EXAM BP (!) 176/112   Pulse 72   Ht 6' (1.829 m)   Wt 240 lb 6.4 oz (109 kg)   BMI 32.60 kg/m  - on re-ceheck 160/90 mmHg General appearance: alert, cooperative,  appears stated age, no distress. Mildly obese HEENT: [place]/AT, EOMI, MMM, anicteric sclera Neck: no adenopathy, no carotid bruit and no JVD Lungs: clear to auscultation bilaterally, normal percussion bilaterally and non-labored Heart: Irregularly irregular rate and rhythm - but controlled rate, S1 &S2 normal; no click, rub or gallop; 1/6 SEM at RUSB. Nondisplaced PMI. Abdomen: soft, non-tender; bowel sounds normal; no masses,  no organomegaly; no HJR Extremities: extremities normal, atraumatic, no cyanosis; 1+ edema  Pulses: 2+ and symmetric;  Skin: mobility and turgor normal and no evidence of bleeding or bruising  Neurologic: Mental status: Alert & oriented x 3, thought content appropriate; non-focal exam.  Pleasant mood & affect.    Adult ECG Report  Rate: 72 ;  Rhythm: atrial fibrillation and Otherwise normal axis, intervals and durations;   Narrative Interpretation: Stable EKG   Other studies Reviewed: Additional studies/ records that  were reviewed today include:  Recent Labs:  From Care Everywhere April 2018  Sodium 143, potassium 3.8, chloride 100, bicarbonate 36, BUN 15, creatinine 1.05 calcium 9.3. Normal LFTs.  LDL 61, triglycerides 83, total cholesterol 115, HDL 40  ASSESSMENT / PLAN: Problem List Items Addressed This Visit    Dyslipidemia - on statin; monitored by PCP (Chronic)    Lipids are being monitored by PCP. He is on low-dose atorvastatin. No myalgias.      Relevant Orders   EKG 12-Lead   Essential hypertension (Chronic)    Poorly controlled today. He had not taken his medicines yet so it may be inaccurate. On my recheck was slightly improved at 160/90 mmHg. Currently on Diovan HCTZ plus diltiazem.  I will) to monitor his blood pressure over the next 3-4 weeks and he will follow up with [facility] ([facility] Anticipate that potentially titrate up diltiazem dose and potentially add an additional anti-hypertensive agent      Relevant  Orders   EKG 12-Lead   Longstanding persistent atrial fibrillation ([facility]) - Primary (Chronic)    Pretty much asymptomatic unless his heart rate would naturally be up with elevation. Rate is relatively well-controlled diltiazem 240 mg daily. If blood pressure continues to be elevated, probably consider increasing dose to 360 mg daily.  Long-standing history of persistent A. fib. Per patient request after long discussion, he is on aspirin plus Plavix.      Relevant Orders   EKG 12-Lead   Obesity (BMI 30-39.9) (Chronic)    The patient understands the need to lose weight with diet and exercise. We have discussed specific strategies for this.      Venous insufficiency of lower extremity (Chronic)    Well-controlled with compression stockings and elevation. Not requiring diuretic.         Current medicines are reviewed at length with the patient today. (+/- concerns) n/a The following changes have been made: n/a  Patient Instructions  Medication Instructions: No changes   Follow-Up: Please follow up with [facility] in 3-4 weeks for a blood pressure check and possible medication change  Your physician wants you to follow-up in: 12 months with Dr. Ellyn Hack. You will receive a reminder letter in the mail two months in advance. If you don't receive a letter, please call our office to schedule the follow-up appointment.   Any Additional Special Instructions Will Be Listed Below (If Applicable).  Please check your blood pressure at home in the morning or afternoon. Keep a log of the readings and bring them to your next appointment.     If you need a refill on your cardiac medications before your next appointment, please call your pharmacy.     Studies Ordered:   Orders Placed This Encounter  Procedures  . EKG 12-Lead      Glenetta Hew, M.D., M.S. Interventional Cardiologist   Pager # [phone] Phone # [phone] [address] [place], [place] [address]

## 2017-03-20 NOTE — Assessment & Plan Note (Addendum)
Pretty much asymptomatic unless his heart rate would naturally be up with elevation. Rate is relatively well-controlled diltiazem 240 mg daily. If blood pressure continues to be elevated, probably consider increasing dose to 360 mg daily.  Long-standing history of persistent A. fib. Per patient request after long discussion, he is on aspirin plus Plavix.

## 2017-03-22 ENCOUNTER — Encounter: Payer: Self-pay | Admitting: Cardiology

## 2017-03-22 NOTE — Assessment & Plan Note (Signed)
Poorly controlled today. He had not taken his medicines yet so it may be inaccurate. On my recheck was slightly improved at 160/90 mmHg. Currently on Diovan HCTZ plus diltiazem.  I will) to monitor his blood pressure over the next 3-4 weeks and he will follow up with [facility] ([facility] Anticipate that potentially titrate up diltiazem dose and potentially add an additional anti-hypertensive agent

## 2017-03-22 NOTE — Assessment & Plan Note (Signed)
The patient understands the need to lose weight with diet and exercise. We have discussed specific strategies for this.  

## 2017-03-22 NOTE — Assessment & Plan Note (Signed)
Lipids are being monitored by PCP. He is on low-dose atorvastatin. No myalgias.

## 2017-03-22 NOTE — Assessment & Plan Note (Signed)
Well-controlled with compression stockings and elevation. Not requiring diuretic.

## 2017-04-17 ENCOUNTER — Ambulatory Visit (INDEPENDENT_AMBULATORY_CARE_PROVIDER_SITE_OTHER): Payer: Medicare Other | Admitting: Pharmacist

## 2017-04-17 VITALS — BP 176/102 | HR 70 | Wt 239.4 lb

## 2017-04-17 DIAGNOSIS — I10 Essential (primary) hypertension: Secondary | ICD-10-CM | POA: Diagnosis not present

## 2017-04-17 MED ORDER — IRBESARTAN-HYDROCHLOROTHIAZIDE 300-12.5 MG PO TABS: 1.0000 | ORAL_TABLET | Freq: Every day | ORAL | 5 refills | Status: DC

## 2017-04-17 NOTE — Assessment & Plan Note (Addendum)
Blood pressure today remains significantly above goal of 130/80. Home BP average of 21 readings is 178/112 and consistent with BP readings in clinic. During medication reconciliation is was noted Alex Alvarez is NOT taking Diovan/HCTZ as expected. Patient has no recollection about reason to stop the medication or day of last dose taken. Will obtain recent BMET results from PCP as baseline, start irbesartan 300/12.5mg  daily, and follow up in 4 weeks. Plan to repeat BMET during next office visit to assess renal stability. Patient also instructed to monitor BP twice daily and bring records to next office visit.

## 2017-04-17 NOTE — Progress Notes (Signed)
Patient ID: Alex Alvarez                 DOB: 12/21/1945                      MRN: [id number]     HPI: Alex Alvarez is a 71 y.o. male referred by Alex. Ellyn Alvarez to HTN clinic. PMH includes hypertension, atrial fibrillation, venous insufficieny and dyslipidemia. No changes in hypertension medication were made during most recent cardiologist follow up but "  If blood pressure continues to be elevated, probably consider increasing dose to 360 mg daily." per Alex Alvarez office note on 03/20/2017.  Patient presents to hypertension clinic for initial assessment. Patient denies dizziness, chest pain, increase fatigue or headaches. He is worried about elevated BP readings but not experiencing any symptoms at this time.   Current HTN meds:  Diltiazem 240mg  daily Valsartan/HCTZ 320/12.5mg  daily  BP goal: 130/80  Family History: cancer in maternal grandfather; cancer in his mother  Social History: former smoker (quit 10/2011), denies use smokeless tobacco, denies alcohol intake  Diet: avoid salt ; mainly home cooked meals, decaf coffee  Exercise: sedentary  Home BP readings: 21 readings; average 178/112  Wt Readings from Last 3 Encounters:  04/17/17 239 lb 6.4 oz (108.6 kg)  03/20/17 240 lb 6.4 oz (109 kg)  03/20/16 238 lb 9.6 oz (108.2 kg)   BP Readings from Last 3 Encounters:  04/17/17 (!) 176/102  03/20/17 (!) 176/112  03/20/16 (!) 150/98   Pulse Readings from Last 3 Encounters:  04/17/17 70  03/20/17 72  03/20/16 76    Past Medical History:  Diagnosis Date  . 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 Chronic atrial fibrillation ([facility]) 8/132014 office note   chronic afib dates back to June 2012; CHADS2-VASc score 2; candidate for dual  antiplatelet therapy based on active C and active S STUDIES  for thromboembolic prophyaxis--actually ot on coumadin but on aspirin ad plavix  . Dysrhythmia   . Former light tobacco smoker     smoked a  pipe; quit 25+ years ago   . Heart murmur   . Hypertension   . Obesity (BMI 30.0-34.9)     Current Outpatient Prescriptions on File Prior to Visit  Medication Sig Dispense Refill  . aspirin EC 81 MG tablet Take 2 tablets daily.    [name] atorvastatin (LIPITOR) 10 MG tablet Take 1 tablet by mouth  daily 90 tablet 3  . clopidogrel (PLAVIX) 75 MG tablet Take 1 tablet by mouth  daily with breakfast 90 tablet 3  . diclofenac (VOLTAREN) 75 MG EC tablet Take 75 mg by mouth daily.    [name] diltiazem (TIAZAC) 240 MG 24 hr capsule Take 1 capsule (240 mg total) by mouth daily. Please schedule appointment for refills. 90 capsule 0  . Multiple Vitamins-Minerals (MULTIVITAMIN WITH MINERALS) tablet Take 1 tablet by mouth daily.    [name] zolpidem (AMBIEN) 10 MG tablet Take 10 mg by mouth at bedtime as needed for sleep.     No current facility-administered medications on file prior to visit.     Allergies  Allergen Reactions  . Lipitor [Atorvastatin] Other (See Comments)    Leg cramps  . Lamisil [Terbinafine Hcl] Rash    Blood pressure (!) 176/102, pulse 70, weight 239 lb 6.4 oz (108.6 kg), SpO2 96 %.  Essential hypertension Blood pressure today remains significantly above goal of 130/80.  Home BP average of 21 readings is 178/112 and consistent with BP readings in clinic. During medication reconciliation is was noted Alex Alvarez is NOT taking Diovan/HCTZ as expected. Patient has no recollection about reason to stop the medication or day of last dose taken. Will obtain recent BMET results from PCP as baseline, start irbesartan 300/12.5mg  daily, and follow up in 4 weeks. Plan to repeat BMET during next office visit to assess renal stability. Patient also instructed to monitor BP twice daily and bring records to next office visit.    Alex Alvarez PharmD, [place] [id number] 04/17/2017 10:17 AM

## 2017-04-17 NOTE — Patient Instructions (Addendum)
Return for a  follow up appointment in 1 months  Your blood pressure today is 176/112 pulse 70  Check your blood pressure at home daily (if able) and keep record of the readings.  Take your BP meds as follows: *START taking irbesartan/HCTZ 300mg /12.5mg  daily* Continue all other mediaction as prescribed  Bring all of your meds, your BP cuff and your record of home blood pressures to your next appointment.  Exercise as you're able, try to walk approximately 30 minutes per day.  Keep salt intake to a minimum, especially watch canned and prepared boxed foods.  Eat more fresh fruits and vegetables and fewer canned items.  Avoid eating in fast food restaurants.    HOW TO TAKE YOUR BLOOD PRESSURE: . Rest 5 minutes before taking your blood pressure. .  Don't smoke or drink caffeinated beverages for at least 30 minutes before. . Take your blood pressure before (not after) you eat. . Sit comfortably with your back supported and both feet on the floor (don't cross your legs). . Elevate your arm to heart level on a table or a desk. . Use the proper sized cuff. It should fit smoothly and snugly around your bare upper arm. There should be enough room to slip a fingertip under the cuff. The bottom edge of the cuff should be 1 inch above the crease of the elbow. . Ideally, take 3 measurements at one sitting and record the average.

## 2017-04-28 ENCOUNTER — Other Ambulatory Visit: Payer: Self-pay | Admitting: Cardiology

## 2017-04-28 NOTE — Telephone Encounter (Signed)
Rx request sent to pharmacy.  

## 2017-05-15 ENCOUNTER — Ambulatory Visit (INDEPENDENT_AMBULATORY_CARE_PROVIDER_SITE_OTHER): Payer: Medicare Other | Admitting: Pharmacist

## 2017-05-15 VITALS — BP 144/99 | HR 72

## 2017-05-15 DIAGNOSIS — I10 Essential (primary) hypertension: Secondary | ICD-10-CM

## 2017-05-15 MED ORDER — DILTIAZEM HCL ER BEADS 300 MG PO CP24: 300.0000 mg | ORAL_CAPSULE | Freq: Every day | ORAL | 1 refills | Status: DC

## 2017-05-15 NOTE — Progress Notes (Signed)
Patient ID: Alex Alvarez                 DOB: [birth year]                      MRN: [id number]     HPI: Alex Alvarez is a 71 y.o. male referred by Dr. Ellyn Hack to HTN clinic. PMH inlcudes hypertension, atrial fibrillation, venous insufficiency, and dyslipidemia. During most recent office visit was noted patient was NOT taking valsartan/HCTZ 320/12.5mg  as expected and was using BP monotherapy of diltiazem 240mg  daily. Valsartan was discontinued on 04/17/2017 and Irbesartan/HCTZ 300/12.5mg  daily was initiated.  I obtained copy of most recent BMET from PCP office as baseline and plan to repeat BMET today for re-assessment of renal function.  Patient presents to clinic for HTN follow up. Denied dizziness, HA or shotrness of breath. Reports some swelling in lower extremities but is not a new symptoms and remains unchanged. He has some bathroom urgency with HCTZ usage but not significant problems to report.  Current HTN meds:  Diltiazem 240mg  daily Irbesartan/HCTZ 300mg -12.5mg  daily   BP goal: 130/80  Family History: cancer in maternal grandfather; cancer in his mother  Social History: former smoker (quit 10/2011), denies use smokeless tobacco, denies alcohol intake  Diet: avoid salt ; mainly home cooked meals, decaf coffee  Exercise: sedentary  Home BP readings: 19 readings; average 161/102 (pulse 55 - 85 bpm)  Wt Readings from Last 3 Encounters:  04/17/17 239 lb 6.4 oz (108.6 kg)  03/20/17 240 lb 6.4 oz (109 kg)  03/20/16 238 lb 9.6 oz (108.2 kg)   BP Readings from Last 3 Encounters:  05/15/17 (!) 144/99  04/17/17 (!) 176/102  03/20/17 (!) 176/112   Pulse Readings from Last 3 Encounters:  05/15/17 72  04/17/17 70  03/20/17 72    Past Medical History:  Diagnosis Date  . 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 Chronic atrial fibrillation ([facility]) 8/132014 office note   chronic afib dates back to June 2012; CHADS2-VASc score 2;  candidate for dual  antiplatelet therapy based on active C and active S STUDIES  for thromboembolic prophyaxis--actually ot on coumadin but on aspirin ad plavix  . Dysrhythmia   . Former light tobacco smoker     smoked a pipe; quit 25+ years ago   . Heart murmur   . Hypertension   . Obesity (BMI 30.0-34.9)     Current Outpatient Prescriptions on File Prior to Visit  Medication Sig Dispense Refill  . aspirin EC 81 MG tablet Take 2 tablets daily.    [name] atorvastatin (LIPITOR) 10 MG tablet Take 1 tablet by mouth  daily 90 tablet 3  . clopidogrel (PLAVIX) 75 MG tablet TAKE 1 TABLET BY MOUTH  DAILY WITH BREAKFAST 90 tablet 1  . diclofenac (VOLTAREN) 75 MG EC tablet Take 75 mg by mouth daily.    . irbesartan-hydrochlorothiazide (AVALIDE) 300-12.5 MG tablet Take 1 tablet by mouth daily. 30 tablet 5  . Multiple Vitamins-Minerals (MULTIVITAMIN WITH MINERALS) tablet Take 1 tablet by mouth daily.    [name] terbinafine (LAMISIL) 250 MG tablet Take 250 mg by mouth every 3 (three) days.    [name] zolpidem (AMBIEN) 10 MG tablet Take 10 mg by mouth at bedtime as needed for sleep.     No current facility-administered medications on file prior to visit.     Allergies  Allergen Reactions  . Lipitor [  Atorvastatin] Other (See Comments)    Leg cramps  . Lamisil [Terbinafine Hcl] Rash    Blood pressure (!) 144/99, pulse 72.  Essential hypertension  Blood pressure remains significantly above desire goal of 130/80 but improved since last office visit. Home BP average decreased from 178/112 to 161/102.  Will increase diltiazem 240mg  XL to diltiazem 300mg  XL daily. Will repeat BMET today to re-assess renal function after initiation of Irbesartan/HCTZ 300mg -12.5mg  daily and follow up at HTN clinic in 4 weeks.  Plan to add spironolactone to current therapy if BMET remains stable and potassium level adequate.   Alex Alvarez PharmD, BCPS, [place]  [id number] 05/15/2017 8:34 PM

## 2017-05-15 NOTE — Assessment & Plan Note (Signed)
Blood pressure remains significantly above desire goal of 130/80 but improved since last office visit. Home BP average decreased from 178/112 to 161/102.  Will increase diltiazem 240mg  XL to diltiazem 300mg  XL daily. Will repeat BMET today to re-assess renal function after initiation of Irbesartan/HCTZ 300mg -12.5mg  daily and follow up at HTN clinic in 4 weeks.  Plan to add spironolactone to current therapy if BMET remains stable and potassium level adequate.

## 2017-05-15 NOTE — Patient Instructions (Addendum)
Return for a a follow up appointment in 4 weeks  Your blood pressure today is 144/99 pulse 72  Check your blood pressure at home daily (if able) and keep record of the readings.  Take your BP meds as follows: *Increase diltiazem to 300mg  daily - okay to finish 240mg  capsule before change* ALL other medication as scheduled  Repeat Blood work today  Bring all of your meds, your BP cuff and your record of home blood pressures to your next appointment.  Exercise as you're able, try to walk approximately 30 minutes per day.  Keep salt intake to a minimum, especially watch canned and prepared boxed foods.  Eat more fresh fruits and vegetables and fewer canned items.  Avoid eating in fast food restaurants.    HOW TO TAKE YOUR BLOOD PRESSURE: . Rest 5 minutes before taking your blood pressure. .  Don't smoke or drink caffeinated beverages for at least 30 minutes before. . Take your blood pressure before (not after) you eat. . Sit comfortably with your back supported and both feet on the floor (don't cross your legs). . Elevate your arm to heart level on a table or a desk. . Use the proper sized cuff. It should fit smoothly and snugly around your bare upper arm. There should be enough room to slip a fingertip under the cuff. The bottom edge of the cuff should be 1 inch above the crease of the elbow. . Ideally, take 3 measurements at one sitting and record the average.

## 2017-05-16 LAB — BASIC METABOLIC PANEL
BUN / CREAT RATIO: 14 (ref 10–24)
BUN: 16 mg/dL (ref 8–27)
CHLORIDE: 99 mmol/L (ref 96–106)
CO2: 30 mmol/L — ABNORMAL HIGH (ref 20–29)
Calcium: 8.7 mg/dL (ref 8.6–10.2)
Creatinine, Ser: 1.15 mg/dL (ref 0.76–1.27)
GFR, EST AFRICAN AMERICAN: 73 mL/min/{1.73_m2} (ref >59)
GFR, EST NON AFRICAN AMERICAN: 63 mL/min/{1.73_m2} (ref >59)
Glucose: 115 mg/dL — ABNORMAL HIGH (ref 65–99)
Potassium: 4 mmol/L (ref 3.5–5.2)
Sodium: 142 mmol/L (ref 134–144)

## 2017-05-23 ENCOUNTER — Telehealth: Payer: Self-pay | Admitting: Pharmacist

## 2017-05-23 NOTE — Telephone Encounter (Signed)
LMOM; renal function stable . No changes in medication at this time. Next F/u in 3 weeks

## 2017-06-11 NOTE — Progress Notes (Signed)
Patient ID: Alex Alvarez                 DOB: 1946-05-15                      MRN: [id number]     HPI: Alex Alvarez is a 71 y.o. male referred by Dr. Ellyn Hack to HTN clinic. PMH inlcudes hypertension, atrial fibrillation, venous insufficiency, and dyslipidemia. Valsartan was discontinued on 04/17/2017 and Irbesartan/HCTZ 300/12.5mg  daily was initiated. During most recent OV his diltiazem dose was increased from 240mg  daily to 300mg  daily.  Patient presents to clinic for HTN follow up. Denies headaches , dizziness, chest pain, increased fatigue or swelling.  Current HTN meds:  Diltiazem 300mg  daily Irbesartan/HCTZ 300mg -12.5mg  daily   BP goal: 130/80  Family History: cancer in maternal grandfather; cancer in his mother  Social History: former smoker (quit 10/2011), denies use smokeless tobacco, denies alcohol intake  Diet: avoid salt ; mainly home cooked meals, decaf coffee  Exercise: sedentary  Home BP readings:  19 readings: average 158/97 (pulse 57-79 bpm)  Wt Readings from Last 3 Encounters:  06/12/17 240 lb 12.8 oz (109.2 kg)  04/17/17 239 lb 6.4 oz (108.6 kg)  03/20/17 240 lb 6.4 oz (109 kg)   BP Readings from Last 3 Encounters:  06/12/17 (!) 144/92  05/15/17 (!) 144/99  04/17/17 (!) 176/102   Pulse Readings from Last 3 Encounters:  06/12/17 70  05/15/17 72  04/17/17 70    Past Medical History:  Diagnosis Date  . 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 Chronic atrial fibrillation ([facility]) 8/132014 office note   chronic afib dates back to June 2012; CHADS2-VASc score 2; candidate for dual  antiplatelet therapy based on active C and active S STUDIES  for thromboembolic prophyaxis--actually ot on coumadin but on aspirin ad plavix  . Dysrhythmia   . Former light tobacco smoker     smoked a pipe; quit 25+ years ago   . Heart murmur   . Hypertension   . Obesity (BMI 30.0-34.9)     Current Outpatient Prescriptions on File  Prior to Visit  Medication Sig Dispense Refill  . aspirin EC 81 MG tablet Take 2 tablets daily.    [name] atorvastatin (LIPITOR) 10 MG tablet Take 1 tablet by mouth  daily 90 tablet 3  . clopidogrel (PLAVIX) 75 MG tablet TAKE 1 TABLET BY MOUTH  DAILY WITH BREAKFAST 90 tablet 1  . diclofenac (VOLTAREN) 75 MG EC tablet Take 75 mg by mouth daily.    [name] diltiazem (TIAZAC) 300 MG 24 hr capsule Take 1 capsule (300 mg total) by mouth daily. 30 capsule 1  . irbesartan-hydrochlorothiazide (AVALIDE) 300-12.5 MG tablet Take 1 tablet by mouth daily. 30 tablet 5  . Multiple Vitamins-Minerals (MULTIVITAMIN WITH MINERALS) tablet Take 1 tablet by mouth daily.    [name] terbinafine (LAMISIL) 250 MG tablet Take 250 mg by mouth every 3 (three) days.    [name] zolpidem (AMBIEN) 10 MG tablet Take 10 mg by mouth at bedtime as needed for sleep.     No current facility-administered medications on file prior to visit.     Allergies  Allergen Reactions  . Lipitor [Atorvastatin] Other (See Comments)    Leg cramps  . Lamisil [Terbinafine Hcl] Rash    Blood pressure (!) 144/92, pulse 70, weight 240 lb 12.8 oz (109.2 kg), SpO2 95 %.  Essential hypertension Blood pressure  remains unchanged since diltiazem dose was increased to 300mg  daily 4 weeks ago. Patient denies problems with current therapy. Also denies dizziness, headaches or increase fatigue. Will add spironolactone 12.5mg  daily to current therapy and repeat BEMT in 1 week. Patient to continue twice daily BP monitoring and bring readings to next f/u appointment in 4 weeks.   Byrdie Miyazaki Rodriguez-Guzman PharmD, BCPS, [place] [address] [place] [address] 06/12/2017 8:16 PM

## 2017-06-12 ENCOUNTER — Ambulatory Visit (INDEPENDENT_AMBULATORY_CARE_PROVIDER_SITE_OTHER): Payer: Medicare Other | Admitting: Pharmacist

## 2017-06-12 VITALS — BP 144/92 | HR 70 | Wt 240.8 lb

## 2017-06-12 DIAGNOSIS — I10 Essential (primary) hypertension: Secondary | ICD-10-CM | POA: Diagnosis not present

## 2017-06-12 MED ORDER — SPIRONOLACTONE 25 MG PO TABS: 12.5000 mg | ORAL_TABLET | Freq: Every day | ORAL | 0 refills | Status: DC

## 2017-06-12 NOTE — Patient Instructions (Addendum)
Return for a follow up appointment in 1 week for blood work, 3 weeks for blood pressure check  Your blood pressure today is 144/92 pulse 70  Check your blood pressure at home daily (if able) and keep record of the readings.  Take your BP meds as follows: *START spironolactone 12.5mg every morning* Continue all other medication as prescribed  Bring all of your meds, your BP cuff and your record of home blood pressures to your next appointment.  Exercise as you're able, try to walk approximately 30 minutes per day.  Keep salt intake to a minimum, especially watch canned and prepared boxed foods.  Eat more fresh fruits and vegetables and fewer canned items.  Avoid eating in fast food restaurants.    HOW TO TAKE YOUR BLOOD PRESSURE: . Rest 5 minutes before taking your blood pressure. .  Don't smoke or drink caffeinated beverages for at least 30 minutes before. . Take your blood pressure before (not after) you eat. . Sit comfortably with your back supported and both feet on the floor (don't cross your legs). . Elevate your arm to heart level on a table or a desk. . Use the proper sized cuff. It should fit smoothly and snugly around your bare upper arm. There should be enough room to slip a fingertip under the cuff. The bottom edge of the cuff should be 1 inch above the crease of the elbow. . Ideally, take 3 measurements at one sitting and record the average.

## 2017-06-12 NOTE — Assessment & Plan Note (Signed)
Blood pressure remains unchanged since diltiazem dose was increased to 300mg  daily 4 weeks ago. Patient denies problems with current therapy. Also denies dizziness, headaches or increase fatigue. Will add spironolactone 12.5mg  daily to current therapy and repeat BEMT in 1 week. Patient to continue twice daily BP monitoring and bring readings to next f/u appointment in 4 weeks.

## 2017-06-25 LAB — BASIC METABOLIC PANEL
BUN / CREAT RATIO: 18 (ref 10–24)
BUN: 22 mg/dL (ref 8–27)
CHLORIDE: 98 mmol/L (ref 96–106)
CO2: 28 mmol/L (ref 20–29)
CREATININE: 1.23 mg/dL (ref 0.76–1.27)
Calcium: 9.6 mg/dL (ref 8.6–10.2)
GFR calc Af Amer: 67 mL/min/{1.73_m2} (ref >59)
GFR calc non Af Amer: 58 mL/min/{1.73_m2} — ABNORMAL LOW (ref >59)
GLUCOSE: 113 mg/dL — AB (ref 65–99)
Potassium: 4.5 mmol/L (ref 3.5–5.2)
SODIUM: 141 mmol/L (ref 134–144)

## 2017-07-02 NOTE — Progress Notes (Signed)
Patient ID: ALMOND FITZGIBBON                 DOB: [birth year]                      MRN: [id number]     HPI: Alex Alvarez is a 71 y.o. male referred by Dr. Ellyn Hack to HTN clinic. PMH inlcudes hypertension, atrial fibrillation, venous insufficiency, and dyslipidemia. Valsartan was discontinued on 04/17/2017 and Irbesartan/HCTZ 300/12.5mg  daily was initiated. During most recent OV Spironolactone 12.5mg  daily was added to therapy.  Patient presents to clinic for HTN follow up. BMET repeat done 1 week after initiating spironolactone showed stable electrolytes and renal function. Denies headaches , dizziness, chest pain, increased fatigue or swelling.   Current HTN meds:  Diltiazem 300mg  daily Irbesartan/HCTZ 300mg -12.5mg  daily  Spironolactone 12.5mg  daily  BP goal: 130/80  Family History: cancer in maternal grandfather; cancer in his mother  Social History: former smoker (quit 10/2011), denies use smokeless tobacco, denies alcohol intake  Diet: avoid salt ; mainly home cooked meals, decaf coffee  Exercise: sedentary  Home BP readings:  6 readings: average 149/88 (pulse 65-78 bpm)  Wt Readings from Last 3 Encounters:  06/12/17 240 lb 12.8 oz (109.2 kg)  04/17/17 239 lb 6.4 oz (108.6 kg)  03/20/17 240 lb 6.4 oz (109 kg)   BP Readings from Last 3 Encounters:  07/03/17 138/88  06/12/17 (!) 144/92  05/15/17 (!) 144/99   Pulse Readings from Last 3 Encounters:  07/03/17 82  06/12/17 70  05/15/17 72    Past Medical History:  Diagnosis Date  . 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 Chronic atrial fibrillation ([facility]) 8/132014 office note   chronic afib dates back to June 2012; CHADS2-VASc score 2; candidate for dual  antiplatelet therapy based on active C and active S STUDIES  for thromboembolic prophyaxis--actually ot on coumadin but on aspirin ad plavix  . Dysrhythmia   . Former light tobacco smoker     smoked a pipe; quit 25+ years ago   .  Heart murmur   . Hypertension   . Obesity (BMI 30.0-34.9)     Current Outpatient Prescriptions on File Prior to Visit  Medication Sig Dispense Refill  . aspirin EC 81 MG tablet Take 2 tablets daily.    [name] atorvastatin (LIPITOR) 10 MG tablet Take 1 tablet by mouth  daily 90 tablet 3  . clopidogrel (PLAVIX) 75 MG tablet TAKE 1 TABLET BY MOUTH  DAILY WITH BREAKFAST 90 tablet 1  . diclofenac (VOLTAREN) 75 MG EC tablet Take 75 mg by mouth daily.    [name] diltiazem (TIAZAC) 300 MG 24 hr capsule Take 1 capsule (300 mg total) by mouth daily. 30 capsule 1  . irbesartan-hydrochlorothiazide (AVALIDE) 300-12.5 MG tablet Take 1 tablet by mouth daily. 30 tablet 5  . Multiple Vitamins-Minerals (MULTIVITAMIN WITH MINERALS) tablet Take 1 tablet by mouth daily.    [name] spironolactone (ALDACTONE) 25 MG tablet Take 0.5 tablets (12.5 mg total) by mouth daily. 15 tablet 0  . terbinafine (LAMISIL) 250 MG tablet Take 250 mg by mouth every 3 (three) days.    [name] zolpidem (AMBIEN) 10 MG tablet Take 10 mg by mouth at bedtime as needed for sleep.     No current facility-administered medications on file prior to visit.     Allergies  Allergen Reactions  . Lipitor [Atorvastatin] Other (See Comments)  Leg cramps  . Lamisil [Terbinafine Hcl] Rash    Blood pressure 138/88, pulse 82, SpO2 99 %.  Essential hypertension Blood pressure  better controlled today at 138/88 with an average home BP reading of 149/88. Patient was allow to sit quietly in the office for 5 minutes before measuring BP. He reports none of his home readings are done in the morning nor after resting quietly.  Denies problems with current therapy or ADR. Also noted, BP reading of 124/74 at PCP office yesterday. Will continue all medication as previously prescribed. No changes in therapy recommended today. Encouraged low sodium and low fat diet. He is to continue weekly BP monitoring and call HTN clinic as needed.   Janessa Mickle Rodriguez-Guzman PharmD, BCPS,  [place] [id number] 07/03/2017 9:56 AM

## 2017-07-03 ENCOUNTER — Ambulatory Visit (INDEPENDENT_AMBULATORY_CARE_PROVIDER_SITE_OTHER): Payer: Medicare Other | Admitting: Pharmacist

## 2017-07-03 ENCOUNTER — Other Ambulatory Visit: Payer: Self-pay | Admitting: Pharmacist

## 2017-07-03 VITALS — BP 138/88 | HR 82

## 2017-07-03 DIAGNOSIS — I10 Essential (primary) hypertension: Secondary | ICD-10-CM

## 2017-07-03 MED ORDER — SPIRONOLACTONE 25 MG PO TABS: 12.5000 mg | ORAL_TABLET | Freq: Every day | ORAL | 1 refills | Status: DC

## 2017-07-03 NOTE — Patient Instructions (Signed)
Return for a follow up appointment in as needed  Your blood pressure today is 138/88 pulse 82  Check your blood pressure at home daily (if able) and keep record of the readings.  Take your BP meds as follows: *Continue all medication as previously prescribed*  Bring all of your meds, your BP cuff and your record of home blood pressures to your next appointment.  Exercise as you're able, try to walk approximately 30 minutes per day.  Keep salt intake to a minimum, especially watch canned and prepared boxed foods.  Eat more fresh fruits and vegetables and fewer canned items.  Avoid eating in fast food restaurants.    HOW TO TAKE YOUR BLOOD PRESSURE: . Rest 5 minutes before taking your blood pressure. .  Don't smoke or drink caffeinated beverages for at least 30 minutes before. . Take your blood pressure before (not after) you eat. . Sit comfortably with your back supported and both feet on the floor (don't cross your legs). . Elevate your arm to heart level on a table or a desk. . Use the proper sized cuff. It should fit smoothly and snugly around your bare upper arm. There should be enough room to slip a fingertip under the cuff. The bottom edge of the cuff should be 1 inch above the crease of the elbow. . Ideally, take 3 measurements at one sitting and record the average.

## 2017-07-03 NOTE — Assessment & Plan Note (Signed)
Blood pressure  better controlled today at 138/88 with an average home BP reading of 149/88. Patient was allow to sit quietly in the office for 5 minutes before measuring BP. He reports none of his home readings are done in the morning nor after resting quietly.  Denies problems with current therapy or ADR. Also noted, BP reading of 124/74 at PCP office yesterday. Will continue all medication as previously prescribed. No changes in therapy recommended today. Encouraged low sodium and low fat diet. He is to continue weekly BP monitoring and call HTN clinic as needed.

## 2017-07-04 ENCOUNTER — Other Ambulatory Visit: Payer: Self-pay | Admitting: *Deleted

## 2017-07-04 MED ORDER — SPIRONOLACTONE 25 MG PO TABS: 12.5000 mg | ORAL_TABLET | Freq: Every day | ORAL | 3 refills | Status: DC

## 2017-07-10 ENCOUNTER — Telehealth: Payer: Self-pay | Admitting: Cardiovascular Disease

## 2017-07-10 NOTE — Telephone Encounter (Signed)
Returned call to [facility]-wanted to verify that patient is suppose to be taking irbesartan/hctz and spironolactone.   Advised per last OV note-patient is taking both.     HTN OV 11/1: HPI: Alex Alvarez is a 71 y.o. male referred by Dr. Ellyn Hack to HTN clinic. PMH inlcudes hypertension, atrial fibrillation, venous insufficiency, and dyslipidemia. Valsartan was discontinued on 04/17/2017 and Irbesartan/HCTZ 300/12.5mg  daily was initiated. During most recent OV Spironolactone 12.5mg  daily was added to therapy.  Patient presents to clinic for HTN follow up. BMET repeat done 1 week after initiating spironolactone showed stable electrolytes and renal function. Denies headaches , dizziness, chest pain, increased fatigue or swelling.    Verbalized understanding.

## 2017-07-10 NOTE — Telephone Encounter (Signed)
Alex Alvarez from [facility] calling, states that she needs a medical clarification in regards to a possible drug interaction.   Reference # [id number]

## 2017-09-08 ENCOUNTER — Other Ambulatory Visit: Payer: Self-pay

## 2017-09-08 MED ORDER — IRBESARTAN-HYDROCHLOROTHIAZIDE 300-12.5 MG PO TABS: 1.0000 | ORAL_TABLET | Freq: Every day | ORAL | 1 refills | Status: DC

## 2017-09-08 NOTE — Telephone Encounter (Signed)
Rx(s) sent to pharmacy electronically.  

## 2017-11-08 ENCOUNTER — Other Ambulatory Visit: Payer: Self-pay | Admitting: Cardiology

## 2017-11-08 ENCOUNTER — Other Ambulatory Visit: Payer: Self-pay | Admitting: Cardiovascular Disease

## 2017-12-31 DIAGNOSIS — J302 Other seasonal allergic rhinitis: Secondary | ICD-10-CM | POA: Insufficient documentation

## 2018-03-02 ENCOUNTER — Encounter: Payer: Self-pay | Admitting: Podiatry

## 2018-03-02 ENCOUNTER — Other Ambulatory Visit: Payer: Self-pay | Admitting: Podiatry

## 2018-03-02 ENCOUNTER — Ambulatory Visit: Payer: Medicare Other | Admitting: Podiatry

## 2018-03-02 DIAGNOSIS — I83015 Varicose veins of right lower extremity with ulcer other part of foot: Secondary | ICD-10-CM | POA: Diagnosis not present

## 2018-03-02 DIAGNOSIS — L97511 Non-pressure chronic ulcer of other part of right foot limited to breakdown of skin: Secondary | ICD-10-CM | POA: Insufficient documentation

## 2018-03-02 DIAGNOSIS — L97519 Non-pressure chronic ulcer of other part of right foot with unspecified severity: Secondary | ICD-10-CM | POA: Diagnosis not present

## 2018-03-02 DIAGNOSIS — L97512 Non-pressure chronic ulcer of other part of right foot with fat layer exposed: Secondary | ICD-10-CM

## 2018-03-02 MED ORDER — DOXYCYCLINE HYCLATE 100 MG PO TABS: 100.0000 mg | ORAL_TABLET | Freq: Two times a day (BID) | ORAL | 0 refills | Status: DC

## 2018-03-02 MED ORDER — GENTAMICIN SULFATE 0.1 % EX CREA: 1.0000 "application " | TOPICAL_CREAM | Freq: Three times a day (TID) | CUTANEOUS | 1 refills | Status: DC

## 2018-03-04 DIAGNOSIS — Z22322 Carrier or suspected carrier of Methicillin resistant Staphylococcus aureus: Secondary | ICD-10-CM | POA: Insufficient documentation

## 2018-03-06 LAB — WOUND CULTURE
MICRO NUMBER:: 90787446
SPECIMEN QUALITY: ADEQUATE

## 2018-03-08 NOTE — Progress Notes (Signed)
   Subjective:  72 year old male presenting today as a new patient with a chief complaint of an ulceration to the right third toe that appeared several weeks ago. He also reports nail fungus of the same toe. He reports taking Lamisil every other day for the past three years for treatment. There are no modifying factors noted for his complaints. He has not done anything to treat the wound. Patient is here for further evaluation and treatment.   Past Medical History:  Diagnosis Date  . 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 Chronic atrial fibrillation ([facility]) 8/132014 office note   chronic afib dates back to June 2012; CHADS2-VASc score 2; candidate for dual  antiplatelet therapy based on active C and active S STUDIES  for thromboembolic prophyaxis--actually ot on coumadin but on aspirin ad plavix  . Dysrhythmia   . Former light tobacco smoker     smoked a pipe; quit 25+ years ago   . Heart murmur   . Hypertension   . Obesity (BMI 30.0-34.9)      Objective/Physical Exam General: The patient is alert and oriented x3 in no acute distress.  Dermatology:  Wound #1 noted to the right third toe measuring 0.5 x 0.5 x 0.2 cm (LxWxD).   To the noted ulceration(s), there is no eschar. There is a moderate amount of slough, fibrin, and necrotic tissue noted. Granulation tissue and wound base is red. There is a minimal amount of serosanguineous drainage noted. There is no exposed bone muscle-tendon ligament or joint. There is no malodor. Periwound integrity is intact. Skin is warm, dry and supple bilateral lower extremities.  Vascular: Palpable pedal pulses bilaterally. Mild edema noted. Capillary refill within normal limits. Varicosities noted bilateral lower extremities.   Neurological: Epicritic and protective threshold diminished bilaterally.   Musculoskeletal Exam: Range of motion within normal limits to all pedal and ankle joints bilateral. Muscle  strength 5/5 in all groups bilateral.   Assessment: #1 ulceration of the right 3rd toe secondary to venous insufficiency #2 varicosities bilateral lower extremities  Plan of Care:  #1 Patient was evaluated. #2 medically necessary excisional debridement including subcutaneous tissue was performed using a tissue nipper and a chisel blade. Excisional debridement of all the necrotic nonviable tissue down to healthy bleeding viable tissue was performed with post-debridement measurements same as pre-. #3 the wound was cleansed with normal saline. Dry sterile dressing applied.  #4 Culture taken from wound.  #5 Prescription for Doxycycline 100 mg #20 provided to patient.  #6 Prescription for gentamicin cream provided to patient.  #7 Post op shoe dispensed.  #8 Return to clinic in 2 weeks.    Edrick Kins, DPM [facility]  Dr. Edrick Kins, [address]                                        [place], [place] [address]                Office [phone]  Fax [phone]

## 2018-03-16 ENCOUNTER — Other Ambulatory Visit: Payer: Self-pay | Admitting: Cardiology

## 2018-03-16 NOTE — Telephone Encounter (Signed)
Rx has been sent to the pharmacy electronically. ° °

## 2018-03-20 ENCOUNTER — Ambulatory Visit: Payer: Medicare Other | Admitting: Cardiology

## 2018-03-20 ENCOUNTER — Encounter: Payer: Self-pay | Admitting: Cardiology

## 2018-03-20 VITALS — BP 122/80 | HR 69 | Ht 72.0 in | Wt 245.6 lb

## 2018-03-20 DIAGNOSIS — I10 Essential (primary) hypertension: Secondary | ICD-10-CM

## 2018-03-20 DIAGNOSIS — E669 Obesity, unspecified: Secondary | ICD-10-CM

## 2018-03-20 DIAGNOSIS — I872 Venous insufficiency (chronic) (peripheral): Secondary | ICD-10-CM | POA: Diagnosis not present

## 2018-03-20 DIAGNOSIS — I482 Chronic atrial fibrillation, unspecified: Secondary | ICD-10-CM

## 2018-03-20 DIAGNOSIS — R600 Localized edema: Secondary | ICD-10-CM

## 2018-03-20 DIAGNOSIS — I481 Persistent atrial fibrillation: Secondary | ICD-10-CM

## 2018-03-20 DIAGNOSIS — E785 Hyperlipidemia, unspecified: Secondary | ICD-10-CM | POA: Diagnosis not present

## 2018-03-20 DIAGNOSIS — I4811 Longstanding persistent atrial fibrillation: Secondary | ICD-10-CM

## 2018-03-20 MED ORDER — APIXABAN 5 MG PO TABS: 5.0000 mg | ORAL_TABLET | Freq: Two times a day (BID) | ORAL | 3 refills | Status: DC

## 2018-03-20 NOTE — Patient Instructions (Signed)
Medication Instructions: Dr Ellyn Hack has recommended making the following medication changes: 1. STOP Aspirin 2. STOP Clopidogrel 3. START Eliqius 5 mg TWICE daily  Labwork: Discuss with your primary care doctor about checking your cholesterol  Testing/Procedures: NONE ORDERED  Follow-up: Dr Ellyn Hack recommends that you schedule a follow-up appointment in 12 months. You will receive a reminder letter in the mail two months in advance. If you don't receive a letter, please call our office to schedule the follow-up appointment.  If you need a refill on your cardiac medications before your next appointment, please call your pharmacy.

## 2018-03-20 NOTE — Progress Notes (Signed)
PCP: Christain Sacramento, MD  Clinic Note: Chief Complaint  Patient presents with  . Follow-up  . Atrial Fibrillation  . Hypertension  . Hyperlipidemia    HPI: Alex Alvarez is a 72 y.o. male with a PMH below who presents today for annual follow-up for hypertension and atrial fibrillation plus dyslipidemia.  He also has lower extremity edema from venous stasis.Alex Alvarez  He is a former patient of Dr. Rollene Fare whom I first saw in April of 2015.Alex Alvarez   After detailed discussions with Dr. Rollene Fare during his last couple visits, they decided to start him on aspirin plus Plavix based on a CHADS2-VASc score of 2 (until age 2-75 y/o).   He was converted from simvastatin to atorvastatin to avoid interaction with diltiazem.  KOBE OFALLON was last seen on March 20, 2017 --  He was doing very well.  No major complaints.  He only notes the A. fib is an irregular heartbeat if he is exerting himself and his heart rate goes up.  He sometimes will also noted that when he first sits down.  Recent Hospitalizations:   No ER or hospital visits  F/u with CVRR - started on Spironolactone to help BP.  Studies Personally Reviewed - (if available, images/films reviewed: From Epic Chart or Care Everywhere)  None  Interval History: [name] returns today again really feeling quite well.  He is trying to stay active, But is not able to do as much exercise as he used to do because he is having to be the caregiver for his wife.  He does lots of housework and yard work .  He also still works Energy manager.  He again only really notes being in A. fib when he is exerting himself and his heart rate goes fast.  Otherwise he has not had any rapid irregular beats at rest.  He denies any significant exertional dyspnea or chest discomfort.  He is a little out of shape weight will get short of breath if he tries to [facility] himself or walk too far. He still has his baseline lower extremity swelling but is doing  pretty well with foot elevation and wearing a support stockings.  He notes swelling more during thesummertime, partly because he is not wearing his support stockings as much.  Remainder of cardiac review of symptoms: No lightheadedness, dizziness, weakness or syncope/near syncope. No TIA/amaurosis fugax symptoms. No melena, hematochezia, hematuria, or epstaxis. No claudication.  ROS: A comprehensive was performed. Review of Systems  Constitutional: Negative for malaise/fatigue.  HENT: Negative for congestion.   Respiratory: Negative for shortness of breath.   Gastrointestinal: Negative for abdominal pain and heartburn.  Musculoskeletal: Negative for joint pain and myalgias.  Neurological: Negative for dizziness and focal weakness.  Psychiatric/Behavioral: The patient does not have insomnia.   All other systems reviewed and are negative.  I have reviewed and (if needed) personally updated the patient's problem list, medications, allergies, past medical and surgical history, social and family history.   Past Medical History:  Diagnosis Date  . 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 Chronic atrial fibrillation ([facility]) 8/132014 office note   chronic afib dates back to June 2012; CHADS2-VASc score 2; candidate for dual  antiplatelet therapy based on active C and active S STUDIES  for thromboembolic prophyaxis--actually ot on coumadin but on aspirin ad plavix  . Dysrhythmia   . Former light tobacco smoker  smoked a pipe; quit 25+ years ago   . Hypertension   . Obesity (BMI 30.0-34.9)   . Senile calcific aortic valve sclerosis 10/2012   No stenosis    Past Surgical History:  Procedure Laterality Date  . Aorta doppler  11/12/2012   normal abdomnal aortic duplex   . JOINT REPLACEMENT Left Jul 07, 2015  . NM MYOVIEW LTD  07/30/2011   non-gated study seconadry to a fib;exercise capcity 7 mets--mildly reduced;low risk scan  . SKIN BIOPSY N/A  sept 2016   Basal cell from face.  . TONSILLECTOMY    . TOTAL KNEE ARTHROPLASTY Left 07/07/2015   Procedure: LEFT TOTAL KNEE ARTHROPLASTY;  Surgeon: Netta Cedars, MD;  Location: [facility];  Service: Orthopedics;  Laterality: Left;  . TRANSTHORACIC ECHOCARDIOGRAM  02/22/2011   EF 50-55%; with mild inferior wall hypokinesis, mild left atrial enlargement of 4.2cm  . TRANSTHORACIC ECHOCARDIOGRAM  11/12/2012   afib; EF 50-55% (CRO WMA b/c Afib - difficult gaiting). AoV sclerosis without stenosis    Current Meds  Medication Sig  . atorvastatin (LIPITOR) 10 MG tablet Take 1 tablet by mouth  daily  . diclofenac (VOLTAREN) 75 MG EC tablet Take 75 mg by mouth daily.  Alex Alvarez diltiazem (CARDIZEM CD) 240 MG 24 hr capsule Take by mouth as directed.   . irbesartan-hydrochlorothiazide (AVALIDE) 300-12.5 MG tablet TAKE 1 TABLET BY MOUTH EVERY DAY  . methocarbamol (ROBAXIN) 500 MG tablet Take by mouth as directed.   . Multiple Vitamins-Minerals (MULTIVITAMIN WITH MINERALS) tablet Take 1 tablet by mouth daily.  Alex Alvarez SM MULTIPLE VITAMINS/IRON TABS Take by mouth as directed.   Alex Alvarez spironolactone (ALDACTONE) 25 MG tablet Take 0.5 tablets (12.5 mg total) by mouth daily.  . Suvorexant (BELSOMRA) 10 MG TABS TAKE 1 TABLET AT BEDTIME AS NEEDED SLEEP  . zolpidem (AMBIEN) 10 MG tablet Take 10 mg by mouth at bedtime as needed for sleep.  . [DISCONTINUED] aspirin EC 81 MG tablet Take 2 tablets daily.  . [DISCONTINUED] clopidogrel (PLAVIX) 75 MG tablet TAKE 1 TABLET BY MOUTH  DAILY WITH BREAKFAST    Allergies  Allergen Reactions  . Lipitor [Atorvastatin] Other (See Comments)    Leg cramps    Social History   Tobacco Use  . Smoking status: Former Smoker    Types: Pipe    Last attempt to quit: 10/24/2011    Years since quitting: 6.4  . Smokeless tobacco: Never Used  Substance Use Topics  . Alcohol use: No  . Drug use: No   Social History   Social History Narrative   He is a full-time Optometrist for [employer].   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   He is active and ambulatory, walking on a regular basis roughly 3-5 days a week 3 miles a time.   He used to smoke a pipe but quit 25 years ago.    family history includes Cancer in his maternal grandfather; Cancer - Ovarian in his mother.  Wt Readings from Last 3 Encounters:  03/20/18 245 lb 9.6 oz (111.4 kg)  06/12/17 240 lb 12.8 oz (109.2 kg)  04/17/17 239 lb 6.4 oz (108.6 kg)    PHYSICAL EXAM BP 122/80 Comment: 110/80 2ND READING LEFT ARM  Pulse 69   Ht 6' (1.829 m)   Wt 245 lb 9.6 oz (111.4 kg)   SpO2 95%   BMI 33.31 kg/m  Physical Exam  Constitutional: He is oriented to person, place, and time. He appears well-developed and  well-nourished. No distress.  HENT:  Head: Normocephalic and atraumatic.  Neck: Normal range of motion. Neck supple. No hepatojugular reflux and no JVD present. Carotid bruit is not present.  Cardiovascular: Normal rate, intact distal pulses and normal pulses. An irregularly irregular rhythm present. PMI is not displaced. Exam reveals no gallop and no friction rub.  Murmur heard.  Harsh crescendo-decrescendo early systolic murmur is present with a grade of 1/6 at the upper right sternal border. Pulmonary/Chest: Effort normal and breath sounds normal. No respiratory distress. He has no wheezes. He has no rales. He exhibits no tenderness.  Abdominal: Soft. Bowel sounds are normal. He exhibits no distension. There is no tenderness. There is no rebound.  No HSM  Musculoskeletal: Normal range of motion. He exhibits edema (Trivial mild edema with mild varicose veins.).  Neurological: He is alert and oriented to person, place, and time.  Skin:  No significant venous stasis changes.  Psychiatric: He has a normal mood and affect. His behavior is normal. Judgment and thought content normal.  Vitals reviewed.   Adult ECG Report  Rate: 69 ;  Rhythm: atrial fibrillation; normal axis, intervals &  durations.  Narrative Interpretation: stable.   Other studies Reviewed: Additional studies/ records that were reviewed today include:  Recent Labs:   No results found for: CHOL, HDL, LDLCALC, LDLDIRECT, TRIG, CHOLHDL   ASSESSMENT / PLAN: Problem List Items Addressed This Visit    Venous insufficiency of lower extremity (Chronic)    Well-controlled with compression stockings and foot elevation.  No current need for diuretic.      Relevant Medications   apixaban (ELIQUIS) 5 MG TABS tablet   Obesity (BMI 30-39.9) (Chronic)    The patient understands the need to lose weight with diet and exercise. We have discussed specific strategies for this.      Longstanding persistent atrial fibrillation (HCC) - Primary (Chronic)    He remains totally a symptomatically A. fib.  Rate controlled with diltiazem.  As discussed in previous visits, we went back through his risk profile.  Given his age now being 72 years old, I think is probably not a bad idea to consider converting him to [place] at this time.  We went through the CHA2DS2Vasc score it HAS BLED scores of stroke prevention versus bleeding risk, and basically Eliquis to be safer than aspirin plus Plavix.    Based on recent trend in reimbursement, I think Eliquis should be tolerable.  Depending on his insurance would either use Eliquis or Xarelto.      Relevant Medications   apixaban (ELIQUIS) 5 MG TABS tablet   Other Relevant Orders   EKG 12-Lead   Essential hypertension (Chronic)    Blood pressure looks much better today having added spironolactone.  Last saw Racquel Rodriguez-Guzman, [place] in CVRR in Nov 2018 --> BP OK at that time --> discussed how to follow with home BP readings.  More consistent with current level.      Relevant Medications   apixaban (ELIQUIS) 5 MG TABS tablet   Dyslipidemia - on statin; monitored by PCP (Chronic)    Should be due for lipid evaluation.  Is on statin.  I have not seen labs recently.      Bilateral  lower extremity edema (Chronic)    Probably related to venous stasis.  Controlled with support stockings (but not wearing during summertime months due to hot weather)       Other Visit Diagnoses    Chronic atrial fibrillation (HCC)  (Chronic)  Relevant Medications   apixaban (ELIQUIS) 5 MG TABS tablet     This patients CHA2DS2-VASc Score and unadjusted Ischemic Stroke Rate (% per year) is equal to 2.2 % stroke rate/year from a score of 2  Above score calculated as 1 point each if present [CHF, HTN, DM, Vascular=MI/PAD/Aortic Plaque, Age if 12-74, or Male];  2 points each if present [Age > 75, or Stroke/TIA/TE] (no imaging studies to suggest Aortic Plaque)   I spent a total of 25 minutes with the patient and chart review. >  50% of the time was spent in direct patient consultation.   Current medicines are reviewed at length with the patient today.  (+/- concerns) Now btw 70-75  The following changes have been made:  Recommend converting to Eliquis from ASA/Plavix; due for lipid check  Patient Instructions  Medication Instructions: Dr Ellyn Hack has recommended making the following medication changes: 1. STOP Aspirin 2. STOP Clopidogrel 3. START Eliqius 5 mg TWICE daily  Labwork: Discuss with your primary care doctor about checking your cholesterol  Testing/Procedures: NONE ORDERED  Follow-up: Dr Ellyn Hack recommends that you schedule a follow-up appointment in 12 months. You will receive a reminder letter in the mail two months in advance. If you don't receive a letter, please call our office to schedule the follow-up appointment.  If you need a refill on your cardiac medications before your next appointment, please call your pharmacy.    Studies Ordered:   Orders Placed This Encounter  Procedures  . EKG 12-Lead      Glenetta Hew, M.D., M.S. Interventional Cardiologist   Pager # [phone] Phone # [phone] [address] [place],   [address]   Thank you for choosing [facility] at [facility]!!

## 2018-03-22 ENCOUNTER — Encounter: Payer: Self-pay | Admitting: Cardiology

## 2018-03-22 NOTE — Assessment & Plan Note (Signed)
Probably related to venous stasis.  Controlled with support stockings (but not wearing during summertime months due to hot weather)

## 2018-03-22 NOTE — Assessment & Plan Note (Signed)
He remains totally a symptomatically A. fib.  Rate controlled with diltiazem.  As discussed in previous visits, we went back through his risk profile.  Given his age now being 72 years old, I think is probably not a bad idea to consider converting him to [place] at this time.  We went through the CHA2DS2Vasc score it HAS BLED scores of stroke prevention versus bleeding risk, and basically Eliquis to be safer than aspirin plus Plavix.    Based on recent trend in reimbursement, I think Eliquis should be tolerable.  Depending on his insurance would either use Eliquis or Xarelto.

## 2018-03-22 NOTE — Assessment & Plan Note (Signed)
The patient understands the need to lose weight with diet and exercise. We have discussed specific strategies for this.  

## 2018-03-22 NOTE — Assessment & Plan Note (Signed)
Well-controlled with compression stockings and foot elevation.  No current need for diuretic.

## 2018-03-22 NOTE — Assessment & Plan Note (Signed)
Blood pressure looks much better today having added spironolactone.  Last saw Racquel Rodriguez-Guzman, [place] in CVRR in Nov 2018 --> BP OK at that time --> discussed how to follow with home BP readings.  More consistent with current level.

## 2018-03-22 NOTE — Assessment & Plan Note (Signed)
Should be due for lipid evaluation.  Is on statin.  I have not seen labs recently.

## 2018-03-27 ENCOUNTER — Other Ambulatory Visit: Payer: Self-pay | Admitting: Cardiology

## 2018-03-27 NOTE — Telephone Encounter (Signed)
Rx request sent to pharmacy.  

## 2018-03-29 ENCOUNTER — Other Ambulatory Visit: Payer: Self-pay | Admitting: Cardiology

## 2018-03-30 ENCOUNTER — Ambulatory Visit: Payer: Medicare Other | Admitting: Podiatry

## 2018-03-30 ENCOUNTER — Encounter: Payer: Self-pay | Admitting: Podiatry

## 2018-03-30 DIAGNOSIS — L97519 Non-pressure chronic ulcer of other part of right foot with unspecified severity: Secondary | ICD-10-CM

## 2018-03-30 DIAGNOSIS — I83015 Varicose veins of right lower extremity with ulcer other part of foot: Secondary | ICD-10-CM

## 2018-03-30 DIAGNOSIS — L97512 Non-pressure chronic ulcer of other part of right foot with fat layer exposed: Secondary | ICD-10-CM | POA: Diagnosis not present

## 2018-03-30 MED ORDER — CLINDAMYCIN HCL 300 MG PO CAPS: 300.0000 mg | ORAL_CAPSULE | Freq: Three times a day (TID) | ORAL | 0 refills | Status: DC

## 2018-03-31 NOTE — Telephone Encounter (Signed)
Rx request sent to pharmacy.  

## 2018-04-04 NOTE — Progress Notes (Signed)
   Subjective:  72 year old male presenting today for follow up evaluation of an ulceration of the right third toe. He states he is doing well and believes the wound is healing. He denies any pain or modifying factors. He has been applying gentamicin cream daily with a bandage. Patient is here for further evaluation and treatment.   Past Medical History:  Diagnosis Date  . 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 Chronic atrial fibrillation ([facility]) 8/132014 office note   chronic afib dates back to June 2012; CHADS2-VASc score 2; candidate for dual  antiplatelet therapy based on active C and active S STUDIES  for thromboembolic prophyaxis--actually ot on coumadin but on aspirin ad plavix  . Dysrhythmia   . Former light tobacco smoker     smoked a pipe; quit 25+ years ago   . Hypertension   . Obesity (BMI 30.0-34.9)   . 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  Dermatology:  Wound #1 noted to the right third toe measuring 0.3 x 0.3 x 0.1 cm (LxWxD).   To the noted ulceration(s), there is no eschar. There is a moderate amount of slough, fibrin, and necrotic tissue noted. Granulation tissue and wound base is red. There is a minimal amount of serosanguineous drainage noted. There is no exposed bone muscle-tendon ligament or joint. There is no malodor. Periwound integrity is intact. Skin is warm, dry and supple bilateral lower extremities.  Vascular: Palpable pedal pulses bilaterally. Mild edema noted. Capillary refill within normal limits. Varicosities noted bilateral lower extremities.   Neurological: Epicritic and protective threshold diminished bilaterally.   Musculoskeletal Exam: Range of motion within normal limits to all pedal and ankle joints bilateral. Muscle strength 5/5 in all groups bilateral.   Assessment: #1  ulceration of the right 3rd toe secondary to venous insufficiency #2 varicosities bilateral lower extremities  Plan of Care:  #1 Patient was evaluated. 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  #2 medically necessary excisional debridement including subcutaneous tissue was performed using a tissue nipper and a chisel blade. Excisional debridement of all the necrotic nonviable tissue down to healthy bleeding viable tissue was performed with post-debridement measurements same as pre-. #3 the wound was cleansed with normal saline. Dry sterile dressing applied.  #4 Prescription for Clindamycin 300 mg three time daily for 10 days based on cultures provided to patient.  #5 Continue using gentamicin cream daily with a bandage.  #6 Return to clinic in 3 weeks.     Edrick Kins, DPM [facility]  Dr. Edrick Kins, [place]                                        [place], [place] [address]                Office [phone]  Fax [phone]

## 2018-04-20 ENCOUNTER — Ambulatory Visit: Payer: Medicare Other | Admitting: Podiatry

## 2018-04-20 ENCOUNTER — Other Ambulatory Visit: Payer: Self-pay

## 2018-04-20 DIAGNOSIS — L97512 Non-pressure chronic ulcer of other part of right foot with fat layer exposed: Secondary | ICD-10-CM | POA: Diagnosis not present

## 2018-04-20 DIAGNOSIS — I83015 Varicose veins of right lower extremity with ulcer other part of foot: Secondary | ICD-10-CM | POA: Diagnosis not present

## 2018-04-20 DIAGNOSIS — L97519 Non-pressure chronic ulcer of other part of right foot with unspecified severity: Secondary | ICD-10-CM | POA: Diagnosis not present

## 2018-04-22 NOTE — Progress Notes (Signed)
   Subjective:  72 year old male presenting today for follow up evaluation of an ulceration of the right third toe. He states the wound is improving. He has been using gentamicin cream with a bandage as directed. There are no modifying factors noted. Patient is here for further evaluation and treatment.   Past Medical History:  Diagnosis Date  . 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 Chronic atrial fibrillation ([facility]) 8/132014 office note   chronic afib dates back to June 2012; CHADS2-VASc score 2; candidate for dual  antiplatelet therapy based on active C and active S STUDIES  for thromboembolic prophyaxis--actually ot on coumadin but on aspirin ad plavix  . Dysrhythmia   . Former light tobacco smoker     smoked a pipe; quit 25+ years ago   . Hypertension   . Obesity (BMI 30.0-34.9)   . 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  Dermatology:  Wound #1 noted to the right third toe measuring 0.2 x 0.2 x 0.1 cm (LxWxD).   To the noted ulceration(s), there is no eschar. There is a moderate amount of slough, fibrin, and necrotic tissue noted. Granulation tissue and wound base is red. There is a minimal amount of serosanguineous drainage noted. There is no exposed bone muscle-tendon ligament or joint. There is no malodor. Periwound integrity is intact. Skin is warm, dry and supple bilateral lower extremities.  Vascular: Palpable pedal pulses bilaterally. Mild edema noted. Capillary refill within normal limits. Varicosities noted bilateral lower extremities.   Neurological: Epicritic and protective threshold diminished bilaterally.   Musculoskeletal Exam: Range of motion within normal limits to all pedal and ankle joints bilateral. Muscle strength 5/5 in all groups bilateral.   Assessment: #1 ulceration of the right 3rd toe  secondary to venous insufficiency #2 varicosities bilateral lower extremities  Plan of Care:  #1 Patient was evaluated.  #2 medically necessary excisional debridement including subcutaneous tissue was performed using a tissue nipper and a chisel blade. Excisional debridement of all the necrotic nonviable tissue down to healthy bleeding viable tissue was performed with post-debridement measurements same as pre-. #3 the wound was cleansed with normal saline. Dry sterile dressing applied.  #4 Continue using gentamicin cream daily with a bandage.  #5 Continue wearing good shoe gear.  #6 Return to clinic in 3 weeks.     Edrick Kins, DPM [facility]  Dr. Edrick Kins, [place]                                        [place], [place] [address]                Office [phone]  Fax [phone]

## 2018-05-12 ENCOUNTER — Other Ambulatory Visit: Payer: Self-pay | Admitting: Cardiology

## 2018-05-13 ENCOUNTER — Ambulatory Visit (INDEPENDENT_AMBULATORY_CARE_PROVIDER_SITE_OTHER): Payer: Medicare Other | Admitting: Podiatry

## 2018-05-13 ENCOUNTER — Ambulatory Visit: Payer: Medicare Other | Admitting: Podiatry

## 2018-05-13 ENCOUNTER — Encounter: Payer: Self-pay | Admitting: Podiatry

## 2018-05-13 DIAGNOSIS — L97512 Non-pressure chronic ulcer of other part of right foot with fat layer exposed: Secondary | ICD-10-CM | POA: Diagnosis not present

## 2018-05-13 DIAGNOSIS — I83015 Varicose veins of right lower extremity with ulcer other part of foot: Secondary | ICD-10-CM

## 2018-05-13 DIAGNOSIS — L97519 Non-pressure chronic ulcer of other part of right foot with unspecified severity: Secondary | ICD-10-CM

## 2018-05-19 NOTE — Progress Notes (Signed)
   Subjective:  72 year old male presenting today for follow up evaluation of an ulceration of the right third toe. He states he is doing well. He denies any pain or modifying factors. He has been using gentamicin cream daily. Patient is here for further evaluation and treatment.   Past Medical History:  Diagnosis Date  . 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 Chronic atrial fibrillation ([place]) 8/132014 office note   chronic afib dates back to June 2012; CHADS2-VASc score 2; candidate for dual  antiplatelet therapy based on active C and active S STUDIES  for thromboembolic prophyaxis--actually ot on coumadin but on aspirin ad plavix  . Dysrhythmia   . Former light tobacco smoker     smoked a pipe; quit 25+ years ago   . Hypertension   . Obesity (BMI 30.0-34.9)   . 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  Dermatology:  Wound #1 noted to the right third toe measuring 0.2 x 0.2 x 0.1 cm (LxWxD).   To the noted ulceration(s), there is no eschar. There is a moderate amount of slough, fibrin, and necrotic tissue noted. Granulation tissue and wound base is red. There is a minimal amount of serosanguineous drainage noted. There is no exposed bone muscle-tendon ligament or joint. There is no malodor. Periwound integrity is intact. Skin is warm, dry and supple bilateral lower extremities.  Vascular: Palpable pedal pulses bilaterally. Mild edema noted. Capillary refill within normal limits. Varicosities noted bilateral lower extremities.   Neurological: Epicritic and protective threshold diminished bilaterally.   Musculoskeletal Exam: Range of motion within normal limits to all pedal and ankle joints bilateral. Muscle strength 5/5 in all groups bilateral.   Assessment: #1 ulceration of the right 3rd toe secondary to venous  insufficiency #2 varicosities bilateral lower extremities  Plan of Care:  #1 Patient was evaluated.  #2 medically necessary excisional debridement including subcutaneous tissue was performed using a tissue nipper and a chisel blade. Excisional debridement of all the necrotic nonviable tissue down to healthy bleeding viable tissue was performed with post-debridement measurements same as pre-. #3 the wound was cleansed with normal saline. Dry sterile dressing applied.  #4 Continue using gentamicin cream daily with a bandage.  #5 Offloading crescent toe pad dispensed.  #6 Return to clinic in 3 weeks.    Real estate agent.    Edrick Kins, DPM [facility]  Dr. Edrick Kins, [place]                                        [place], [place] [address]                Office [phone]  Fax [phone]

## 2018-06-03 ENCOUNTER — Encounter: Payer: Self-pay | Admitting: Podiatry

## 2018-06-03 ENCOUNTER — Ambulatory Visit: Payer: Medicare Other | Admitting: Podiatry

## 2018-06-03 DIAGNOSIS — L97519 Non-pressure chronic ulcer of other part of right foot with unspecified severity: Secondary | ICD-10-CM

## 2018-06-03 DIAGNOSIS — I83015 Varicose veins of right lower extremity with ulcer other part of foot: Secondary | ICD-10-CM | POA: Diagnosis not present

## 2018-06-03 DIAGNOSIS — L97512 Non-pressure chronic ulcer of other part of right foot with fat layer exposed: Secondary | ICD-10-CM

## 2018-06-06 NOTE — Progress Notes (Signed)
   Subjective:  72 year old male presenting today for follow up evaluation of an ulceration of the right third toe. He states the wound has improved significantly. He denies any pain or modifying factors. He has been using the offloading toe crest pad for treatment. Patient is here for further evaluation and treatment.   Past Medical History:  Diagnosis Date  . 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 Chronic atrial fibrillation 8/132014 office note   chronic afib dates back to June 2012; CHADS2-VASc score 2; candidate for dual  antiplatelet therapy based on active C and active S STUDIES  for thromboembolic prophyaxis--actually ot on coumadin but on aspirin ad plavix  . Dysrhythmia   . Former light tobacco smoker     smoked a pipe; quit 25+ years ago   . Hypertension   . Obesity (BMI 30.0-34.9)   . 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  Dermatology:  Wound noted to the right third toe has healed. Complete re-epithelialization has occurred. No drainage noted. Skin is warm, dry and supple bilateral lower extremities. Negative for open lesions or macerations.  Vascular: Palpable pedal pulses bilaterally. Mild edema noted. Capillary refill within normal limits. Varicosities noted bilateral lower extremities.   Neurological: Epicritic and protective threshold diminished bilaterally.   Musculoskeletal Exam: Range of motion within normal limits to all pedal and ankle joints bilateral. Muscle strength 5/5 in all groups bilateral.   Assessment: #1 ulceration of the right 3rd toe secondary to venous insufficiency - healed  #2 varicosities bilateral lower extremities  Plan of Care:  #1 Patient was evaluated.  #2 Recommended good shoe gear.  #3 Continue using offloading toe crest pad and good shoe gear.  #4 Return to clinic as  needed.   Real estate agent.    Edrick Kins, DPM [facility]  Dr. Edrick Kins, [place]                                        [place], [place] [address]                Office [phone]  Fax [phone]

## 2018-07-12 DIAGNOSIS — M65342 Trigger finger, left ring finger: Secondary | ICD-10-CM | POA: Insufficient documentation

## 2018-07-14 ENCOUNTER — Other Ambulatory Visit: Payer: Self-pay

## 2018-07-14 ENCOUNTER — Other Ambulatory Visit: Payer: Self-pay | Admitting: Pharmacist Clinician (PhC)/ Clinical Pharmacy Specialist

## 2018-07-14 MED ORDER — APIXABAN 5 MG PO TABS: 5.0000 mg | ORAL_TABLET | Freq: Two times a day (BID) | ORAL | 1 refills | Status: DC

## 2018-08-12 ENCOUNTER — Ambulatory Visit: Payer: Medicare Other | Admitting: Podiatry

## 2018-08-12 ENCOUNTER — Encounter: Payer: Self-pay | Admitting: Podiatry

## 2018-08-12 DIAGNOSIS — L97512 Non-pressure chronic ulcer of other part of right foot with fat layer exposed: Secondary | ICD-10-CM

## 2018-08-12 DIAGNOSIS — I83015 Varicose veins of right lower extremity with ulcer other part of foot: Secondary | ICD-10-CM

## 2018-08-12 DIAGNOSIS — L97519 Non-pressure chronic ulcer of other part of right foot with unspecified severity: Secondary | ICD-10-CM

## 2018-08-16 NOTE — Progress Notes (Signed)
   Subjective:  72 year old male presenting today for follow up evaluation of an ulceration of the right third toe. He states he is doing well overall. He notes a callus lesion to the left hallux. He denies any modifying factors and has not been doing anything for treatment. Patient is here for further evaluation and treatment.   Past Medical History:  Diagnosis Date  . 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 Chronic atrial fibrillation 8/132014 office note   chronic afib dates back to June 2012; CHADS2-VASc score 2; candidate for dual  antiplatelet therapy based on active C and active S STUDIES  for thromboembolic prophyaxis--actually ot on coumadin but on aspirin ad plavix  . Dysrhythmia   . Former light tobacco smoker     smoked a pipe; quit 25+ years ago   . Hypertension   . Obesity (BMI 30.0-34.9)   . 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  Dermatology:  Wound #1 noted to the right third toe measuring 0.4 x 0.4 x 0.1 cm.  To the above-noted ulceration, there is no eschar. There is a moderate amount of slough, fibrin and necrotic tissue. Granulation tissue and wound base is red. There is no malodor. There is a minimal amount of serosanginous drainage noted. Periwound integrity is intact.   Vascular: Palpable pedal pulses bilaterally. Mild edema noted. Capillary refill within normal limits. Varicosities noted bilateral lower extremities.   Neurological: Epicritic and protective threshold diminished bilaterally.   Musculoskeletal Exam: Range of motion within normal limits to all pedal and ankle joints bilateral. Muscle strength 5/5 in all groups bilateral.   Assessment: #1 recurrence of an ulceration of the right 3rd toe secondary to venous insufficiency #2 varicosities bilateral lower extremities  Plan of Care:  #1  Patient was evaluated.  #2 Medically necessary excisional debridement including subcutaneous tissue was performed using a tissue nipper and a chisel blade. Excisional debridement of all the necrotic nonviable tissue down to healthy bleeding viable tissue was performed with post-debridement measurements same as pre-. #3 The wound was cleansed and dry sterile dressing applied. #4 Resume using offloading silicone toe sleeve.  #5 Resume using Gentamicin cream daily with a bandage.  #6 Patient may benefit from flexor tenotomy at some point.  #7 Return to clinic in 3 weeks.    Real estate agent.    Edrick Kins, DPM [facility]  Dr. Edrick Kins, [address]                                        [place], [place] [address]                Office [phone]  Fax [phone]

## 2018-08-31 ENCOUNTER — Ambulatory Visit: Payer: Medicare Other | Admitting: Podiatry

## 2018-08-31 DIAGNOSIS — I83015 Varicose veins of right lower extremity with ulcer other part of foot: Secondary | ICD-10-CM

## 2018-08-31 DIAGNOSIS — L97512 Non-pressure chronic ulcer of other part of right foot with fat layer exposed: Secondary | ICD-10-CM

## 2018-08-31 DIAGNOSIS — L97519 Non-pressure chronic ulcer of other part of right foot with unspecified severity: Secondary | ICD-10-CM

## 2018-09-02 NOTE — Progress Notes (Signed)
   Subjective:  73 year old male presenting today for follow up evaluation of an ulceration of the right third toe. He states he is doing well and denies any pain or drainage. He has been using the toe sleeve and a bandage for treatment. He denies any new concerns at this time. Patient is here for further evaluation and treatment.   Past Medical History:  Diagnosis Date  . 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 Chronic atrial fibrillation 8/132014 office note   chronic afib dates back to June 2012; CHADS2-VASc score 2; candidate for dual  antiplatelet therapy based on active C and active S STUDIES  for thromboembolic prophyaxis--actually ot on coumadin but on aspirin ad plavix  . Dysrhythmia   . Former light tobacco smoker     smoked a pipe; quit 25+ years ago   . Hypertension   . Obesity (BMI 30.0-34.9)   . 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  Dermatology:  Wound #1 noted to the right third toe measuring 0.1 x 0.1 x 0.1 cm.  To the above-noted ulceration, there is no eschar. There is a moderate amount of slough, fibrin and necrotic tissue. Granulation tissue and wound base is red. There is no malodor. There is a minimal amount of serosanginous drainage noted. Periwound integrity is intact.   Vascular: Palpable pedal pulses bilaterally. Mild edema noted. Capillary refill within normal limits. Varicosities noted bilateral lower extremities.   Neurological: Epicritic and protective threshold diminished bilaterally.   Musculoskeletal Exam: Range of motion within normal limits to all pedal and ankle joints bilateral. Muscle strength 5/5 in all groups bilateral.   Assessment: #1 recurrence of an ulceration of the right 3rd toe secondary to venous insufficiency #2 varicosities bilateral lower extremities  Plan of Care:    #1 Patient was evaluated.  #2 Medically necessary excisional debridement including subcutaneous tissue was performed using a tissue nipper and a chisel blade. Excisional debridement of all the necrotic nonviable tissue down to healthy bleeding viable tissue was performed with post-debridement measurements same as pre-. #3 The wound was cleansed and dry sterile dressing applied. #4 Continue using antibiotic ointment daily with a bandage for one week.  #5 Continue using offloading silicone toe sleeve.  #6 Recommended good shoe gear.  #7 Return to clinic as needed.    Real estate agent.    Edrick Kins, DPM [facility]  Dr. Edrick Kins, [place]                                        [place], [place] [address]                Office [phone]  Fax [phone]

## 2018-09-17 ENCOUNTER — Telehealth: Payer: Self-pay | Admitting: Cardiology

## 2018-09-17 MED ORDER — APIXABAN 5 MG PO TABS: 5.0000 mg | ORAL_TABLET | Freq: Two times a day (BID) | ORAL | 1 refills | Status: DC

## 2018-09-17 NOTE — Telephone Encounter (Signed)
Rx request for Eliquis 5 mg sent in to [facility] per fax request.

## 2018-09-21 ENCOUNTER — Telehealth: Payer: Self-pay | Admitting: Cardiology

## 2018-09-21 NOTE — Telephone Encounter (Signed)
New Message   Pt c/o medication issue:  1. Name of Medication: Eliquis & Diltiazem  2. How are you currently taking this medication (dosage and times per day)?   3. Are you having a reaction (difficulty breathing--STAT)? No   4. What is your medication issue? Ratna from [facility] is calling because there is a drug interaction between the 2 medications  She said she also sent a fax regarding this

## 2018-09-22 NOTE — Telephone Encounter (Signed)
Spoke with pt pharmacy, okay given for combination to be taken together.

## 2018-09-22 NOTE — Telephone Encounter (Signed)
Okay to continue with both medications. Renal function remains appropriate and no s/sx of bleeding noted.

## 2018-11-10 ENCOUNTER — Other Ambulatory Visit: Payer: Self-pay | Admitting: Cardiovascular Disease

## 2018-12-14 ENCOUNTER — Other Ambulatory Visit: Payer: Self-pay | Admitting: Cardiology

## 2018-12-18 ENCOUNTER — Other Ambulatory Visit: Payer: Self-pay | Admitting: Cardiology

## 2018-12-18 NOTE — Telephone Encounter (Signed)
Generic Avalide 300-12.5 mg refilled.

## 2019-02-03 DIAGNOSIS — M25561 Pain in right knee: Secondary | ICD-10-CM | POA: Insufficient documentation

## 2019-02-08 ENCOUNTER — Other Ambulatory Visit: Payer: Self-pay

## 2019-02-08 ENCOUNTER — Ambulatory Visit (INDEPENDENT_AMBULATORY_CARE_PROVIDER_SITE_OTHER): Payer: Medicare Other

## 2019-02-08 ENCOUNTER — Ambulatory Visit: Payer: Medicare Other | Admitting: Podiatry

## 2019-02-08 DIAGNOSIS — L97512 Non-pressure chronic ulcer of other part of right foot with fat layer exposed: Secondary | ICD-10-CM

## 2019-02-08 DIAGNOSIS — L97519 Non-pressure chronic ulcer of other part of right foot with unspecified severity: Secondary | ICD-10-CM

## 2019-02-08 DIAGNOSIS — I83015 Varicose veins of right lower extremity with ulcer other part of foot: Secondary | ICD-10-CM

## 2019-02-12 NOTE — Progress Notes (Signed)
   Subjective:  73 year old male presenting today with a chief complaint of a wound to the right fourth toe that appeared about two months ago. He states the area started as a blister and has now turned dark. He reports associated swelling. He has been applying Silvadene cream for treatment. There are no modifying factors noted. Patient is here for further evaluation and treatment.   Past Medical History:  Diagnosis Date  . 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 Chronic atrial fibrillation 8/132014 office note   chronic afib dates back to June 2012; CHADS2-VASc score 2; candidate for dual  antiplatelet therapy based on active C and active S STUDIES  for thromboembolic prophyaxis--actually ot on coumadin but on aspirin ad plavix  . Dysrhythmia   . Former light tobacco smoker     smoked a pipe; quit 25+ years ago   . Hypertension   . Obesity (BMI 30.0-34.9)   . 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  Dermatology:  Wound #1 noted to the right fourth toe measuring 0.6 x 0.6 x 0.2 cm (LxWxD).   To the noted ulceration(s), there is no eschar. There is a moderate amount of slough, fibrin, and necrotic tissue noted. Granulation tissue and wound base is red. There is a minimal amount of serosanguineous drainage noted. There is no exposed bone muscle-tendon ligament or joint. There is no malodor. Periwound integrity is intact. Skin is warm, dry and supple bilateral lower extremities.  Vascular: Palpable pedal pulses bilaterally. Mild edema noted. Capillary refill within normal limits. Varicosities noted bilateral lower extremities.   Neurological: Epicritic and protective threshold diminished bilaterally.   Musculoskeletal Exam: Range of motion within normal limits to all pedal and ankle joints bilateral. Muscle strength 5/5 in all  groups bilateral.   Assessment: #1 ulceration of the right fourth toe secondary to venous insufficiency #2 varicosities bilateral lower extremities  Plan of Care:  #1 Patient was evaluated. #2 medically necessary excisional debridement including subcutaneous tissue was performed using a tissue nipper and a chisel blade. Excisional debridement of all the necrotic nonviable tissue down to healthy bleeding viable tissue was performed with post-debridement measurements same as pre-. #3 the wound was cleansed with normal saline. #4 Continue using Silvadene cream daily with a bandage.  #5 Silicone toe spacers provided.  #6 Return to clinic in 3 weeks.   Real estate agent.    Edrick Kins, DPM [facility]  Dr. Edrick Kins, [place]                                        [place], [place] [address]                Office [phone]  Fax [phone]

## 2019-02-25 ENCOUNTER — Telehealth: Payer: Self-pay

## 2019-02-25 ENCOUNTER — Telehealth: Payer: Self-pay | Admitting: Adult Health

## 2019-02-25 NOTE — Telephone Encounter (Signed)
PharmD to review eliquis. Pending surgical clearance by Jory Sims NP on 6/30.

## 2019-02-25 NOTE — Telephone Encounter (Signed)
Phone visit/my chart/pre reg complete/consent obtained -- ttf °

## 2019-02-25 NOTE — Telephone Encounter (Signed)
   [facility] Pre-operative Risk Assessment    Request for surgical clearance:  1. What type of surgery is being performed? Right Total Knee Arthroplasty   2. When is this surgery scheduled? TBD--tentatively scheduled    3. What type of clearance is required (medical clearance vs. Pharmacy clearance to hold med vs. Both)? Both  4. Are there any medications that need to be held prior to surgery and how long? Eliquis, Plavix, ASA 81--length of time to hold not specified   5. Practice name and name of physician performing surgery? [facility]; Dr. Esmond Plants   6. What is your office phone number (336) [phone]    7.   What is your office fax number [phone] ATTN: Adline Potter  8.   Anesthesia type (None, local, MAC, general) ? Spinal   Alex Alvarez 02/25/2019, 3:32 PM  _________________________________________________________________   (provider comments below)

## 2019-02-26 NOTE — Telephone Encounter (Signed)
Okay thank you

## 2019-02-26 NOTE — Telephone Encounter (Signed)
Alex Alvarez, see recommendation by clinical pharmacist, [name] is scheduled to see you as part of his preoperative clearance.

## 2019-02-26 NOTE — Telephone Encounter (Signed)
Pt takes Eliquis for afib with CHADS2VASc score of 2 (age, HTN). SCr 1.28 on 01/08/19 in Care Everywhere, CrCl 31mL/min. Recommend holding Eliquis for 3 days prior to TKA.

## 2019-03-01 ENCOUNTER — Ambulatory Visit: Payer: Medicare Other | Admitting: Podiatry

## 2019-03-01 ENCOUNTER — Other Ambulatory Visit: Payer: Self-pay

## 2019-03-01 VITALS — Temp 98.1°F

## 2019-03-01 DIAGNOSIS — L97519 Non-pressure chronic ulcer of other part of right foot with unspecified severity: Secondary | ICD-10-CM

## 2019-03-01 DIAGNOSIS — L97512 Non-pressure chronic ulcer of other part of right foot with fat layer exposed: Secondary | ICD-10-CM | POA: Diagnosis not present

## 2019-03-01 DIAGNOSIS — I83015 Varicose veins of right lower extremity with ulcer other part of foot: Secondary | ICD-10-CM

## 2019-03-01 NOTE — Progress Notes (Signed)
Virtual Visit via Video Note   This visit type was conducted due to national recommendations for restrictions regarding the COVID-19 Pandemic (e.g. social distancing) in an effort to limit this patient's exposure and mitigate transmission in our community.  Due to his co-morbid illnesses, this patient is at least at moderate risk for complications without adequate follow up.  This format is felt to be most appropriate for this patient at this time.  All issues noted in this document were discussed and addressed.  A limited physical exam was performed with this format.  Please refer to the patient's chart for his consent to telehealth for [facility].   Date:  03/02/2019   ID:  Alex Alvarez, DOB 09-Jul-1946, MRN [id number]  Patient Location: Home Provider Location: Office  PCP:  Christain Sacramento, MD  Cardiologist:  Dr. . Ellyn Hack  Electrophysiologist:  None   Evaluation Performed:  Follow-Up Visit  Chief Complaint:  Pre-Operative Clearance   History of Present Illness:    Alex Alvarez is a 74 y.o. male we are following for ongoing assessment and management of atrial fibrillation, hypertension, hyperlipidemia, chronic lower extremity edema from venous stasis.  He is a former patient of Dr. Rollene Fare.  He was last seen in the office by Dr. Ellyn Hack on 03/20/2018 and was doing very well.  He did notice that his atrial fibrillation was more prominent when he was exerting himself and his heart rate was going fast otherwise he had not noticed any irregular beats at rest.  He admitted to being out of shape and does get short of breath when he overexerts himself or walks too far.  He was continued on Eliquis for CHADS Vasc Score of 2.   He comes today for preoperative evaluation to have total knee replacement and pharmacological recommendations concerning anticoagulation therapy. He is already had recommendations to hold Eliquis for 3 days prior to have right TKA, per Pharm.D. evaluation, planned  surgery with Dr. Venora Maples, through [facility], on date to be determined after clearance.  On video visit today the patient is without complaints.  He denies any shortness of breath, chest pressure, breast pain from spironolactone, or rapid heart rhythm.  He denies any melena or hemoptysis.  He remains as active as he can despite his knee pain.  He states when his knee gets out of joint or unstable it causes him pain but otherwise he tries to remain active.  He is medically compliant and does not require refills at this time.  The patient does not have symptoms concerning for COVID-19 infection (fever, chills, cough, or new shortness of breath).    Past Medical History:  Diagnosis Date  . 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 Chronic atrial fibrillation 8/132014 office note   chronic afib dates back to June 2012; CHADS2-VASc score 2; candidate for dual  antiplatelet therapy based on active C and active S STUDIES  for thromboembolic prophyaxis--actually ot on coumadin but on aspirin ad plavix  . Dysrhythmia   . Former light tobacco smoker     smoked a pipe; quit 25+ years ago   . Hypertension   . Obesity (BMI 30.0-34.9)   . Senile calcific aortic valve sclerosis 10/2012   No stenosis   Past Surgical History:  Procedure Laterality Date  . Aorta doppler  11/12/2012   normal abdomnal aortic duplex   . JOINT REPLACEMENT Left Jul 07, 2015  .  NM MYOVIEW LTD  07/30/2011   non-gated study seconadry to a fib;exercise capcity 7 mets--mildly reduced;low risk scan  . SKIN BIOPSY N/A sept 2016   Basal cell from face.  . TONSILLECTOMY    . TOTAL KNEE ARTHROPLASTY Left 07/07/2015   Procedure: LEFT TOTAL KNEE ARTHROPLASTY;  Surgeon: Netta Cedars, MD;  Location: [facility];  Service: Orthopedics;  Laterality: Left;  . TRANSTHORACIC ECHOCARDIOGRAM  02/22/2011   EF 50-55%; with mild inferior wall hypokinesis, mild left atrial enlargement of 4.2cm  .  TRANSTHORACIC ECHOCARDIOGRAM  11/12/2012   afib; EF 50-55% (CRO WMA b/c Afib - difficult gaiting). AoV sclerosis without stenosis     Current Meds  Medication Sig  . apixaban (ELIQUIS) 5 MG TABS tablet Take 1 tablet (5 mg total) by mouth 2 (two) times daily.  [name] aspirin EC 81 MG tablet Take 2 tablets daily.  [name] atorvastatin (LIPITOR) 10 MG tablet Take 1 tablet by mouth  daily  . diclofenac (VOLTAREN) 75 MG EC tablet Take 75 mg by mouth daily.  [name] diltiazem (CARDIZEM CD) 240 MG 24 hr capsule Take by mouth as directed.   [name] EPINEPHrine (EPIPEN 2-PAK) 0.3 mg/0.3 mL IJ SOAJ injection EpiPen 2-Pak 0.3 mg/0.3 mL injection, auto-injector  USE AS DIRECTED  . gentamicin cream (GARAMYCIN) 0.1 % gentamicin 0.1 % topical cream  APPLY 1 APPLICATION TOPICALLY 3 (THREE) TIMES DAILY.  . methocarbamol (ROBAXIN) 500 MG tablet Take by mouth as directed.   . Multiple Vitamins-Minerals (MULTIVITAMIN WITH MINERALS) tablet Take 1 tablet by mouth daily.  [name] SM MULTIPLE VITAMINS/IRON TABS Take by mouth as directed.   [name] spironolactone (ALDACTONE) 25 MG tablet TAKE ONE-HALF TABLET BY  MOUTH DAILY  . Suvorexant (BELSOMRA) 10 MG TABS TAKE 1 TABLET AT BEDTIME AS NEEDED SLEEP  . terbinafine (LAMISIL) 250 MG tablet TAKE 1 TABLET EVERY 2 DAYS FOR TOENAIL FUNGUS  . zolpidem (AMBIEN) 10 MG tablet Take 10 mg by mouth at bedtime as needed for sleep.  . [DISCONTINUED] clindamycin (CLEOCIN) 300 MG capsule Take 1 capsule (300 mg total) by mouth 3 (three) times daily.  . [DISCONTINUED] clopidogrel (PLAVIX) 75 MG tablet      Allergies:   Copper, Lipitor [atorvastatin], and Terbinafine   Social History   Tobacco Use  . Smoking status: Former Smoker    Types: Pipe    Quit date: 10/24/2011    Years since quitting: 7.3  . Smokeless tobacco: Never Used  Substance Use Topics  . Alcohol use: No  . Drug use: No     Family Hx: The patient's family history includes Cancer in his maternal grandfather; Cancer - Ovarian in his mother.   ROS:   Please see the history of present illness.    All other systems reviewed and are negative.   Prior CV studies:   The following studies were reviewed today: Echocardiogram 11/13/2018 Left ventricle: The cavity size was normal. Systolic  function was normal. The estimated ejection fraction was  in the range of 50% to 55%. Regional wall motion  abnormalities cannot be excluded. The study is not  technically sufficient to allow evaluation of LV diastolic  function.  - Aortic valve: Trivial regurgitation.  - Mitral valve: Mild regurgitation.  - Atrial septum: No defect or patent foramen ovale was  identified.   Labs/Other Tests and Data Reviewed:    EKG:  No ECG reviewed.  Most recent EKG dated 03/20/2018 revealed rate controlled atrial fibrillation.  Recent Labs: No results found for requested labs within  last 8760 hours.   Recent Lipid Panel No results found for: CHOL, TRIG, HDL, CHOLHDL, LDLCALC, LDLDIRECT  Wt Readings from Last 3 Encounters:  03/02/19 234 lb (106.1 kg)  03/20/18 245 lb 9.6 oz (111.4 kg)  06/12/17 240 lb 12.8 oz (109.2 kg)     Objective:    Vital Signs:  BP 138/87   Pulse 83   Ht 6' (1.829 m)   Wt 234 lb (106.1 kg)   BMI 31.74 kg/m    VITAL SIGNS:  reviewed GEN:  no acute distress RESPIRATORY:  normal respiratory effort, symmetric expansion NEURO:  alert and oriented x 3, no obvious focal deficit PSYCH:  normal affect  ASSESSMENT & PLAN:    1.  Preoperative cardiac evaluation:   Chart reviewed as part of pre-operative protocol coverage. Given past medical history and time since last visit, based on ACC/AHA guidelines, Alex Alvarez would be at acceptable risk for the planned procedure without further cardiovascular testing.  He will need to hold Eliquis 3 days prior to surgery, and begin the following day after surgery is complete.  This is also explained to the patient on this visit.  2.  Atrial fibrillation: Heart rate is  been well controlled based upon patient's blood pressure monitor running in the 70s to 80s.  He continues on diltiazem, and Eliquis with directions for holding Eliquis as above prior to surgery.  No changes in other cardiac medications.  3.  Hypertension: Currently controlled with diltiazem and spironolactone.  The patient denies any gynecomastia or breast tenderness on this medication.  He does have some mild lower extremity edema in the dependent position at times.  No change in his regimen.  4.  Hypercholesterolemia: Continue atorvastatin 10 mg daily.  Follow-up labs per PCP.   COVID-19 Education: The signs and symptoms of COVID-19 were discussed with the patient and how to seek care for testing (follow up with PCP or arrange E-visit).  The importance of social distancing was discussed today.  Time:   Today, I have spent 15 minutes with the patient with telehealth technology discussing the above problems.     Medication Adjustments/Labs and Tests Ordered: Current medicines are reviewed at length with the patient today.  Concerns regarding medicines are outlined above.   Tests Ordered: No orders of the defined types were placed in this encounter.   Medication Changes: No orders of the defined types were placed in this encounter.   Disposition:  Follow up 6 months  Signed, Phill Myron. West Pugh, ANP, AACC  03/02/2019 1:55 PM    [facility]

## 2019-03-02 ENCOUNTER — Telehealth: Payer: Self-pay

## 2019-03-02 ENCOUNTER — Telehealth (INDEPENDENT_AMBULATORY_CARE_PROVIDER_SITE_OTHER): Payer: Medicare Other | Admitting: Adult Health

## 2019-03-02 VITALS — BP 138/87 | HR 83 | Ht 72.0 in | Wt 234.0 lb

## 2019-03-02 DIAGNOSIS — I10 Essential (primary) hypertension: Secondary | ICD-10-CM | POA: Diagnosis not present

## 2019-03-02 DIAGNOSIS — E7800 Pure hypercholesterolemia, unspecified: Secondary | ICD-10-CM | POA: Diagnosis not present

## 2019-03-02 DIAGNOSIS — Z01818 Encounter for other preprocedural examination: Secondary | ICD-10-CM

## 2019-03-02 DIAGNOSIS — I4811 Longstanding persistent atrial fibrillation: Secondary | ICD-10-CM | POA: Diagnosis not present

## 2019-03-02 NOTE — Patient Instructions (Signed)
Medication Instructions:  Your physician recommends that you continue on your current medications as directed. Please refer to the Current Medication list given to you today.  If you need a refill on your cardiac medications before your next appointment, please call your pharmacy.   Lab work: NONE If you have labs (blood work) drawn today and your tests are completely normal, you will receive your results only by: [name] MyChart Message (if you have MyChart) OR . A paper copy in the mail If you have any lab test that is abnormal or we need to change your treatment, we will call you to review the results.  Testing/Procedures: NONE  Follow-Up: At [facility], you and your health needs are our priority.  As part of our continuing mission to provide you with exceptional heart care, we have created designated Provider Care Teams.  These Care Teams include your primary Cardiologist (physician) and Advanced Practice Providers (APPs -  Physician Assistants and Nurse Practitioners) who all work together to provide you with the care you need, when you need it. . You will need a follow up appointment in 6 months.  Please call our office 2 months in advance to schedule this appointment.  You may see DR. DAVID HARDING  Any Other Special Instructions Will Be Listed Below (If Applicable). YOU HAVE BEEN CLEARED, FROM A CARDIAC STANDPOINT, FOR YOUR UPCOMING PROCEDURE.

## 2019-03-02 NOTE — Telephone Encounter (Signed)
Patient and/or DPR-approved person aware of 6/30 AVS instructions and verbalized understanding.  AVS TO BE RELEASED TO [facility]

## 2019-03-03 NOTE — Progress Notes (Signed)
   Subjective:  73 year old male presenting today for follow up evaluation of an ulceration of the right fourth toe. He states he is doing well overall. He has been using the Silvadene cream as directed. He denies any modifying factors. Patient is here for further evaluation and treatment.   Past Medical History:  Diagnosis Date  . 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 Chronic atrial fibrillation 8/132014 office note   chronic afib dates back to June 2012; CHADS2-VASc score 2; candidate for dual  antiplatelet therapy based on active C and active S STUDIES  for thromboembolic prophyaxis--actually ot on coumadin but on aspirin ad plavix  . Dysrhythmia   . Former light tobacco smoker     smoked a pipe; quit 25+ years ago   . Hypertension   . Obesity (BMI 30.0-34.9)   . 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  Dermatology:  Wound #1 noted to the right fourth toe measuring 0.6 x 0.6 x 0.2 cm (LxWxD).   To the noted ulceration(s), there is no eschar. There is a moderate amount of slough, fibrin, and necrotic tissue noted. Granulation tissue and wound base is red. There is a minimal amount of serosanguineous drainage noted. There is no exposed bone muscle-tendon ligament or joint. There is no malodor. Periwound integrity is intact. Skin is warm, dry and supple bilateral lower extremities.  Vascular: Palpable pedal pulses bilaterally. Mild edema noted. Capillary refill within normal limits. Varicosities noted bilateral lower extremities.   Neurological: Epicritic and protective threshold diminished bilaterally.   Musculoskeletal Exam: Range of motion within normal limits to all pedal and ankle joints bilateral. Muscle strength 5/5 in all groups bilateral.   Assessment: #1 ulceration of the right fourth toe secondary to venous  insufficiency #2 varicosities bilateral lower extremities  Plan of Care:  #1 Patient was evaluated. #2 medically necessary excisional debridement including subcutaneous tissue was performed using a tissue nipper and a chisel blade. Excisional debridement of all the necrotic nonviable tissue down to healthy bleeding viable tissue was performed with post-debridement measurements same as pre-. #3 the wound was cleansed with normal saline. #4 Continue using Silvadene cream daily with a bandage.  #5 Recommended wide fitting shoes.   #6 Patient having right knee replacement on 03/26/2019.  #7 Return to clinic in 8 weeks.   Real estate agent.    Edrick Kins, DPM [facility]  Dr. Edrick Kins, [address]                                        [place], [place] [address]                Office [phone]  Fax [phone]

## 2019-03-03 NOTE — Progress Notes (Signed)
[id number].   At least the charge went in this time!  So weird.   KL

## 2019-03-10 NOTE — Telephone Encounter (Signed)
AVS not yet reviewed on mychart; mailed to pt address on file 7/8

## 2019-03-18 NOTE — H&P (Signed)
Patient's anticipated LOS is less than 2 midnights, meeting these requirements: - Younger than 54 - Lives within 1 hour of care - Has a competent adult at home to recover with post-op recover - NO history of  - Chronic pain requiring opiods  - Diabetes  - Coronary Artery Disease  - Heart failure  - Heart attack  - Stroke  - DVT/VTE  - Cardiac arrhythmia  - Respiratory Failure/COPD  - Renal failure  - Anemia  - Advanced Liver disease       Alex Alvarez is an 73 y.o. male.    Chief Complaint: right knee pain  HPI: Pt is a 73 y.o. male complaining of right knee pain for multiple years. Pain had continually increased since the beginning. X-rays in the clinic show end-stage arthritic changes of the right knee. Pt has tried various conservative treatments which have failed to alleviate their symptoms, including injections and therapy. Various options are discussed with the patient. Risks, benefits and expectations were discussed with the patient. Patient understand the risks, benefits and expectations and wishes to proceed with surgery.   PCP:  Christain Sacramento, MD  D/C Plans: Home  PMH: Past Medical History:  Diagnosis Date  . 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 Chronic atrial fibrillation 8/132014 office note   chronic afib dates back to June 2012; CHADS2-VASc score 2; candidate for dual  antiplatelet therapy based on active C and active S STUDIES  for thromboembolic prophyaxis--actually ot on coumadin but on aspirin ad plavix  . Dysrhythmia   . Former light tobacco smoker     smoked a pipe; quit 25+ years ago   . Hypertension   . Obesity (BMI 30.0-34.9)   . Senile calcific aortic valve sclerosis 10/2012   No stenosis    PSH: Past Surgical History:  Procedure Laterality Date  . Aorta doppler  11/12/2012   normal abdomnal aortic duplex   . JOINT REPLACEMENT Left Jul 07, 2015  . NM MYOVIEW LTD  07/30/2011   non-gated  study seconadry to a fib;exercise capcity 7 mets--mildly reduced;low risk scan  . SKIN BIOPSY N/A sept 2016   Basal cell from face.  . TONSILLECTOMY    . TOTAL KNEE ARTHROPLASTY Left 07/07/2015   Procedure: LEFT TOTAL KNEE ARTHROPLASTY;  Surgeon: Netta Cedars, MD;  Location: [facility];  Service: Orthopedics;  Laterality: Left;  . TRANSTHORACIC ECHOCARDIOGRAM  02/22/2011   EF 50-55%; with mild inferior wall hypokinesis, mild left atrial enlargement of 4.2cm  . TRANSTHORACIC ECHOCARDIOGRAM  11/12/2012   afib; EF 50-55% (CRO WMA b/c Afib - difficult gaiting). AoV sclerosis without stenosis    Social History:  reports that he quit smoking about 7 years ago. His smoking use included pipe. He has never used smokeless tobacco. He reports that he does not drink alcohol or use drugs.  Allergies:  Allergies  Allergen Reactions  . Copper Rash  . Lipitor [Atorvastatin] Other (See Comments)    Leg cramps  . Terbinafine Rash    Medications: No current facility-administered medications for this encounter.    Current Outpatient Medications  Medication Sig Dispense Refill  . apixaban (ELIQUIS) 5 MG TABS tablet Take 1 tablet (5 mg total) by mouth 2 (two) times daily. 180 tablet 1  . aspirin EC 81 MG tablet Take 2 tablets daily.    [name] atorvastatin (LIPITOR) 10 MG tablet Take 1 tablet by mouth  daily 90 tablet 3  .  diclofenac (VOLTAREN) 75 MG EC tablet Take 75 mg by mouth daily.    [name] diltiazem (CARDIZEM CD) 240 MG 24 hr capsule Take by mouth as directed.     [name] EPINEPHrine (EPIPEN 2-PAK) 0.3 mg/0.3 mL IJ SOAJ injection EpiPen 2-Pak 0.3 mg/0.3 mL injection, auto-injector  USE AS DIRECTED    . gentamicin cream (GARAMYCIN) 0.1 % gentamicin 0.1 % topical cream  APPLY 1 APPLICATION TOPICALLY 3 (THREE) TIMES DAILY.    . methocarbamol (ROBAXIN) 500 MG tablet Take by mouth as directed.     . Multiple Vitamins-Minerals (MULTIVITAMIN WITH MINERALS) tablet Take 1 tablet by mouth daily.    [name] SM MULTIPLE VITAMINS/IRON  TABS Take by mouth as directed.     [name] spironolactone (ALDACTONE) 25 MG tablet TAKE ONE-HALF TABLET BY  MOUTH DAILY 45 tablet 0  . Suvorexant (BELSOMRA) 10 MG TABS TAKE 1 TABLET AT BEDTIME AS NEEDED SLEEP    . terbinafine (LAMISIL) 250 MG tablet TAKE 1 TABLET EVERY 2 DAYS FOR TOENAIL FUNGUS    . zolpidem (AMBIEN) 10 MG tablet Take 10 mg by mouth at bedtime as needed for sleep.      No results found for this or any previous visit (from the past 48 hour(s)). No results found.  ROS: Pain with rom of the right lower extremity  Physical Exam: Alert and oriented 73 y.o. male in no acute distress Cranial nerves 2-12 intact Cervical spine: full rom with no tenderness, nv intact distally Chest: active breath sounds bilaterally, no wheeze rhonchi or rales Heart: regular rate and rhythm, no murmur Abd: non tender non distended with active bowel sounds Hip is stable with rom  Right knee with moderate medial and lateral joint line tenderness Antalgic gait No rashes  Strength good with hamstrings/quadriceps  Assessment/Plan Assessment: right knee end stage osteoarthritis  Plan:  Patient will undergo a right total knee by Dr. Veverly Fells at [facility]. Risks benefits and expectations were discussed with the patient. Patient understand risks, benefits and expectations and wishes to proceed. Preoperative templating of the joint replacement has been completed, documented, and submitted to the Operating Room personnel in order to optimize intra-operative equipment management.   Merla Riches PA-C, MPAS [facility] is now [employer] [address], [place], [place], [place] [address] Phone: [phone] [email or web address]  [employer]

## 2019-03-23 ENCOUNTER — Inpatient Hospital Stay (HOSPITAL_COMMUNITY): Admission: RE | Admit: 2019-03-23 | Payer: Medicare Other | Source: Ambulatory Visit

## 2019-03-23 ENCOUNTER — Encounter (HOSPITAL_COMMUNITY): Payer: Self-pay

## 2019-03-23 NOTE — Patient Instructions (Addendum)
DUE TO COVID-19 ONLY ONE VISITOR IS ALLOWED IN THE HOSPITAL AT THIS TIME   COVID SWAB TESTING MUST BE COMPLETED ON: March 23, 2019???  (Must self quarantine after testing. Follow instructions on handout.)   Your procedure is scheduled on: Friday, March 26, 2019   Report to [facility] Main  Entrance   Report to Short Stay at 5:30 AM   Call this number if you have problems the morning of surgery [phone]   Do not eat food:After Midnight.   May have liquids until 4:30AM day of surgery   CLEAR LIQUID DIET  Foods Allowed                                                                     Foods Excluded  Water, Black Coffee and tea, regular and decaf                             liquids that you cannot  Plain Jell-O in any flavor                                             see through such as: Fruit ices (not with fruit pulp)                                     milk, soups, orange juice  Iced Popsicles                                    All solid food Carbonated beverages, regular and diet                                    Cranberry, grape and apple juices Sports drinks like Gatorade Lightly seasoned clear broth or consume(fat free) Sugar, honey syrup  Sample Menu Breakfast                                Lunch                                     Supper Cranberry juice                    Beef broth                            Chicken broth Jell-O                                     Grape juice  Apple juice Coffee or tea                        Jell-O                                      Popsicle                                                Coffee or tea                        Coffee or tea   Complete one Ensure drink the morning of surgery at 4:30AM the day of surgery.   Brush your teeth the morning of surgery.no gum candy or mints   Do NOT smoke after Midnight   Take these medicines the morning of surgery with A SIP OF WATER: DILTIAZEM,  ATORVASTATIN                               You may not have any metal on your body including jewelry, and body piercings             Do not wear lotions, powders, perfumes/cologne, or deodorant                          Men may shave face and neck.   Do not bring valuables to the hospital. [place].   Contacts, dentures or bridgework may not be worn into surgery.   Bring small overnight bag day of surgery.              Please read over the following fact sheets you were given:  [facility] - Preparing for Surgery Before surgery, you can play an important role.  Because skin is not sterile, your skin needs to be as free of germs as possible.  You can reduce the number of germs on your skin by washing with CHG (chlorahexidine gluconate) soap before surgery.  CHG is an antiseptic cleaner which kills germs and bonds with the skin to continue killing germs even after washing. Please DO NOT use if you have an allergy to CHG or antibacterial soaps.  If your skin becomes reddened/irritated stop using the CHG and inform your nurse when you arrive at Short Stay. Do not shave (including legs and underarms) for at least 48 hours prior to the first CHG shower.  You may shave your face/neck.  Please follow these instructions carefully:  1.  Shower with CHG Soap the night before surgery and the  morning of surgery.  2.  If you choose to wash your hair, wash your hair first as usual with your normal  shampoo.  3.  After you shampoo, rinse your hair and body thoroughly to remove the shampoo.                             4.  Use CHG as you would any other liquid soap.  You can apply chg  directly to the skin and wash.  Gently with a scrungie or clean washcloth.  5.  Apply the CHG Soap to your body ONLY FROM THE NECK DOWN.   Do  not use on face/ open                           Wound or open sores. Avoid contact with eyes, ears mouth and genitals (private parts).                        Wash face,  Genitals (private parts) with your normal soap.             6.  Wash thoroughly, paying special attention to the area where your surgery  will be performed.  7.  Thoroughly rinse your body with warm water from the neck down.  8.  DO NOT shower/wash with your normal soap after using and rinsing off the CHG Soap.                9.  Pat yourself dry with a clean towel.            10.  Wear clean pajamas.            11.  Place clean sheets on your bed the night of your first shower and do not  sleep with pets. Day of Surgery : Do not apply any lotions/deodorants the morning of surgery.  Please wear clean clothes to the hospital/surgery center.  FAILURE TO FOLLOW THESE INSTRUCTIONS MAY RESULT IN THE CANCELLATION OF YOUR SURGERY  PATIENT SIGNATURE_________________________________  NURSE SIGNATURE__________________________________  ________________________________________________________________________   Alex Alvarez  An incentive spirometer is a tool that can help keep your lungs clear and active. This tool measures how well you are filling your lungs with each breath. Taking long deep breaths may help reverse or decrease the chance of developing breathing (pulmonary) problems (especially infection) following:  A long period of time when you are unable to move or be active. BEFORE THE PROCEDURE   If the spirometer includes an indicator to show your best effort, your nurse or respiratory therapist will set it to a desired goal.  If possible, sit up straight or lean slightly forward. Try not to slouch.  Hold the incentive spirometer in an upright position. INSTRUCTIONS FOR USE  1. Sit on the edge of your bed if possible, or sit up as far as you can in bed or on a chair. 2. Hold the incentive spirometer in an upright position. 3. Breathe out normally. 4. Place the mouthpiece in your mouth and seal your lips tightly around it. 5. Breathe in slowly  and as deeply as possible, raising the piston or the ball toward the top of the column. 6. Hold your breath for 3-5 seconds or for as long as possible. Allow the piston or ball to fall to the bottom of the column. 7. Remove the mouthpiece from your mouth and breathe out normally. 8. Rest for a few seconds and repeat Steps 1 through 7 at least 10 times every 1-2 hours when you are awake. Take your time and take a few normal breaths between deep breaths. 9. The spirometer may include an indicator to show your best effort. Use the indicator as a goal to work toward during each repetition. 10. After each set of 10 deep breaths, practice coughing to be sure your lungs are clear. If you have an  incision (the cut made at the time of surgery), support your incision when coughing by placing a pillow or rolled up towels firmly against it. Once you are able to get out of bed, walk around indoors and cough well. You may stop using the incentive spirometer when instructed by your caregiver.  RISKS AND COMPLICATIONS  Take your time so you do not get dizzy or light-headed.  If you are in pain, you may need to take or ask for pain medication before doing incentive spirometry. It is harder to take a deep breath if you are having pain. AFTER USE  Rest and breathe slowly and easily.  It can be helpful to keep track of a log of your progress. Your caregiver can provide you with a simple table to help with this. If you are using the spirometer at home, follow these instructions: [place] IF:   You are having difficultly using the spirometer.  You have trouble using the spirometer as often as instructed.  Your pain medication is not giving enough relief while using the spirometer.  You develop fever of 100.5 F (38.1 C) or higher. SEEK IMMEDIATE MEDICAL CARE IF:   You cough up bloody sputum that had not been present before.  You develop fever of 102 F (38.9 C) or greater.  You develop  worsening pain at or near the incision site. MAKE SURE YOU:   Understand these instructions.  Will watch your condition.  Will get help right away if you are not doing well or get worse. Document Released: 12/30/2006 Document Revised: 11/11/2011 Document Reviewed: 03/02/2007 [facility] Patient Information 2014 [place], [place].   ________________________________________________________________________

## 2019-03-23 NOTE — Pre-Procedure Instructions (Signed)
Cardiac clearance and last office visit note K. Lawrence NP 03/02/2019 in epic.

## 2019-03-24 ENCOUNTER — Other Ambulatory Visit: Payer: Self-pay

## 2019-03-24 ENCOUNTER — Other Ambulatory Visit (HOSPITAL_COMMUNITY)
Admission: RE | Admit: 2019-03-24 | Discharge: 2019-03-24 | Disposition: A | Discharge status: Home / Self Care | Payer: Medicare Other | Source: Ambulatory Visit | Attending: Orthopedic Surgery | Admitting: Orthopedic Surgery

## 2019-03-24 ENCOUNTER — Encounter (HOSPITAL_COMMUNITY)
Admission: RE | Admit: 2019-03-24 | Discharge: 2019-03-24 | Disposition: A | Discharge status: Home / Self Care | Payer: Medicare Other | Source: Ambulatory Visit | Attending: Orthopedic Surgery | Admitting: Orthopedic Surgery

## 2019-03-24 ENCOUNTER — Encounter (HOSPITAL_COMMUNITY): Payer: Self-pay

## 2019-03-24 DIAGNOSIS — I482 Chronic atrial fibrillation, unspecified: Secondary | ICD-10-CM | POA: Diagnosis not present

## 2019-03-24 DIAGNOSIS — Z79899 Other long term (current) drug therapy: Secondary | ICD-10-CM | POA: Diagnosis not present

## 2019-03-24 DIAGNOSIS — I10 Essential (primary) hypertension: Secondary | ICD-10-CM | POA: Diagnosis not present

## 2019-03-24 DIAGNOSIS — Z7982 Long term (current) use of aspirin: Secondary | ICD-10-CM | POA: Diagnosis not present

## 2019-03-24 DIAGNOSIS — Z96652 Presence of left artificial knee joint: Secondary | ICD-10-CM | POA: Diagnosis not present

## 2019-03-24 DIAGNOSIS — Z87891 Personal history of nicotine dependence: Secondary | ICD-10-CM | POA: Diagnosis not present

## 2019-03-24 DIAGNOSIS — Z7901 Long term (current) use of anticoagulants: Secondary | ICD-10-CM | POA: Diagnosis not present

## 2019-03-24 DIAGNOSIS — M25561 Pain in right knee: Secondary | ICD-10-CM | POA: Diagnosis present

## 2019-03-24 DIAGNOSIS — Z01818 Encounter for other preprocedural examination: Secondary | ICD-10-CM | POA: Insufficient documentation

## 2019-03-24 DIAGNOSIS — I4891 Unspecified atrial fibrillation: Secondary | ICD-10-CM | POA: Insufficient documentation

## 2019-03-24 DIAGNOSIS — Z20828 Contact with and (suspected) exposure to other viral communicable diseases: Secondary | ICD-10-CM | POA: Diagnosis not present

## 2019-03-24 DIAGNOSIS — Z85828 Personal history of other malignant neoplasm of skin: Secondary | ICD-10-CM | POA: Diagnosis not present

## 2019-03-24 DIAGNOSIS — M25761 Osteophyte, right knee: Secondary | ICD-10-CM | POA: Diagnosis not present

## 2019-03-24 DIAGNOSIS — Z85818 Personal history of malignant neoplasm of other sites of lip, oral cavity, and pharynx: Secondary | ICD-10-CM | POA: Diagnosis not present

## 2019-03-24 DIAGNOSIS — M1711 Unilateral primary osteoarthritis, right knee: Secondary | ICD-10-CM | POA: Insufficient documentation

## 2019-03-24 DIAGNOSIS — Z1159 Encounter for screening for other viral diseases: Secondary | ICD-10-CM | POA: Insufficient documentation

## 2019-03-24 DIAGNOSIS — Z791 Long term (current) use of non-steroidal anti-inflammatories (NSAID): Secondary | ICD-10-CM | POA: Diagnosis not present

## 2019-03-24 DIAGNOSIS — R9431 Abnormal electrocardiogram [ECG] [EKG]: Secondary | ICD-10-CM | POA: Insufficient documentation

## 2019-03-24 HISTORY — DX: Other specified disorders of veins: I87.8

## 2019-03-24 HISTORY — DX: Psychophysiologic insomnia: F51.04

## 2019-03-24 HISTORY — DX: Hyperlipidemia, unspecified: E78.5

## 2019-03-24 HISTORY — DX: Localized edema: R60.0

## 2019-03-24 HISTORY — DX: Disorders of diaphragm: J98.6

## 2019-03-24 LAB — CBC
HCT: 48.1 % (ref 39.0–52.0)
Hemoglobin: 15 g/dL (ref 13.0–17.0)
MCH: 32 pg (ref 26.0–34.0)
MCHC: 31.2 g/dL (ref 30.0–36.0)
MCV: 102.6 fL — ABNORMAL HIGH (ref 80.0–100.0)
Platelets: 152 10*3/uL (ref 150–400)
RBC: 4.69 MIL/uL (ref 4.22–5.81)
RDW: 11.9 % (ref 11.5–15.5)
WBC: 6 10*3/uL (ref 4.0–10.5)
nRBC: 0 % (ref 0.0–0.2)

## 2019-03-24 LAB — BASIC METABOLIC PANEL
Anion gap: 8 (ref 5–15)
BUN: 18 mg/dL (ref 8–23)
CO2: 28 mmol/L (ref 22–32)
Calcium: 8.9 mg/dL (ref 8.9–10.3)
Chloride: 104 mmol/L (ref 98–111)
Creatinine, Ser: 1.25 mg/dL — ABNORMAL HIGH (ref 0.61–1.24)
GFR calc Af Amer: >60 mL/min (ref >60)
GFR calc non Af Amer: 57 mL/min — ABNORMAL LOW (ref >60)
Glucose, Bld: 135 mg/dL — ABNORMAL HIGH (ref 70–99)
Potassium: 4.4 mmol/L (ref 3.5–5.1)
Sodium: 140 mmol/L (ref 135–145)

## 2019-03-24 LAB — SURGICAL PCR SCREEN
MRSA, PCR: NEGATIVE
Staphylococcus aureus: NEGATIVE

## 2019-03-24 LAB — SARS CORONAVIRUS 2 (TAT 6-24 HRS): SARS Coronavirus 2: NEGATIVE

## 2019-03-25 NOTE — Anesthesia Preprocedure Evaluation (Addendum)
Anesthesia Evaluation  Patient identified by MRN, date of birth, ID band Patient awake    Reviewed: Allergy & Precautions, NPO status , Patient's Chart, lab work & pertinent test results  Airway Mallampati: II  TM Distance: >3 FB Neck ROM: Full    Dental no notable dental hx.    Pulmonary neg pulmonary ROS, former smoker,    Pulmonary exam normal breath sounds clear to auscultation       Cardiovascular hypertension, Pt. on medications negative cardio ROS Normal cardiovascular exam+ dysrhythmias Atrial Fibrillation  Rhythm:Regular Rate:Normal     Neuro/Psych negative neurological ROS  negative psych ROS   GI/Hepatic negative GI ROS, Neg liver ROS,   Endo/Other  negative endocrine ROS  Renal/GU negative Renal ROS  negative genitourinary   Musculoskeletal negative musculoskeletal ROS (+)   Abdominal   Peds negative pediatric ROS (+)  Hematology negative hematology ROS (+)   Anesthesia Other Findings   Reproductive/Obstetrics negative OB ROS                            Anesthesia Physical Anesthesia Plan  ASA: II  Anesthesia Plan: Spinal   Post-op Pain Management:  Regional for Post-op pain   Induction:   PONV Risk Score and Plan: 1 and Treatment may vary due to age or medical condition  Airway Management Planned: Simple Face Mask  Additional Equipment:   Intra-op Plan:   Post-operative Plan:   Informed Consent: I have reviewed the patients History and Physical, chart, labs and discussed the procedure including the risks, benefits and alternatives for the proposed anesthesia with the patient or authorized representative who has indicated his/her understanding and acceptance.     Dental advisory given  Plan Discussed with:   Anesthesia Plan Comments: (See PAT note 03/24/2019, Konrad Felix, PA-C)       Anesthesia Quick Evaluation

## 2019-03-25 NOTE — Progress Notes (Signed)
Anesthesia Chart Review   Case: [id number] Date/Time: 03/26/19 0715   Procedure: TOTAL KNEE ARTHROPLASTY (Right )   Anesthesia type: Spinal   Pre-op diagnosis: Right knee end stage osteoarthritis   Location: [facility] ROOM 06 / [facility]   Surgeon: Netta Cedars, MD      DISCUSSION:72 y.o. former smoker (quit 10/24/11) with h/o HTN, A-fib (on Eliquis), right knee end stage OA scheduled for above procedure 03/26/2019 with Dr. Netta Cedars.   Pt last seen by cardiology 03/02/2019.  Seen by Jory Sims, NP.  Per OV note, "Given past medical history and time since last visit, based on ACC/AHA guidelines, Alex Alvarez would be at acceptable risk for the planned procedure without further cardiovascular testing.  He will need to hold Eliquis 3 days prior to surgery, and begin the following day after surgery is complete.  This is also explained to the patient on this visit."  Elevated BP at PAT visit.  Pt asymptomatic at this time.  At last PCP visit 01/08/2019 120/78.  Surgeon made aware.  Will evaluate DOS.  VS: BP (!) 155/116 (BP Location: Left Arm)   Pulse 85   Temp 36.9 C (Oral)   Resp 18   Ht 5\' 11"  (1.803 m)   Wt 107.5 kg   SpO2 96%   BMI 33.05 kg/m   PROVIDERS: Christain Sacramento, MD is PCP   Ellyn Hack, MD is Cardiologist  LABS: Labs reviewed: Acceptable for surgery. (all labs ordered are listed, but only abnormal results are displayed)  Labs Reviewed  CBC - Abnormal; Notable for the following components:      Result Value   MCV 102.6 (*)    All other components within normal limits  BASIC METABOLIC PANEL - Abnormal; Notable for the following components:   Glucose, Bld 135 (*)    Creatinine, Ser 1.25 (*)    GFR calc non Af Amer 57 (*)    All other components within normal limits  SURGICAL PCR SCREEN     IMAGES:   EKG: 03/24/2019 Rate 81 bpm Atrial fibrillation Low voltage QRS Lateral infarct , age undetermined Abnormal ECG Probable limb lead reversal New since previous  tracing  CV: Echocardiogram 11/13/2018 Left ventricle: The cavity size was normal. Systolic  function was normal. The estimated ejection fraction was  in the range of 50% to 55%. Regional wall motion  abnormalities cannot be excluded. The study is not  technically sufficient to allow evaluation of LV diastolic  function.  - Aortic valve: Trivial regurgitation.  - Mitral valve: Mild regurgitation.  - Atrial septum: No defect or patent foramen ovale was  identified.  Past Medical History:  Diagnosis Date  . Bilateral lower extremity edema   . 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 Chronic atrial fibrillation 8/132014 office note   chronic afib dates back to June 2012; CHADS2-VASc score 2; candidate for dual  antiplatelet therapy based on active C and active S STUDIES  for thromboembolic prophyaxis--actually ot on coumadin but on aspirin ad plavix  . Chronic insomnia   . Chronically elevated hemidiaphragm    Left  . Dyslipidemia   . Dysrhythmia   . Former light tobacco smoker     smoked a pipe; quit 25+ years ago   . Hypertension   . Obesity (BMI 30.0-34.9)   . 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 Venous stasis of both lower extremities  with edema    Past Surgical History:  Procedure Laterality Date  . Aorta doppler  11/12/2012   normal abdomnal aortic duplex   . JOINT REPLACEMENT Left Jul 07, 2015  . NM MYOVIEW LTD  07/30/2011   non-gated study seconadry to a fib;exercise capcity 7 mets--mildly reduced;low risk scan  . SKIN BIOPSY N/A sept 2016   Basal cell from face.  . TONSILLECTOMY    . TOTAL KNEE ARTHROPLASTY Left 07/07/2015   Procedure: LEFT TOTAL KNEE ARTHROPLASTY;  Surgeon: Netta Cedars, MD;  Location: [facility];  Service: Orthopedics;  Laterality: Left;  . TRANSTHORACIC ECHOCARDIOGRAM  02/22/2011   EF 50-55%; with mild inferior wall hypokinesis, mild left atrial enlargement of 4.2cm   . TRANSTHORACIC ECHOCARDIOGRAM  11/12/2012   afib; EF 50-55% (CRO WMA b/c Afib - difficult gaiting). AoV sclerosis without stenosis    MEDICATIONS: . apixaban (ELIQUIS) 5 MG TABS tablet  . aspirin EC 81 MG tablet  . atorvastatin (LIPITOR) 10 MG tablet  . carboxymethylcellulose (REFRESH PLUS) 0.5 % SOLN  . diclofenac (VOLTAREN) 75 MG EC tablet  . diltiazem (CARDIZEM CD) 240 MG 24 hr capsule  . EPINEPHrine (EPIPEN 2-PAK) 0.3 mg/0.3 mL IJ SOAJ injection  . irbesartan-hydrochlorothiazide (AVALIDE) 300-12.5 MG tablet  . Multiple Vitamins-Minerals (MULTIVITAMIN WITH MINERALS) tablet  . spironolactone (ALDACTONE) 25 MG tablet  . zolpidem (AMBIEN) 10 MG tablet   No current facility-administered medications for this encounter.    Maia Plan [facility] Pre-Surgical Testing [phone] 03/25/19 9:14 AM

## 2019-03-26 ENCOUNTER — Encounter (HOSPITAL_COMMUNITY): Payer: Self-pay | Admitting: *Deleted

## 2019-03-26 ENCOUNTER — Observation Stay (HOSPITAL_COMMUNITY)
Admission: AD | Admit: 2019-03-26 | Discharge: 2019-03-28 | Disposition: A | Discharge status: Home / Self Care | Payer: Medicare Other | Attending: Orthopedic Surgery | Admitting: Orthopedic Surgery

## 2019-03-26 ENCOUNTER — Inpatient Hospital Stay (HOSPITAL_COMMUNITY): Payer: Medicare Other

## 2019-03-26 ENCOUNTER — Ambulatory Visit (HOSPITAL_COMMUNITY): Payer: Medicare Other | Admitting: Certified Registered"

## 2019-03-26 ENCOUNTER — Ambulatory Visit (HOSPITAL_COMMUNITY): Payer: Medicare Other | Admitting: Physician Assistant

## 2019-03-26 ENCOUNTER — Encounter (HOSPITAL_COMMUNITY)
Admission: AD | Disposition: A | Discharge status: Home / Self Care | Payer: Self-pay | Source: Home / Self Care | Attending: Orthopedic Surgery

## 2019-03-26 ENCOUNTER — Other Ambulatory Visit: Payer: Self-pay

## 2019-03-26 DIAGNOSIS — Z96659 Presence of unspecified artificial knee joint: Secondary | ICD-10-CM

## 2019-03-26 DIAGNOSIS — Z85828 Personal history of other malignant neoplasm of skin: Secondary | ICD-10-CM | POA: Diagnosis not present

## 2019-03-26 DIAGNOSIS — Z96652 Presence of left artificial knee joint: Secondary | ICD-10-CM | POA: Insufficient documentation

## 2019-03-26 DIAGNOSIS — Z7982 Long term (current) use of aspirin: Secondary | ICD-10-CM | POA: Insufficient documentation

## 2019-03-26 DIAGNOSIS — M1711 Unilateral primary osteoarthritis, right knee: Secondary | ICD-10-CM | POA: Diagnosis not present

## 2019-03-26 DIAGNOSIS — Z85818 Personal history of malignant neoplasm of other sites of lip, oral cavity, and pharynx: Secondary | ICD-10-CM | POA: Diagnosis not present

## 2019-03-26 DIAGNOSIS — Z79899 Other long term (current) drug therapy: Secondary | ICD-10-CM | POA: Insufficient documentation

## 2019-03-26 DIAGNOSIS — Z7901 Long term (current) use of anticoagulants: Secondary | ICD-10-CM | POA: Insufficient documentation

## 2019-03-26 DIAGNOSIS — Z791 Long term (current) use of non-steroidal anti-inflammatories (NSAID): Secondary | ICD-10-CM | POA: Insufficient documentation

## 2019-03-26 DIAGNOSIS — M25761 Osteophyte, right knee: Secondary | ICD-10-CM | POA: Diagnosis not present

## 2019-03-26 DIAGNOSIS — Z96651 Presence of right artificial knee joint: Secondary | ICD-10-CM

## 2019-03-26 DIAGNOSIS — Z20828 Contact with and (suspected) exposure to other viral communicable diseases: Secondary | ICD-10-CM | POA: Insufficient documentation

## 2019-03-26 DIAGNOSIS — I482 Chronic atrial fibrillation, unspecified: Secondary | ICD-10-CM | POA: Insufficient documentation

## 2019-03-26 DIAGNOSIS — I10 Essential (primary) hypertension: Secondary | ICD-10-CM | POA: Insufficient documentation

## 2019-03-26 DIAGNOSIS — Z87891 Personal history of nicotine dependence: Secondary | ICD-10-CM | POA: Insufficient documentation

## 2019-03-26 HISTORY — PX: TOTAL KNEE ARTHROPLASTY: SHX125

## 2019-03-26 SURGERY — ARTHROPLASTY, KNEE, TOTAL
Anesthesia: Spinal | Site: Knee | Laterality: Right

## 2019-03-26 MED ORDER — METHOCARBAMOL 500 MG PO TABS
500.0000 mg | ORAL_TABLET | Freq: Four times a day (QID) | ORAL | Status: DC | PRN
  Administered 2019-03-26 – 2019-03-28 (×5): 500 mg via ORAL
  Filled 2019-03-26 (×5): qty 1

## 2019-03-26 MED ORDER — IRBESARTAN 150 MG PO TABS
300.0000 mg | ORAL_TABLET | Freq: Every day | ORAL | Status: DC
  Administered 2019-03-27 – 2019-03-28 (×2): 300 mg via ORAL
  Filled 2019-03-26 (×2): qty 2

## 2019-03-26 MED ORDER — CEFAZOLIN SODIUM-DEXTROSE 2-4 GM/100ML-% IV SOLN
2.0000 g | Freq: Four times a day (QID) | INTRAVENOUS | Status: AC
  Administered 2019-03-26 (×2): 2 g via INTRAVENOUS
  Filled 2019-03-26 (×2): qty 100

## 2019-03-26 MED ORDER — FENTANYL CITRATE (PF) 100 MCG/2ML IJ SOLN
INTRAMUSCULAR | Status: AC
  Filled 2019-03-26: qty 2

## 2019-03-26 MED ORDER — TRANEXAMIC ACID-NACL 1000-0.7 MG/100ML-% IV SOLN
1000.0000 mg | Freq: Once | INTRAVENOUS | Status: AC
  Administered 2019-03-26: 1000 mg via INTRAVENOUS
  Filled 2019-03-26: qty 100

## 2019-03-26 MED ORDER — OXYCODONE HCL 5 MG PO TABS
5.0000 mg | ORAL_TABLET | ORAL | Status: DC | PRN
  Administered 2019-03-26 – 2019-03-27 (×5): 10 mg via ORAL
  Administered 2019-03-28 (×2): 5 mg via ORAL
  Filled 2019-03-26 (×5): qty 2
  Filled 2019-03-26 (×2): qty 1

## 2019-03-26 MED ORDER — MIDAZOLAM HCL 2 MG/2ML IJ SOLN
INTRAMUSCULAR | Status: AC
  Filled 2019-03-26: qty 2

## 2019-03-26 MED ORDER — OXYCODONE-ACETAMINOPHEN 5-325 MG PO TABS: 1.0000 | ORAL_TABLET | ORAL | 0 refills | Status: DC | PRN

## 2019-03-26 MED ORDER — CHLORHEXIDINE GLUCONATE 4 % EX LIQD: 60.0000 mL | Freq: Once | CUTANEOUS | Status: DC

## 2019-03-26 MED ORDER — WATER FOR IRRIGATION, STERILE IR SOLN
Status: DC | PRN
  Administered 2019-03-26: 2000 mL

## 2019-03-26 MED ORDER — TRAMADOL HCL 50 MG PO TABS
50.0000 mg | ORAL_TABLET | Freq: Four times a day (QID) | ORAL | Status: DC | PRN
  Administered 2019-03-26: 100 mg via ORAL
  Filled 2019-03-26: qty 2

## 2019-03-26 MED ORDER — ATORVASTATIN CALCIUM 10 MG PO TABS
10.0000 mg | ORAL_TABLET | Freq: Every day | ORAL | Status: DC
  Administered 2019-03-26 – 2019-03-28 (×3): 10 mg via ORAL
  Filled 2019-03-26 (×3): qty 1

## 2019-03-26 MED ORDER — ONDANSETRON HCL 4 MG PO TABS: 4.0000 mg | ORAL_TABLET | Freq: Four times a day (QID) | ORAL | Status: DC | PRN

## 2019-03-26 MED ORDER — IRBESARTAN-HYDROCHLOROTHIAZIDE 300-12.5 MG PO TABS: 1.0000 | ORAL_TABLET | Freq: Every day | ORAL | Status: DC

## 2019-03-26 MED ORDER — METOCLOPRAMIDE HCL 5 MG/ML IJ SOLN: 10.0000 mg | Freq: Once | INTRAMUSCULAR | Status: DC | PRN

## 2019-03-26 MED ORDER — SODIUM CHLORIDE 0.9 % IR SOLN
Status: DC | PRN
  Administered 2019-03-26: 3000 mL

## 2019-03-26 MED ORDER — MIDAZOLAM HCL 2 MG/2ML IJ SOLN
INTRAMUSCULAR | Status: DC | PRN
  Administered 2019-03-26 (×2): 1 mg via INTRAVENOUS

## 2019-03-26 MED ORDER — ONDANSETRON HCL 4 MG/2ML IJ SOLN
INTRAMUSCULAR | Status: AC
  Filled 2019-03-26: qty 2

## 2019-03-26 MED ORDER — MENTHOL 3 MG MT LOZG: 1.0000 | LOZENGE | OROMUCOSAL | Status: DC | PRN

## 2019-03-26 MED ORDER — CEFAZOLIN SODIUM-DEXTROSE 2-4 GM/100ML-% IV SOLN
2.0000 g | INTRAVENOUS | Status: AC
  Administered 2019-03-26: 2 g via INTRAVENOUS
  Filled 2019-03-26: qty 100

## 2019-03-26 MED ORDER — ACETAMINOPHEN 325 MG PO TABS
325.0000 mg | ORAL_TABLET | Freq: Four times a day (QID) | ORAL | Status: DC | PRN
  Administered 2019-03-27: 650 mg via ORAL
  Filled 2019-03-26: qty 2

## 2019-03-26 MED ORDER — LACTATED RINGERS IV SOLN
INTRAVENOUS | Status: DC
  Administered 2019-03-26: 06:00:00 via INTRAVENOUS

## 2019-03-26 MED ORDER — HYDROMORPHONE HCL 1 MG/ML IJ SOLN
0.5000 mg | INTRAMUSCULAR | Status: DC | PRN
  Administered 2019-03-26: 0.5 mg via INTRAVENOUS
  Filled 2019-03-26: qty 0.5

## 2019-03-26 MED ORDER — FERROUS SULFATE 325 (65 FE) MG PO TABS
325.0000 mg | ORAL_TABLET | Freq: Three times a day (TID) | ORAL | Status: DC
  Administered 2019-03-27 (×3): 325 mg via ORAL
  Filled 2019-03-26 (×4): qty 1

## 2019-03-26 MED ORDER — FENTANYL CITRATE (PF) 100 MCG/2ML IJ SOLN
INTRAMUSCULAR | Status: DC | PRN
  Administered 2019-03-26 (×2): 25 ug via INTRAVENOUS

## 2019-03-26 MED ORDER — METHOCARBAMOL 500 MG IVPB - SIMPLE MED
INTRAVENOUS | Status: AC
  Administered 2019-03-26: 500 mg via INTRAVENOUS
  Filled 2019-03-26: qty 50

## 2019-03-26 MED ORDER — ONDANSETRON HCL 4 MG/2ML IJ SOLN
INTRAMUSCULAR | Status: DC | PRN
  Administered 2019-03-26: 4 mg via INTRAVENOUS

## 2019-03-26 MED ORDER — DEXAMETHASONE SODIUM PHOSPHATE 10 MG/ML IJ SOLN
INTRAMUSCULAR | Status: AC
  Filled 2019-03-26: qty 1

## 2019-03-26 MED ORDER — POLYETHYLENE GLYCOL 3350 17 G PO PACK
17.0000 g | PACK | Freq: Every day | ORAL | Status: DC | PRN
  Administered 2019-03-28: 17 g via ORAL
  Filled 2019-03-26: qty 1

## 2019-03-26 MED ORDER — PHENOL 1.4 % MT LIQD: 1.0000 | OROMUCOSAL | Status: DC | PRN

## 2019-03-26 MED ORDER — BISACODYL 10 MG RE SUPP: 10.0000 mg | Freq: Every day | RECTAL | Status: DC | PRN

## 2019-03-26 MED ORDER — ASPIRIN EC 81 MG PO TBEC
81.0000 mg | DELAYED_RELEASE_TABLET | Freq: Every day | ORAL | Status: DC
  Administered 2019-03-26 – 2019-03-28 (×3): 81 mg via ORAL
  Filled 2019-03-26 (×3): qty 1

## 2019-03-26 MED ORDER — PROPOFOL 10 MG/ML IV BOLUS
INTRAVENOUS | Status: AC
  Filled 2019-03-26: qty 60

## 2019-03-26 MED ORDER — METHOCARBAMOL 500 MG IVPB - SIMPLE MED
500.0000 mg | Freq: Four times a day (QID) | INTRAVENOUS | Status: DC | PRN
  Administered 2019-03-26: 500 mg via INTRAVENOUS
  Filled 2019-03-26: qty 50

## 2019-03-26 MED ORDER — SODIUM CHLORIDE 0.9 % IV SOLN
INTRAVENOUS | Status: DC
  Administered 2019-03-26 – 2019-03-27 (×2): via INTRAVENOUS

## 2019-03-26 MED ORDER — DEXAMETHASONE SODIUM PHOSPHATE 10 MG/ML IJ SOLN
INTRAMUSCULAR | Status: DC | PRN
  Administered 2019-03-26: 8 mg via INTRAVENOUS

## 2019-03-26 MED ORDER — DICLOFENAC SODIUM 75 MG PO TBEC
75.0000 mg | DELAYED_RELEASE_TABLET | Freq: Two times a day (BID) | ORAL | Status: DC
  Administered 2019-03-26 – 2019-03-28 (×4): 75 mg via ORAL
  Filled 2019-03-26 (×4): qty 1

## 2019-03-26 MED ORDER — PROPOFOL 10 MG/ML IV BOLUS
INTRAVENOUS | Status: AC
  Filled 2019-03-26: qty 20

## 2019-03-26 MED ORDER — SPIRONOLACTONE 12.5 MG HALF TABLET
12.5000 mg | ORAL_TABLET | Freq: Every day | ORAL | Status: DC
  Administered 2019-03-27: 12.5 mg via ORAL
  Filled 2019-03-26: qty 1

## 2019-03-26 MED ORDER — DILTIAZEM HCL ER COATED BEADS 240 MG PO CP24
240.0000 mg | ORAL_CAPSULE | Freq: Every day | ORAL | Status: DC
  Administered 2019-03-27 – 2019-03-28 (×2): 240 mg via ORAL
  Filled 2019-03-26 (×2): qty 1

## 2019-03-26 MED ORDER — HYDROCHLOROTHIAZIDE 12.5 MG PO CAPS
12.5000 mg | ORAL_CAPSULE | Freq: Every day | ORAL | Status: DC
  Administered 2019-03-27 – 2019-03-28 (×2): 12.5 mg via ORAL
  Filled 2019-03-26 (×2): qty 1

## 2019-03-26 MED ORDER — ADULT MULTIVITAMIN W/MINERALS CH
1.0000 | ORAL_TABLET | Freq: Every day | ORAL | Status: DC
  Administered 2019-03-27 – 2019-03-28 (×2): 1 via ORAL
  Filled 2019-03-26 (×2): qty 1

## 2019-03-26 MED ORDER — FENTANYL CITRATE (PF) 100 MCG/2ML IJ SOLN: 25.0000 ug | INTRAMUSCULAR | Status: DC | PRN

## 2019-03-26 MED ORDER — DOCUSATE SODIUM 100 MG PO CAPS
100.0000 mg | ORAL_CAPSULE | Freq: Two times a day (BID) | ORAL | Status: DC
  Administered 2019-03-26 – 2019-03-28 (×4): 100 mg via ORAL
  Filled 2019-03-26 (×4): qty 1

## 2019-03-26 MED ORDER — POLYVINYL ALCOHOL 1.4 % OP SOLN: 1.0000 [drp] | Freq: Two times a day (BID) | OPHTHALMIC | Status: DC | PRN

## 2019-03-26 MED ORDER — METOCLOPRAMIDE HCL 5 MG/ML IJ SOLN: 5.0000 mg | Freq: Three times a day (TID) | INTRAMUSCULAR | Status: DC | PRN

## 2019-03-26 MED ORDER — ONDANSETRON HCL 4 MG/2ML IJ SOLN: 4.0000 mg | Freq: Four times a day (QID) | INTRAMUSCULAR | Status: DC | PRN

## 2019-03-26 MED ORDER — 0.9 % SODIUM CHLORIDE (POUR BTL) OPTIME
TOPICAL | Status: DC | PRN
  Administered 2019-03-26: 08:00:00 1000 mL

## 2019-03-26 MED ORDER — PROPOFOL 500 MG/50ML IV EMUL
INTRAVENOUS | Status: DC | PRN
  Administered 2019-03-26: 50 ug/kg/min via INTRAVENOUS

## 2019-03-26 MED ORDER — LIDOCAINE 2% (20 MG/ML) 5 ML SYRINGE
INTRAMUSCULAR | Status: AC
  Filled 2019-03-26: qty 5

## 2019-03-26 MED ORDER — METHOCARBAMOL 500 MG PO TABS: 500.0000 mg | ORAL_TABLET | Freq: Three times a day (TID) | ORAL | 1 refills | Status: DC | PRN

## 2019-03-26 MED ORDER — TRANEXAMIC ACID-NACL 1000-0.7 MG/100ML-% IV SOLN
1000.0000 mg | INTRAVENOUS | Status: AC
  Administered 2019-03-26: 1000 mg via INTRAVENOUS
  Filled 2019-03-26: qty 100

## 2019-03-26 MED ORDER — MEPERIDINE HCL 50 MG/ML IJ SOLN: 6.2500 mg | INTRAMUSCULAR | Status: DC | PRN

## 2019-03-26 MED ORDER — METOCLOPRAMIDE HCL 5 MG PO TABS: 5.0000 mg | ORAL_TABLET | Freq: Three times a day (TID) | ORAL | Status: DC | PRN

## 2019-03-26 MED ORDER — APIXABAN 5 MG PO TABS
5.0000 mg | ORAL_TABLET | Freq: Two times a day (BID) | ORAL | Status: DC
  Administered 2019-03-27 – 2019-03-28 (×3): 5 mg via ORAL
  Filled 2019-03-26 (×3): qty 1

## 2019-03-26 MED ORDER — ROPIVACAINE HCL 5 MG/ML IJ SOLN
INTRAMUSCULAR | Status: DC | PRN
  Administered 2019-03-26: 30 mL via PERINEURAL

## 2019-03-26 MED ORDER — TRAMADOL HCL 50 MG PO TABS: 50.0000 mg | ORAL_TABLET | Freq: Four times a day (QID) | ORAL | 0 refills | Status: DC | PRN

## 2019-03-26 SURGICAL SUPPLY — 54 items
BAG SPEC THK2 15X12 ZIP CLS (MISCELLANEOUS) ×1
BAG ZIPLOCK 12X15 (MISCELLANEOUS) ×2 IMPLANT
BLADE SAG 18X100X1.27 (BLADE) ×2 IMPLANT
BLADE SAW SGTL 13X75X1.27 (BLADE) ×2 IMPLANT
BNDG CMPR MED 10X6 ELC LF (GAUZE/BANDAGES/DRESSINGS) ×1
BNDG ELASTIC 6X10 VLCR STRL LF (GAUZE/BANDAGES/DRESSINGS) ×2 IMPLANT
BNDG GAUZE ELAST 4 BULKY (GAUZE/BANDAGES/DRESSINGS) ×2 IMPLANT
BOWL SMART MIX CTS (DISPOSABLE) ×2 IMPLANT
CEMENT HV SMART SET (Cement) ×4 IMPLANT
CEMENT TIBIA MBT SIZE 5 (Knees) IMPLANT
COVER SURGICAL LIGHT HANDLE (MISCELLANEOUS) ×2 IMPLANT
COVER WAND RF STERILE (DRAPES) IMPLANT
CUFF TOURN SGL QUICK 34 (TOURNIQUET CUFF) ×2
CUFF TOURN SGL QUICK 42 (TOURNIQUET CUFF) ×2 IMPLANT
CUFF TRNQT CYL 34X4.125X (TOURNIQUET CUFF) ×1 IMPLANT
DRAPE SHEET LG 3/4 BI-LAMINATE (DRAPES) ×2 IMPLANT
DRAPE U-SHAPE 47X51 STRL (DRAPES) ×2 IMPLANT
DRSG ADAPTIC 3X8 NADH LF (GAUZE/BANDAGES/DRESSINGS) ×2 IMPLANT
DRSG PAD ABDOMINAL 8X10 ST (GAUZE/BANDAGES/DRESSINGS) ×2 IMPLANT
DURAPREP 26ML APPLICATOR (WOUND CARE) ×2 IMPLANT
ELECT REM PT RETURN 15FT ADLT (MISCELLANEOUS) ×2 IMPLANT
FEMUR SIGMA PS SZ 5.0 R (Femur) ×1 IMPLANT
GAUZE SPONGE 4X4 12PLY STRL (GAUZE/BANDAGES/DRESSINGS) ×2 IMPLANT
GLOVE BIOGEL PI ORTHO PRO 7.5 (GLOVE) ×1
GLOVE BIOGEL PI ORTHO PRO SZ8 (GLOVE) ×1
GLOVE ORTHO TXT STRL SZ7.5 (GLOVE) ×2 IMPLANT
GLOVE PI ORTHO PRO STRL 7.5 (GLOVE) ×1 IMPLANT
GLOVE PI ORTHO PRO STRL SZ8 (GLOVE) ×1 IMPLANT
GLOVE SURG ORTHO 8.5 STRL (GLOVE) ×4 IMPLANT
GOWN STRL REIN XL XLG (GOWN DISPOSABLE) ×4 IMPLANT
HANDPIECE INTERPULSE COAX TIP (DISPOSABLE) ×2
HOLDER FOLEY CATH W/STRAP (MISCELLANEOUS) IMPLANT
IMMOBILIZER KNEE 20 (SOFTGOODS) ×2
IMMOBILIZER KNEE 20 THIGH 36 (SOFTGOODS) IMPLANT
KIT TURNOVER KIT A (KITS) IMPLANT
MANIFOLD NEPTUNE II (INSTRUMENTS) ×2 IMPLANT
NS IRRIG 1000ML POUR BTL (IV SOLUTION) ×2 IMPLANT
PACK TOTAL KNEE CUSTOM (KITS) ×2 IMPLANT
PATELLA DOME PFC 38MM (Knees) ×1 IMPLANT
PIN STEINMAN FIXATION KNEE (PIN) ×1 IMPLANT
PLATE ROT INSERT 12.5MM (Plate) ×1 IMPLANT
PROTECTOR NERVE ULNAR (MISCELLANEOUS) ×2 IMPLANT
SET HNDPC FAN SPRY TIP SCT (DISPOSABLE) ×1 IMPLANT
STAPLER VISISTAT 35W (STAPLE) IMPLANT
STRIP CLOSURE SKIN 1/2X4 (GAUZE/BANDAGES/DRESSINGS) ×4 IMPLANT
SUT MNCRL AB 3-0 PS2 18 (SUTURE) ×2 IMPLANT
SUT VIC AB 0 CT1 36 (SUTURE) ×2 IMPLANT
SUT VIC AB 1 CT1 36 (SUTURE) ×6 IMPLANT
SUT VIC AB 2-0 CT1 27 (SUTURE) ×2
SUT VIC AB 2-0 CT1 TAPERPNT 27 (SUTURE) ×1 IMPLANT
TIBIA MBT CEMENT SIZE 5 (Knees) ×2 IMPLANT
TRAY FOLEY MTR SLVR 16FR STAT (SET/KITS/TRAYS/PACK) ×2 IMPLANT
WATER STERILE IRR 1000ML POUR (IV SOLUTION) ×4 IMPLANT
YANKAUER SUCT BULB TIP 10FT TU (MISCELLANEOUS) ×2 IMPLANT

## 2019-03-26 NOTE — Brief Op Note (Signed)
03/26/2019  9:29 AM  PATIENT:  Alex Alvarez  73 y.o. male  PRE-OPERATIVE DIAGNOSIS:  Right knee end stage osteoarthritis  POST-OPERATIVE DIAGNOSIS:  Right knee end stage osteoarthritis  PROCEDURE:  Procedure(s): TOTAL KNEE ARTHROPLASTY (Right) Depuy Sigma RP  SURGEON:  Surgeon(s) and Role:    Netta Cedars, MD - Primary  PHYSICIAN ASSISTANT:   ASSISTANTS: [place] Bruns, PA-C   ANESTHESIA:   regional and spinal  EBL:  50 mL   BLOOD ADMINISTERED:none  DRAINS: none   LOCAL MEDICATIONS USED:  NONE  SPECIMEN:  No Specimen  DISPOSITION OF SPECIMEN:  N/A  COUNTS:  YES  TOURNIQUET:   Total Tourniquet Time Documented: Thigh (Right) - 88 minutes Total: Thigh (Right) - 88 minutes   DICTATION: .Other Dictation: Dictation Number [id number]  PLAN OF CARE: Admit to inpatient   PATIENT DISPOSITION:  PACU - hemodynamically stable.   Delay start of Pharmacological VTE agent (>24hrs) due to surgical blood loss or risk of bleeding: no

## 2019-03-26 NOTE — Discharge Instructions (Signed)
Ice to the knee constantly.  Keep the incision covered and clean and dry for one week, then ok to get it wet in the shower.  Do exercise as instructed several times per day. Do NOT prop anything behind the knee as it will make your knee stiff.  Prop under the ankle to encourage knee extension. Wear your immobilizer with your knee out straight at night to maintain knee extension.  You are ok to bear full weight on the knee/leg  Wear the knee high support stockings 24/7 for one month to prevent blood clots  Follow up with Dr Veverly Fells in two weeks in the office, call [phone] for appt

## 2019-03-26 NOTE — Transfer of Care (Signed)
Immediate Anesthesia Transfer of Care Note  Patient: Alex Alvarez  Procedure(s) Performed: TOTAL KNEE ARTHROPLASTY (Right Knee)  Patient Location: PACU  Anesthesia Type:Spinal  Level of Consciousness: drowsy  Airway & Oxygen Therapy: Patient Spontanous Breathing and Patient connected to face mask oxygen  Post-op Assessment: Report given to RN and Post -op Vital signs reviewed and stable  Post vital signs: Reviewed and stable  Last Vitals:  Vitals Value Taken Time  BP 109/72 03/26/19 0937  Temp    Pulse 85 03/26/19 0938  Resp 17 03/26/19 0938  SpO2 95 % 03/26/19 0938  Vitals shown include unvalidated device data.  Last Pain:  Vitals:   03/26/19 0545  TempSrc: Oral         Complications: No apparent anesthesia complications

## 2019-03-26 NOTE — Interval H&P Note (Signed)
History and Physical Interval Note:  03/26/2019 7:29 AM  Alex Alvarez  has presented today for surgery, with the diagnosis of Right knee end stage osteoarthritis.  The various methods of treatment have been discussed with the patient and family. After consideration of risks, benefits and other options for treatment, the patient has consented to  Procedure(s): TOTAL KNEE ARTHROPLASTY (Right) as a surgical intervention.  The patient's history has been reviewed, patient examined, no change in status, stable for surgery.  I have reviewed the patient's chart and labs.  Questions were answered to the patient's satisfaction.     Augustin Schooling

## 2019-03-26 NOTE — Op Note (Signed)
NAME: Alex Alvarez, FEUERBORN MEDICAL RECORD IW:[id number] ACCOUNT [id number] DATE OF BIRTH:1946-05-01 FACILITY[facility] LOCATION: [facility] PHYSICIAN:STEVEN Orlena Sheldon, MD  OPERATIVE REPORT  DATE OF PROCEDURE:  03/26/2019  PREOPERATIVE DIAGNOSIS:  Right knee end-stage osteoarthritis.  POSTOPERATIVE DIAGNOSIS:  Right knee end-stage osteoarthritis.  PROCEDURE PERFORMED:  Right total knee arthroplasty using DePuy Sigma rotating platform prosthesis.  ATTENDING SURGEON:  Esmond Plants, MD  ASSISTANT:  Darol Destine, [place], who was scrubbed during the entire procedure and necessary for satisfactory completion of surgery.  ANESTHESIA:  Adductor canal block plus spinal anesthesia was used.  ESTIMATED BLOOD LOSS:  Minimal.  FLUID REPLACEMENT:  1500 mL crystalloid.  INSTRUMENT COUNTS:  Correct.  COMPLICATIONS:  None.  ANTIBIOTICS:  Perioperative antibiotics and TXA were given.  TOURNIQUET TIME:  1 hour and 50 minutes at 325 mmHg.  INDICATIONS:  The patient is a 73 year old male who has end-stage arthritis of the right knee.  He has had progressive pain and loss of function and loss of range of motion despite conservative management.  He is already status post left total knee  replacement and has done well with that.  He presents for his right knee for total knee arthroplasty.  Informed consent was obtained.  DESCRIPTION OF PROCEDURE:  After an adequate level of anesthesia was achieved, the patient was positioned supine on the operating table.  Right leg correctly identified.  Nonsterile tourniquet placed on proximal thigh.  Right leg was sterilely prepped  and draped in the usual manner.  Time-out called, verifying correct patient and correct site.  We elevated the leg and exsanguinated using Esmarch bandage.  We then went ahead and placed the knee in flexion, performed a midline incision using a 10-blade  scalpel.  Dissection down through subcutaneous tissues.  We performed a medial  parapatellar arthrotomy with a fresh 10-blade scalpel.  We then divided the lateral patellofemoral ligaments, everted the patella, and exposed the knee.  Noted to be end-stage  arthritis with eburnated bone.  At this point, we entered the distal femur with a step-cut drill.  We then placed our intramedullary resection guide and resected 10 mm of bone off the right distal femur, set on 5 degrees right.  We then sized the femur  to size 5 anterior down and performed anterior and posterior and chamfer cuts with a 4-in-1 block.  We removed ACL, PCL, and meniscal tissues and then subluxed the tibia anteriorly, exposing the proximal tibia.  Next, we used an external alignment guide  and performed our tibial cut 90 degrees perpendicular to the long axis of the tibia with minimal posterior slope for the posterior cruciate substituting prosthesis.  We sized the tibia size 5.  We then went ahead and checked our gaps, which were  symmetric at 12.5 mm.  At this point, we removed excess posterior osteophytes off the femur with a lamina spreader and a 3/4-inch curved osteotome.  We then went ahead and completed our tibial preparation with the modular drill and keel punch for the 5  tibia.  We then went ahead and did the box cut guide for the posterior cruciate, substituting a femoral prosthesis.  This was a size 5 right and then impacted that in position.  We reduced the knee with a 12.5 poly trial and were happy with our soft  tissue balancing and obtained full extension.  We then went ahead and resurfaced the patella.  We sized it at 24 mm precut and then went ahead and selected a 16 mm  for our setting and cut with the patellar cutting guide.  Once we had the patellar cut, we  sized it to a #38 patellar button.  We drilled the lug holes for the 38 and then placed the patella in place.  We ranged the knee which had excellent patellar tracking with no-touch technique.  We removed all trial components, pulse irrigated the  knee,  dried the bone thoroughly, and vacuum mixed DePuy high viscosity cement on the back table.  We then cemented the components into place.  A 5 tibia, 5 right femur with a 12.5 poly trial and then the 38 patellar button.  We used a patellar clamp to hold  that while the cement hardened.  Once the cement hardened, we had the knee in extension as well.  We removed all excess cement with 1/4-inch curved osteotome, and at this point, we irrigated the knee thoroughly.  We exchanged the trial poly for the 12.5  poly and then reduced the knee.  We had a nice little pop as we reduced the femoral condyle on the medial side.  We ranged the knee, excellent alignment and excellent stability both in flexion and extension.  We irrigated again copiously.  We then  repaired the parapatellar arthrotomy with #1 Vicryl suture followed by 2-0 Vicryl for subcutaneous closure and 4-0 Monocryl for skin.  Steri-Strips applied, followed by sterile dressing.  The patient tolerated surgery well.  LN/NUANCE  D:03/26/2019 T:03/26/2019 JOB:[id number]

## 2019-03-26 NOTE — Anesthesia Procedure Notes (Signed)
Anesthesia Regional Block: Adductor canal block   Pre-Anesthetic Checklist: ,, timeout performed, Correct Patient, Correct Site, Correct Laterality, Correct Procedure, Correct Position, site marked, Risks and benefits discussed,  Surgical consent,  Pre-op evaluation,  At surgeon's request and post-op pain management  Laterality: Right and Lower  Prep: Maximum Sterile Barrier Precautions used, chloraprep       Needles:  Injection technique: Single-shot  Needle Type: Echogenic Stimulator Needle     Needle Length: 10cm      Additional Needles:   Procedures:,,,, ultrasound used (permanent image in chart),,,,  Narrative:  Start time: 03/26/2019 7:00 AM End time: 03/26/2019 7:05 AM Injection made incrementally with aspirations every 5 mL.  Performed by: Personally  Anesthesiologist: Montez Hageman, MD  Additional Notes: Risks, benefits and alternative to block explained extensively.  Patient tolerated procedure well, without complications.

## 2019-03-26 NOTE — Anesthesia Procedure Notes (Signed)
Procedure Name: MAC Date/Time: 03/26/2019 7:38 AM Performed by: Eben Burow, CRNA Pre-anesthesia Checklist: Patient identified, Emergency Drugs available, Suction available, Patient being monitored and Timeout performed Oxygen Delivery Method: Simple face mask Dental Injury: Teeth and Oropharynx as per pre-operative assessment

## 2019-03-26 NOTE — Anesthesia Postprocedure Evaluation (Signed)
Anesthesia Post Note  Patient: Alex Alvarez  Procedure(s) Performed: TOTAL KNEE ARTHROPLASTY (Right Knee)     Patient location during evaluation: PACU Anesthesia Type: Spinal Level of consciousness: awake and alert Pain management: pain level controlled Vital Signs Assessment: post-procedure vital signs reviewed and stable Respiratory status: spontaneous breathing and respiratory function stable Cardiovascular status: blood pressure returned to baseline and stable Postop Assessment: no headache, no backache, spinal receding and no apparent nausea or vomiting Anesthetic complications: no    Last Vitals:  Vitals:   03/26/19 1015 03/26/19 1030  BP: 121/79 119/83  Pulse: (!) 54 (!) 57  Resp: 16 20  Temp:    SpO2: 96% 95%    Last Pain:  Vitals:   03/26/19 1030  TempSrc:   PainSc: 0-No pain                 Montez Hageman

## 2019-03-26 NOTE — Evaluation (Signed)
Physical Therapy Evaluation Patient Details Name: Alex Alvarez MRN: [id number] DOB: 02-Aug-1946 Today's Date: 03/26/2019   History of Present Illness  s/p L DA THA. PMH: afib, HTN, L TKA  Clinical Impression  Pt is s/p TKA resulting in the deficits listed below (see PT Problem List).  Pt  amb  ~ 47' with min assist, distance limited by PT.  Will continue to follow, anticipate steady progress.  Pt will benefit from skilled PT to increase their independence and safety with mobility to allow discharge to the venue listed below.      Follow Up Recommendations Follow surgeon's recommendation for DC plan and follow-up therapies    Equipment Recommendations  None recommended by PT    Recommendations for Other Services       Precautions / Restrictions Precautions Precautions: Knee;Fall Required Braces or Orthoses: Knee Immobilizer - Right Knee Immobilizer - Right: On except when in CPM Restrictions Weight Bearing Restrictions: No Other Position/Activity Restrictions: WBAT      Mobility  Bed Mobility Overal bed mobility: Needs Assistance Bed Mobility: Supine to Sit     Supine to sit: Min guard     General bed mobility comments: for safety  Transfers Overall transfer level: Needs assistance Equipment used: Rolling walker (2 wheeled) Transfers: Sit to/from Stand Sit to Stand: Min assist         General transfer comment: assist to rise and stabilize  Ambulation/Gait   Gait Distance (Feet): 80 Feet Assistive device: Rolling walker (2 wheeled) Gait Pattern/deviations: Step-to pattern;Decreased weight shift to right     General Gait Details: cues for sequence and RW position  Stairs            Wheelchair Mobility    Modified Rankin (Stroke Patients Only)       Balance                                             Pertinent Vitals/Pain Pain Assessment: 0-10 Pain Score: 4  Pain Location: right Pain Descriptors / Indicators:  Grimacing;Sore Pain Intervention(s): Monitored during session;Limited activity within patient's tolerance;Premedicated before session    Alex Alvarez expects to be discharged to:: Private residence Living Arrangements: Spouse/significant other Available Help at Discharge: Family Type of Home: House Home Access: [place]: One [place]: Bedside commode;Walker - 2 wheels;Shower seat Additional Comments: wife is in w/c however have hired aids to assist while pt recover    Prior Function Level of Independence: Independent               Hand Dominance        Extremity/Trunk Assessment   Upper Extremity Assessment Upper Extremity Assessment: Overall WFL for tasks assessed    Lower Extremity Assessment Lower Extremity Assessment: RLE deficits/detail RLE Deficits / Details: ankle WFL, knee extension and hip flexion 2+/5, limited by post op pain and weakness       Communication   Communication: No difficulties  Cognition Arousal/Alertness: Awake/alert Behavior During Therapy: WFL for tasks assessed/performed Overall Cognitive Status: Within Functional Limits for tasks assessed                                        General Comments      Exercises  Assessment/Plan    PT Assessment Patient needs continued PT services  PT Problem List Decreased strength;Decreased mobility;Decreased activity tolerance;Decreased knowledge of use of DME;Pain;Decreased range of motion       PT Treatment Interventions DME instruction;Gait training;Functional mobility training;Therapeutic activities;Patient/family education;Therapeutic exercise    PT Goals (Current goals can be found in the Care Plan section)  Acute Rehab PT Goals PT Goal Formulation: With patient Time For Goal Achievement: 04/02/19 Potential to Achieve Goals: Good    Frequency 7X/week   Barriers to discharge        Co-evaluation                AM-PAC PT "6 Clicks" Mobility  Outcome Measure Help needed turning from your back to your side while in a flat bed without using bedrails?: A Little Help needed moving from lying on your back to sitting on the side of a flat bed without using bedrails?: A Little Help needed moving to and from a bed to a chair (including a wheelchair)?: A Little Help needed standing up from a chair using your arms (e.g., wheelchair or bedside chair)?: A Little Help needed to walk in hospital room?: A Little Help needed climbing 3-5 steps with a railing? : A Little 6 Click Score: 18    End of Session Equipment Utilized During Treatment: Gait belt Activity Tolerance: Patient tolerated treatment well Patient left: with call bell/phone within reach;in chair;with chair alarm set   PT Visit Diagnosis: Difficulty in walking, not elsewhere classified (R26.2)    Time: 2122-4825 PT Time Calculation (min) (ACUTE ONLY): 18 min   Charges:   PT Evaluation $PT Eval Low Complexity: 1 Low          Alex Alvarez, PT  Pager: [phone] Acute Rehab Dept [facility]): [phone]   03/26/2019   [facility] 03/26/2019, 4:17 PM

## 2019-03-27 DIAGNOSIS — Z96659 Presence of unspecified artificial knee joint: Secondary | ICD-10-CM

## 2019-03-27 DIAGNOSIS — M1711 Unilateral primary osteoarthritis, right knee: Secondary | ICD-10-CM | POA: Diagnosis not present

## 2019-03-27 LAB — BASIC METABOLIC PANEL
Anion gap: 5 (ref 5–15)
BUN: 16 mg/dL (ref 8–23)
CO2: 32 mmol/L (ref 22–32)
Calcium: 8.6 mg/dL — ABNORMAL LOW (ref 8.9–10.3)
Chloride: 99 mmol/L (ref 98–111)
Creatinine, Ser: 1.08 mg/dL (ref 0.61–1.24)
GFR calc Af Amer: >60 mL/min (ref >60)
GFR calc non Af Amer: >60 mL/min (ref >60)
Glucose, Bld: 150 mg/dL — ABNORMAL HIGH (ref 70–99)
Potassium: 5.2 mmol/L — ABNORMAL HIGH (ref 3.5–5.1)
Sodium: 136 mmol/L (ref 135–145)

## 2019-03-27 LAB — CBC
HCT: 45.1 % (ref 39.0–52.0)
Hemoglobin: 13.8 g/dL (ref 13.0–17.0)
MCH: 32.1 pg (ref 26.0–34.0)
MCHC: 30.6 g/dL (ref 30.0–36.0)
MCV: 104.9 fL — ABNORMAL HIGH (ref 80.0–100.0)
Platelets: 141 10*3/uL — ABNORMAL LOW (ref 150–400)
RBC: 4.3 MIL/uL (ref 4.22–5.81)
RDW: 11.8 % (ref 11.5–15.5)
WBC: 12.9 10*3/uL — ABNORMAL HIGH (ref 4.0–10.5)
nRBC: 0 % (ref 0.0–0.2)

## 2019-03-27 NOTE — Progress Notes (Addendum)
Subjective: 1 Day Post-Op Procedure(s) (LRB): TOTAL KNEE ARTHROPLASTY (Right) Patient reports pain as mild.   Foley removed, Due to void +flatus, -BM Tolerating PO w/o N/V Denies CP/SOB, Dizziness, calf pain, sweats/chills.   Objective: Vital signs in last 24 hours: Temp:  [97.5 F (36.4 C)-98.6 F (37 C)] 97.8 F (36.6 C) (07/25 0614) Pulse Rate:  [53-75] 57 (07/25 0614) Resp:  [11-20] 16 (07/25 0614) BP: (109-150)/(72-100) 121/78 (07/25 0614) SpO2:  [90 %-99 %] 96 % (07/25 0614)  Intake/Output from previous day: 07/24 0701 - 07/25 0700 In: 2863.7 [I.V.:2513.7; IV Piggyback:350] Out: 5643 [Urine:4500; Blood:50] Intake/Output this shift: No intake/output data recorded.  Recent Labs    03/24/19 1036 03/27/19 0326  HGB 15.0 13.8   Recent Labs    03/24/19 1036 03/27/19 0326  WBC 6.0 12.9*  RBC 4.69 4.30  HCT 48.1 45.1  PLT 152 141*   Recent Labs    03/24/19 1036 03/27/19 0326  NA 140 136  K 4.4 5.2*  CL 104 99  CO2 28 32  BUN 18 16  CREATININE 1.25* 1.08  GLUCOSE 135* 150*  CALCIUM 8.9 8.6*   No results for input(s): LABPT, INR in the last 72 hours.  Neurologically intact ABD soft Neurovascular intact Sensation intact distally Intact pulses distally Dorsiflexion/Plantar flexion intact Incision: dressing C/D/I No cellulitis present Compartment soft   Assessment/Plan: 1 Day Post-Op Procedure(s) (LRB): TOTAL KNEE ARTHROPLASTY (Right) Advance diet Up with therapy Due to void WBAT DVT PPx: Teds, SCDs, ambulation, ELIQUIS Pain well controlled on current meds Dressing change tomorrow (Sunday) Plan D/C home tomorrow (Sunday) vs Monday  Yvonne Kendall Ward 03/27/2019, 8:57 AM   Addendum: Will hold spironolactone due to elevated K. Ordered new BMP to re-eval K tomorrow.

## 2019-03-27 NOTE — Care Management CC44 (Signed)
Condition Code 44 Documentation Completed  Patient Details  Name: Alex Alvarez MRN: [id number] Date of Birth: July 26, 1946   Condition Code 44 given:  Yes Patient signature on Condition Code 44 notice:  Yes Documentation of 2 MD's agreement:  Yes Code 44 added to claim:  Yes    Joaquin Courts, RN 03/27/2019, 4:57 PM

## 2019-03-27 NOTE — Progress Notes (Signed)
   03/27/19 1600  PT Visit Information  Last PT Received On 03/27/19---pt progressing well, concerned about his O2 sats which were in the 90s on RA when checked by PT. RN present. Continues to be mildly dizzy with amb.  Continue to follow   Assistance Needed +1  History of Present Illness s/p R TKA. PMH: afib, HTN, L TKA  Precautions  Precautions Knee;Fall  Precaution Comments independent SLRs  Required Braces or Orthoses Knee Immobilizer - Right  Knee Immobilizer - Right On except when in CPM  Restrictions  Weight Bearing Restrictions No  Other Position/Activity Restrictions WBAT  Pain Assessment  Pain Assessment 0-10  Pain Score 4  Pain Location right knee  Pain Descriptors / Indicators Grimacing;Sore  Pain Intervention(s) Limited activity within patient's tolerance;Monitored during session;Premedicated before session;Repositioned  Cognition  Arousal/Alertness Awake/alert  Behavior During Therapy WFL for tasks assessed/performed  Overall Cognitive Status Within Functional Limits for tasks assessed  Bed Mobility  Overal bed mobility Needs Assistance  Bed Mobility Sit to Supine  Sit to supine Min guard  General bed mobility comments for safety  Transfers  Overall transfer level Needs assistance  Equipment used Rolling walker (2 wheeled)  Transfers Sit to/from Stand  Sit to Stand Min guard;Min assist  General transfer comment assist to rise and stabilize, transition to RW   Ambulation/Gait  Ambulation/Gait assistance Min guard  Gait Distance (Feet) 70 Feet  Assistive device Rolling walker (2 wheeled)  Gait Pattern/deviations Step-to pattern;Decreased weight shift to right  General Gait Details cues for sequence and RW position  Total Joint Exercises  Ankle Circles/Pumps AROM;Right;10 reps  Quad Sets AROM;Right;10 reps  Heel Slides AROM;AAROM;Right;10 reps  Hip ABduction/ADduction AROM;Right;10 reps;AAROM  Straight Leg Raises AROM;Right;15 reps;AAROM  PT - End of Session   Equipment Utilized During Treatment Gait belt  Activity Tolerance Patient tolerated treatment well  Patient left with call bell/phone within reach;in bed;with bed alarm set   PT - Assessment/Plan  PT Plan Current plan remains appropriate  PT Visit Diagnosis Difficulty in walking, not elsewhere classified (R26.2)  PT Frequency (ACUTE ONLY) 7X/week  Follow Up Recommendations Follow surgeon's recommendation for DC plan and follow-up therapies  PT equipment None recommended by PT  AM-PAC PT "6 Clicks" Mobility Outcome Measure (Version 2)  Help needed turning from your back to your side while in a flat bed without using bedrails? 3  Help needed moving from lying on your back to sitting on the side of a flat bed without using bedrails? 3  Help needed moving to and from a bed to a chair (including a wheelchair)? 3  Help needed standing up from a chair using your arms (e.g., wheelchair or bedside chair)? 3  Help needed to walk in hospital room? 3  Help needed climbing 3-5 steps with a railing?  3  6 Click Score 18  Consider Recommendation of Discharge To: Home with [facility]  PT Goal Progression  Progress towards PT goals Progressing toward goals  Acute Rehab PT Goals  PT Goal Formulation With patient  Time For Goal Achievement 04/02/19  Potential to Achieve Goals Good  PT Time Calculation  PT Start Time (ACUTE ONLY) 1516  PT Stop Time (ACUTE ONLY) 1529  PT Time Calculation (min) (ACUTE ONLY) 13 min  PT General Charges  $$ ACUTE PT VISIT 1 Visit  PT Treatments  $Therapeutic Exercise 8-22 mins

## 2019-03-27 NOTE — Progress Notes (Signed)
Physical Therapy Treatment Patient Details Name: Alex Alvarez MRN: [id number] DOB: August 03, 1946 Today's Date: 03/27/2019    History of Present Illness s/p R TKA. PMH: afib, HTN, L TKA    PT Comments    Progressing well, some dizziness with amb; VSS; will see again in pm   Follow Up Recommendations  Follow surgeon's recommendation for DC plan and follow-up therapies     Equipment Recommendations  None recommended by PT    Recommendations for Other Services       Precautions / Restrictions Precautions Precautions: Knee;Fall Precaution Comments: independent SLRs Required Braces or Orthoses: Knee Immobilizer - Right Knee Immobilizer - Right: On except when in CPM Restrictions Weight Bearing Restrictions: No Other Position/Activity Restrictions: WBAT    Mobility  Bed Mobility Overal bed mobility: Needs Assistance Bed Mobility: Supine to Sit     Supine to sit: Min guard     General bed mobility comments: for safety  Transfers Overall transfer level: Needs assistance Equipment used: Rolling walker (2 wheeled) Transfers: Sit to/from Stand Sit to Stand: Min guard;Min assist         General transfer comment: assist to rise and stabilize  Ambulation/Gait Ambulation/Gait assistance: Min guard;Min assist Gait Distance (Feet): 100 Feet Assistive device: Rolling walker (2 wheeled) Gait Pattern/deviations: Step-to pattern;Decreased weight shift to right     General Gait Details: cues for sequence and RW position   Stairs             Wheelchair Mobility    Modified Rankin (Stroke Patients Only)       Balance                                            Cognition Arousal/Alertness: Awake/alert Behavior During Therapy: WFL for tasks assessed/performed Overall Cognitive Status: Within Functional Limits for tasks assessed                                        Exercises      General Comments        Pertinent  Vitals/Pain Pain Assessment: 0-10 Pain Score: 3  Pain Location: right knee Pain Descriptors / Indicators: Grimacing;Sore Pain Intervention(s): Limited activity within patient's tolerance;Monitored during session;Premedicated before session;Ice applied    Home Living                      Prior Function            PT Goals (current goals can now be found in the care plan section) Acute Rehab PT Goals PT Goal Formulation: With patient Time For Goal Achievement: 04/02/19 Potential to Achieve Goals: Good Progress towards PT goals: Progressing toward goals    Frequency    7X/week      PT Plan Current plan remains appropriate    Co-evaluation              AM-PAC PT "6 Clicks" Mobility   Outcome Measure  Help needed turning from your back to your side while in a flat bed without using bedrails?: A Little Help needed moving from lying on your back to sitting on the side of a flat bed without using bedrails?: A Little Help needed moving to and from a bed to a chair (including a wheelchair)?: A Little  Help needed standing up from a chair using your arms (e.g., wheelchair or bedside chair)?: A Little Help needed to walk in hospital room?: A Little Help needed climbing 3-5 steps with a railing? : A Little 6 Click Score: 18    End of Session Equipment Utilized During Treatment: Gait belt Activity Tolerance: Patient tolerated treatment well Patient left: with call bell/phone within reach;in chair;with chair alarm set   PT Visit Diagnosis: Difficulty in walking, not elsewhere classified (R26.2)     Time: 0122-2411 PT Time Calculation (min) (ACUTE ONLY): 26 min  Charges:  $Gait Training: 23-37 mins                     Kenyon Ana, PT  Pager: [phone] Acute Rehab Dept [facility]): [phone]   03/27/2019    [facility] 03/27/2019, 12:32 PM

## 2019-03-27 NOTE — TOC Initial Note (Signed)
Transition of Care [facility]) - Initial/Assessment Note    Patient Details  Name: Alex Alvarez MRN: [id number] Date of Birth: 1945-09-07  Transition of [facility]) CM/SW Contact:    Joaquin Courts, RN Phone Number: 03/27/2019, 3:42 PM  Clinical Narrative:  CM spoke with patient at bedside, patient set up with [facility] for [place]. Patient reports he has rolling walker and 3-in-1 at home.                  Expected Discharge Plan: [place] Barriers to Discharge: Continued Medical Work up   Patient Goals and CMS Choice Patient states their goals for this hospitalization and ongoing recovery are:: to go home CMS Medicare.gov Compare Post Acute Care list provided to:: Patient Choice offered to / list presented to : Patient  Expected Discharge Plan and Services Expected Discharge Plan: [place]   Discharge Planning Services: CM Consult Post Acute Care Choice: [place] arrangements for the past 2 months: Single Family Home Expected Discharge Date: 03/27/19               DME Arranged: N/A DME Agency: NA       HH Arranged: PT [place] Agency: [facility] [employer] Date [place]: 03/27/19 Time [place]: [place] Representative spoke with at [place]: Alwyn Ren  Prior Living Arrangements/Services Living arrangements for the past 2 months: [place] Lives with:: Spouse Patient language and need for interpreter reviewed:: Yes Do you feel safe going back to the place where you live?: Yes      Need for Family Participation in Patient Care: Yes (Comment) Care giver support system in place?: Yes (comment)   Criminal Activity/Legal Involvement Pertinent to Current Situation/Hospitalization: No - Comment as needed  Activities of Daily Living Home Assistive Devices/Equipment: Environmental consultant (specify type), Bedside commode/3-in-1, Eyeglasses ADL Screening (condition at time of admission) Patient's  cognitive ability adequate to safely complete daily activities?: Yes Is the patient deaf or have difficulty hearing?: No Does the patient have difficulty seeing, even when wearing glasses/contacts?: No Does the patient have difficulty concentrating, remembering, or making decisions?: No Patient able to express need for assistance with ADLs?: Yes Does the patient have difficulty dressing or bathing?: No Independently performs ADLs?: Yes (appropriate for developmental age) Does the patient have difficulty walking or climbing stairs?: Yes Weakness of Legs: Right Weakness of Arms/Hands: None  Permission Sought/Granted                  Emotional Assessment Appearance:: Appears stated age Attitude/Demeanor/Rapport: Engaged Affect (typically observed): Accepting Orientation: : Oriented to Place, Oriented to  Time, Oriented to Situation, Oriented to Self   Psych Involvement: No (comment)  Admission diagnosis:  Right knee end stage osteoarthritis Patient Active Problem List   Diagnosis Date Noted  . Status post total knee replacement, right 03/26/2019  . Pain in right knee 02/03/2019  . Trigger ring finger of left hand 07/12/2018  . MRSA (methicillin resistant staph aureus) culture positive 03/04/2018  . Skin ulcer of toe of right foot, limited to breakdown of skin ([place]) 03/02/2018  . Seasonal allergies 12/31/2017  . Toenail fungus 12/25/2016  . Arthralgia of multiple joints 06/26/2016  . Chronic insomnia 12/18/2015  . H/O total knee replacement 07/07/2015  . Venous insufficiency of lower extremity 12/25/2013  . Longstanding persistent atrial fibrillation 12/25/2013  . Dyslipidemia - on statin; monitored by PCP 12/25/2013  . Preoperative cardiovascular examination  12/25/2013  . Obesity (BMI 30-39.9) 11/06/2007  . Essential hypertension 11/06/2007  . Bilateral lower extremity edema 11/06/2007  . DYSPNEA 11/06/2007  . Edema 11/06/2007   PCP:  Christain [place], MD Pharmacy:    [id number] [place] [place] - [address] [place] [address] Phone: [phone] Fax: [phone]  [place] [address] [place] [address] Phone: [phone] Fax: [place] [address] [place] [address] [place] [address] Phone: [phone] Fax: [phone]     Social Determinants of Health (SDOH) Interventions    Readmission Risk Interventions No flowsheet data found.

## 2019-03-27 NOTE — Plan of Care (Signed)
  Problem: Activity: Goal: Range of joint motion will improve Outcome: Progressing   Problem: Clinical Measurements: Goal: Postoperative complications will be avoided or minimized Outcome: Progressing   Problem: Pain Management: Goal: Pain level will decrease with appropriate interventions Outcome: Progressing   

## 2019-03-27 NOTE — Care Management Obs Status (Signed)
[place] NOTIFICATION   Patient Details  Name: Alex Alvarez MRN: [id number] Date of Birth: [birth year]   Medicare Observation Status Notification Given:  Yes    Joaquin Courts, RN 03/27/2019, 4:57 PM

## 2019-03-27 NOTE — Progress Notes (Signed)
Pt stable at time of bedside rounding with Rich Fuchs. No pain or complications to note. Pt up in chair.

## 2019-03-27 NOTE — Plan of Care (Signed)
  Problem: Education: Goal: Knowledge of the prescribed therapeutic regimen will improve Outcome: Progressing   Problem: Activity: Goal: Ability to avoid complications of mobility impairment will improve Outcome: Progressing   Problem: Activity: Goal: Range of joint motion will improve Outcome: Progressing   Problem: Clinical Measurements: Goal: Postoperative complications will be avoided or minimized Outcome: Progressing   Problem: Pain Management: Goal: Pain level will decrease with appropriate interventions Outcome: Progressing

## 2019-03-27 NOTE — Progress Notes (Signed)
    Home health agencies that serve [phone].        Home Health Agencies Search Results  Results List Table  Home Health Agency Information Quality of Patient Care Rating Patient Survey Summary Rating  [facility] [phone] 4 out of 5 stars 4 out of 5 stars  [facility] [phone] 4  out of 5 stars 3 out of 5 stars  [facility] [phone] 4 out of 5 stars 4 out of 5 stars  [facility] [phone] 4 out of 5 stars 4 out of 5 stars  [facility] [place] [phone] 3  out of 5 stars 4 out of 5 stars  [facility] [phone] 3 out of 5 stars 4 out of 5 stars  [facility] [phone] 4 out of 5 stars Not Available12  [facility] [phone] 3  out of 5 stars 3 out of 5 stars  [facility] [phone] 3  out of 5 stars 4 out of 5 stars  [facility] [phone] 3  out of 5 stars 3 out of 5 stars  [facility] [phone] 4  out of 5 stars 3 out of 5 stars  [facility] [phone] 4  out of 5 stars 2 out of 5 stars   Home Health Footnotes  Footnote number Footnote as displayed on Home Health Compare  1 This agency provides services under a federal waiver program to non-traditional, chronic long term population.  2 This agency provides services to a special needs population.  3 Not Available.  4 The number of patient episodes for this measure is too small to report.  5 This measure currently does not have data or provider has been certified/recertified for less than 6 months.  6 The national average for this measure is not provided because of state-to-state differences in data collection.  7 Medicare is not displaying rates for this measure for any home health agency, because of an issue with the data.  8 There were problems with the data and they are being corrected.  9 Zero, or very few,  patients met the survey's rules for inclusion. The scores shown, if any, reflect a very small number of surveys and may not accurately tell how an agency is doing.  10 Survey results are based on less than 12 months of data.  11 Fewer than 70 patients completed the survey. Use the scores shown, if any, with caution as the number of surveys may be too low to accurately tell how an agency is doing.  12 No survey results are available for this period.  13 Data suppressed by CMS for one or more quarters.    

## 2019-03-28 DIAGNOSIS — M1711 Unilateral primary osteoarthritis, right knee: Secondary | ICD-10-CM | POA: Diagnosis not present

## 2019-03-28 LAB — BASIC METABOLIC PANEL
Anion gap: 7 (ref 5–15)
BUN: 24 mg/dL — ABNORMAL HIGH (ref 8–23)
CO2: 30 mmol/L (ref 22–32)
Calcium: 8.4 mg/dL — ABNORMAL LOW (ref 8.9–10.3)
Chloride: 96 mmol/L — ABNORMAL LOW (ref 98–111)
Creatinine, Ser: 1.09 mg/dL (ref 0.61–1.24)
GFR calc Af Amer: >60 mL/min (ref >60)
GFR calc non Af Amer: >60 mL/min (ref >60)
Glucose, Bld: 133 mg/dL — ABNORMAL HIGH (ref 70–99)
Potassium: 4 mmol/L (ref 3.5–5.1)
Sodium: 133 mmol/L — ABNORMAL LOW (ref 135–145)

## 2019-03-28 LAB — CBC
HCT: 42.2 % (ref 39.0–52.0)
Hemoglobin: 13.2 g/dL (ref 13.0–17.0)
MCH: 32.4 pg (ref 26.0–34.0)
MCHC: 31.3 g/dL (ref 30.0–36.0)
MCV: 103.7 fL — ABNORMAL HIGH (ref 80.0–100.0)
Platelets: 128 10*3/uL — ABNORMAL LOW (ref 150–400)
RBC: 4.07 MIL/uL — ABNORMAL LOW (ref 4.22–5.81)
RDW: 11.8 % (ref 11.5–15.5)
WBC: 14.5 10*3/uL — ABNORMAL HIGH (ref 4.0–10.5)
nRBC: 0 % (ref 0.0–0.2)

## 2019-03-28 NOTE — Progress Notes (Signed)
Physical Therapy Treatment Patient Details Name: Alex Alvarez MRN: [id number] DOB: [birth year] Today's Date: 03/28/2019    History of Present Illness s/p R TKA. PMH: afib, HTN, L TKA    PT Comments    Pt progressing well; reviewed HEP, gait, safety. Pt feels ready for d/c home   Follow Up Recommendations  Follow surgeon's recommendation for DC plan and follow-up therapies     Equipment Recommendations  None recommended by PT    Recommendations for Other Services       Precautions / Restrictions Precautions Precautions: Knee;Fall Precaution Comments: independent SLRs Required Braces or Orthoses: Knee Immobilizer - Right Knee Immobilizer - Right: On except when in CPM Restrictions Weight Bearing Restrictions: No Other Position/Activity Restrictions: WBAT    Mobility  Bed Mobility   Bed Mobility: Supine to Sit     Supine to sit: Min guard     General bed mobility comments: for safety  Transfers Overall transfer level: Needs assistance Equipment used: Rolling walker (2 wheeled) Transfers: Sit to/from Stand Sit to Stand: Supervision;Min guard         General transfer comment: for safety on transition to RW   Ambulation/Gait Ambulation/Gait assistance: Supervision Gait Distance (Feet): 120 Feet Assistive device: Rolling walker (2 wheeled) Gait Pattern/deviations: Step-to pattern;Decreased weight shift to right     General Gait Details: cues for sequence and RW position   Stairs             Wheelchair Mobility    Modified Rankin (Stroke Patients Only)       Balance                                            Cognition Arousal/Alertness: Awake/alert Behavior During Therapy: WFL for tasks assessed/performed Overall Cognitive Status: Within Functional Limits for tasks assessed                                        Exercises Total Joint Exercises Ankle Circles/Pumps: AROM;Right;10 reps Quad Sets:  AROM;Right;10 reps Heel Slides: AROM;AAROM;Right;10 reps Hip ABduction/ADduction: AROM;Right;10 reps;AAROM Straight Leg Raises: AROM;Right;15 reps;AAROM    General Comments        Pertinent Vitals/Pain Pain Assessment: 0-10 Pain Score: 4  Pain Location: right knee Pain Descriptors / Indicators: Grimacing;Sore Pain Intervention(s): Limited activity within patient's tolerance;Monitored during session;Premedicated before session;Ice applied    Home Living                      Prior Function            PT Goals (current goals can now be found in the care plan section) Acute Rehab PT Goals PT Goal Formulation: With patient Time For Goal Achievement: 04/02/19 Potential to Achieve Goals: Good Progress towards PT goals: Progressing toward goals    Frequency    7X/week      PT Plan Current plan remains appropriate    Co-evaluation              AM-PAC PT "6 Clicks" Mobility   Outcome Measure  Help needed turning from your back to your side while in a flat bed without using bedrails?: A Little Help needed moving from lying on your back to sitting on the side of a flat bed without using  bedrails?: A Little Help needed moving to and from a bed to a chair (including a wheelchair)?: A Little Help needed standing up from a chair using your arms (e.g., wheelchair or bedside chair)?: A Little Help needed to walk in hospital room?: A Little Help needed climbing 3-5 steps with a railing? : A Little 6 Click Score: 18    End of Session Equipment Utilized During Treatment: Gait belt Activity Tolerance: Patient tolerated treatment well Patient left: in chair;with call bell/phone within reach;with chair alarm set   PT Visit Diagnosis: Difficulty in walking, not elsewhere classified (R26.2)     Time: 1000-1031 PT Time Calculation (min) (ACUTE ONLY): 31 min  Charges:  $Gait Training: 8-22 mins $Therapeutic Exercise: 8-22 mins                     Kenyon Ana,  PT  Pager: [phone] Acute Rehab Dept [facility]): [phone]   03/28/2019    [facility] 03/28/2019, 11:31 AM

## 2019-03-28 NOTE — Progress Notes (Signed)
    Subjective:  Patient reports pain as mild to moderate.  Denies N/V/CP/SOB. Wants to go home today.  Objective:   VITALS:   Vitals:   03/27/19 1010 03/27/19 1359 03/27/19 2043 03/28/19 0445  BP: 114/71 118/70 (!) 152/89 100/71  Pulse: 67 66 89 61  Resp: 16  16 18   Temp: 97.8 F (36.6 C) 97.7 F (36.5 C) 98.4 F (36.9 C) 98.3 F (36.8 C)  TempSrc: Oral Oral Oral Oral  SpO2: 91% 91% 95% 94%    NAD ABD soft Sensation intact distally Intact pulses distally Dorsiflexion/Plantar flexion intact Incision: dressing with mod dried drainage. No active drainage. Compartment soft Dressing taken down today Able to SLR  Lab Results  Component Value Date   WBC 14.5 (H) 03/28/2019   HGB 13.2 03/28/2019   HCT 42.2 03/28/2019   MCV 103.7 (H) 03/28/2019   PLT 128 (L) 03/28/2019   BMET    Component Value Date/Time   NA 133 (L) 03/28/2019 0315   NA 141 06/25/2017 1122   K 4.0 03/28/2019 0315   CL 96 (L) 03/28/2019 0315   CO2 30 03/28/2019 0315   GLUCOSE 133 (H) 03/28/2019 0315   BUN 24 (H) 03/28/2019 0315   BUN 22 06/25/2017 1122   CREATININE 1.09 03/28/2019 0315   CALCIUM 8.4 (L) 03/28/2019 0315   GFRNONAA >60 03/28/2019 0315   GFRAA >60 03/28/2019 0315     Assessment/Plan: 2 Days Post-Op   Active Problems:   Status post total knee replacement, right   Total knee replacement status   WBAT with walker DVT ppx: Apixaban, SCDs, TEDS PO pain control PT/OT Dispo: RN to place Aquacel dressing, d/c home after clears PT   Alex Alvarez 03/28/2019, 8:59 AM   Alex Can, MD Cell: [phone] [facility] now [facility]  [facility] [address], [address], [place], [place] [address] Phone: [phone] [email or web address] Facebook  [employer]

## 2019-03-28 NOTE — Plan of Care (Signed)
Pt to d/c home today once cleared by physical therapy. No needs at this time. Rn medicating for pain and encouraging use of incentive spirometer.

## 2019-03-28 NOTE — Progress Notes (Signed)
Pt stable with no needs at this time. Pt medicated for pain. Pt to d/c home when ride arrives. No questions on d/c instructions and education. Pt did well physical therapy prior to d/c.

## 2019-03-28 NOTE — Discharge Summary (Signed)
Physician Discharge Summary  Patient ID: Alex Alvarez MRN: [id number] DOB/AGE: 01-27-1946 73 y.o.  Admit date: 03/26/2019 Discharge date: 03/28/2019  Admission Diagnoses:  Osteoarthritis of right knee  Discharge Diagnoses:  Principal Problem:   Osteoarthritis of right knee Active Problems:   Status post total knee replacement, right   Total knee replacement status   Past Medical History:  Diagnosis Date  . Bilateral lower extremity edema   . 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 Chronic atrial fibrillation 8/132014 office note   chronic afib dates back to June 2012; CHADS2-VASc score 2; candidate for dual  antiplatelet therapy based on active C and active S STUDIES  for thromboembolic prophyaxis--actually ot on coumadin but on aspirin ad plavix  . Chronic insomnia   . Chronically elevated hemidiaphragm    Left  . Dyslipidemia   . Dysrhythmia   . Former light tobacco smoker     smoked a pipe; quit 25+ years ago   . Hypertension   . Obesity (BMI 30.0-34.9)   . 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 Venous stasis of both lower extremities    with edema    Surgeries: Procedure(s): TOTAL KNEE ARTHROPLASTY on 03/26/2019   Consultants (if any):   Discharged Condition: Improved  Hospital Course: Alex Alvarez is an 73 y.o. male who was admitted 03/26/2019 with a diagnosis of Osteoarthritis of right knee and went to the operating room on 03/26/2019 and underwent the above named procedures.    He was given perioperative antibiotics:  Anti-infectives (From admission, onward)   Start     Dose/Rate Route Frequency Ordered Stop   03/26/19 1400  ceFAZolin (ANCEF) IVPB 2g/100 mL premix     2 g 200 mL/hr over 30 Minutes Intravenous Every 6 hours 03/26/19 1111 03/26/19 2025   03/26/19 0600  ceFAZolin (ANCEF) IVPB 2g/100 mL premix     2 g 200 mL/hr over 30 Minutes Intravenous On call to O.R. 03/26/19 5916  03/26/19 0741    .  He was given sequential compression devices, early ambulation, and apixaban for DVT prophylaxis.  He benefited maximally from the hospital stay and there were no complications.    Recent vital signs:  Vitals:   03/27/19 2043 03/28/19 0445  BP: (!) 152/89 100/71  Pulse: 89 61  Resp: 16 18  Temp: 98.4 F (36.9 C) 98.3 F (36.8 C)  SpO2: 95% 94%    Recent laboratory studies:  Lab Results  Component Value Date   HGB 13.2 03/28/2019   HGB 13.8 03/27/2019   HGB 15.0 03/24/2019   Lab Results  Component Value Date   WBC 14.5 (H) 03/28/2019   PLT 128 (L) 03/28/2019   Lab Results  Component Value Date   INR 1.70 (H) 07/10/2015   Lab Results  Component Value Date   NA 133 (L) 03/28/2019   K 4.0 03/28/2019   CL 96 (L) 03/28/2019   CO2 30 03/28/2019   BUN 24 (H) 03/28/2019   CREATININE 1.09 03/28/2019   GLUCOSE 133 (H) 03/28/2019    Discharge Medications:   Allergies as of 03/28/2019      Reactions   Copper Rash   Lipitor [atorvastatin] Other (See Comments)   Leg cramps   Terbinafine Rash      Medication List    TAKE these medications   apixaban 5 MG Tabs tablet Commonly known as: ELIQUIS Take 1 tablet (  5 mg total) by mouth 2 (two) times daily.   aspirin EC 81 MG tablet Take 81 mg by mouth daily.   atorvastatin 10 MG tablet Commonly known as: LIPITOR Take 1 tablet by mouth  daily   carboxymethylcellulose 0.5 % Soln Commonly known as: REFRESH PLUS Place 1 drop into both eyes 2 (two) times daily as needed (dry eyes).   diclofenac 75 MG EC tablet Commonly known as: VOLTAREN Take 75 mg by mouth 2 (two) times daily.   diltiazem 240 MG 24 hr capsule Commonly known as: CARDIZEM CD Take 240 mg by mouth daily.   EpiPen 2-Pak 0.3 mg/0.3 mL Soaj injection Generic drug: EPINEPHrine Inject 0.3 mg into the muscle as needed for anaphylaxis.   irbesartan-hydrochlorothiazide 300-12.5 MG tablet Commonly known as: AVALIDE Take 1 tablet by  mouth daily.   methocarbamol 500 MG tablet Commonly known as: Robaxin Take 1 tablet (500 mg total) by mouth every 8 (eight) hours as needed.   multivitamin with minerals tablet Take 1 tablet by mouth daily.   oxyCODONE-acetaminophen 5-325 MG tablet Commonly known as: Percocet Take 1 tablet by mouth every 4 (four) hours as needed for severe pain.   spironolactone 25 MG tablet Commonly known as: ALDACTONE TAKE ONE-HALF TABLET BY  MOUTH DAILY   traMADol 50 MG tablet Commonly known as: Ultram Take 1-2 tablets (50-100 mg total) by mouth every 6 (six) hours as needed for moderate pain or severe pain.   zolpidem 10 MG tablet Commonly known as: AMBIEN Take 10 mg by mouth at bedtime.       Diagnostic Studies: Dg Knee Right Port  Result Date: 03/26/2019 CLINICAL DATA:  Knee arthroplasty EXAM: PORTABLE RIGHT KNEE - 1-2 VIEW COMPARISON:  None. FINDINGS: Total knee arthroplasty with swelling and gas. The prosthesis is well seated and the knee is located. IMPRESSION: Total knee arthroplasty without complicating features. Electronically Signed   By: Monte Fantasia M.D.   On: 03/26/2019 10:10    Disposition: Discharge disposition: 01-Home or Self Care       Discharge Instructions    Call MD / Call 911   Complete by: As directed    If you experience chest pain or shortness of breath, CALL 911 and be transported to the hospital emergency room.  If you develope a fever above 101 F, pus (white drainage) or increased drainage or redness at the wound, or calf pain, call your surgeon's office.   Constipation Prevention   Complete by: As directed    Drink plenty of fluids.  Prune juice may be helpful.  You may use a stool softener, such as Colace (over the counter) 100 mg twice a day.  Use MiraLax (over the counter) for constipation as needed.   Diet - low sodium heart healthy   Complete by: As directed    Do not put a pillow under the knee. Place it under the heel.   Complete by: As  directed    Increase activity slowly as tolerated   Complete by: As directed    TED hose   Complete by: As directed    Use stockings (TED hose) for 2 weeks on both leg(s).  You may remove them at night for sleeping.      Follow-up Information    Netta Cedars, MD. Call in 2 weeks.   Specialty: Orthopedic Surgery Why: call [phone] for appointment Contact information: [address] [place] [address] [phone]        [facility] Follow up.   Specialty: Home Health Services Why: agency will provide home health physical therapy. agency will call you to schedule first visit. Contact information: [address] [place] [address] [phone]            Signed: Hilton Cork Iwalani Templeton 03/28/2019, 1:19 PM

## 2019-03-29 ENCOUNTER — Telehealth: Payer: Medicare Other | Admitting: Cardiology

## 2019-03-29 ENCOUNTER — Encounter (HOSPITAL_COMMUNITY): Payer: Self-pay | Admitting: Orthopedic Surgery

## 2019-04-08 ENCOUNTER — Other Ambulatory Visit: Payer: Self-pay | Admitting: Cardiology

## 2019-04-08 NOTE — Telephone Encounter (Signed)
72m 109.2kg Scr 1.09 03/28/19 Lovw/lawrence 03/02/19

## 2019-04-26 ENCOUNTER — Encounter: Payer: Self-pay | Admitting: Podiatry

## 2019-04-26 ENCOUNTER — Ambulatory Visit: Payer: Medicare Other | Admitting: Podiatry

## 2019-04-26 ENCOUNTER — Other Ambulatory Visit: Payer: Self-pay | Admitting: Podiatry

## 2019-04-26 ENCOUNTER — Other Ambulatory Visit: Payer: Self-pay

## 2019-04-26 ENCOUNTER — Ambulatory Visit (INDEPENDENT_AMBULATORY_CARE_PROVIDER_SITE_OTHER): Payer: Medicare Other

## 2019-04-26 VITALS — Temp 97.2°F

## 2019-04-26 DIAGNOSIS — L97511 Non-pressure chronic ulcer of other part of right foot limited to breakdown of skin: Secondary | ICD-10-CM

## 2019-04-26 DIAGNOSIS — M2041 Other hammer toe(s) (acquired), right foot: Secondary | ICD-10-CM

## 2019-04-26 NOTE — Patient Instructions (Signed)
Pre-Operative Instructions  Congratulations, you have decided to take an important step towards improving your quality of life.  You can be assured that the doctors and staff at [facility] will be with you every step of the way.  Here are some important things you should know:  1. Plan to be at the surgery center/hospital at least 1 (one) hour prior to your scheduled time, unless otherwise directed by the surgical center/hospital staff.  You must have a responsible adult accompany you, remain during the surgery and drive you home.  Make sure you have directions to the surgical center/hospital to ensure you arrive on time. 2. If you are having surgery at [facility] or [facility] hospitals, you will need a copy of your medical history and physical form from your family physician within one month prior to the date of surgery. We will give you a form for your primary physician to complete.  3. We make every effort to accommodate the date you request for surgery.  However, there are times where surgery dates or times have to be moved.  We will contact you as soon as possible if a change in schedule is required.   4. No aspirin/ibuprofen for one week before surgery.  If you are on aspirin, any non-steroidal anti-inflammatory medications (Mobic, Aleve, Ibuprofen) should not be taken seven (7) days prior to your surgery.  You make take Tylenol for pain prior to surgery.  5. Medications - If you are taking daily heart and blood pressure medications, seizure, reflux, allergy, asthma, anxiety, pain or diabetes medications, make sure you notify the surgery center/hospital before the day of surgery so they can tell you which medications you should take or avoid the day of surgery. 6. No food or drink after midnight the night before surgery unless directed otherwise by surgical center/hospital staff. 7. No alcoholic beverages 24-hours prior to surgery.  No smoking 24-hours prior or 24-hours after  surgery. 8. Wear loose pants or shorts. They should be loose enough to fit over bandages, boots, and casts. 9. Don't wear slip-on shoes. Sneakers are preferred. 10. Bring your boot with you to the surgery center/hospital.  Also bring crutches or a walker if your physician has prescribed it for you.  If you do not have this equipment, it will be provided for you after surgery. 11. If you have not been contacted by the surgery center/hospital by the day before your surgery, call to confirm the date and time of your surgery. 12. Leave-time from work may vary depending on the type of surgery you have.  Appropriate arrangements should be made prior to surgery with your employer. 13. Prescriptions will be provided immediately following surgery by your doctor.  Fill these as soon as possible after surgery and take the medication as directed. Pain medications will not be refilled on weekends and must be approved by the doctor. 14. Remove nail polish on the operative foot and avoid getting pedicures prior to surgery. 15. Wash the night before surgery.  The night before surgery wash the foot and leg well with water and the antibacterial soap provided. Be sure to pay special attention to beneath the toenails and in between the toes.  Wash for at least three (3) minutes. Rinse thoroughly with water and dry well with a towel.  Perform this wash unless told not to do so by your physician.  Enclosed: 1 Ice pack (please put in freezer the night before surgery)   1 Hibiclens skin cleaner     Pre-op instructions  If you have any questions regarding the instructions, please do not hesitate to call our office.  [place]: [address], [place], [place] [address] -- [phone]  [place]: [address] [place], [place] [address] -- [phone]  [place]: [address]  [place], [place] [address] -- [phone]  [place]: [address], [address], [place],  [address] -- [phone]  Website:  [email or web address] 

## 2019-04-27 ENCOUNTER — Telehealth: Payer: Self-pay | Admitting: *Deleted

## 2019-04-27 NOTE — Telephone Encounter (Signed)
DOS 05/03/2019 HAMMER TOE REPAIR 4, 5 RT - [id number]  [employer]:  Effective date - 09/02/2018 - -  In-Network  Individual In-Network (Service Year) Deductible Member's plan does not have a deductible  Out-of-Pocket $48.25 MET YTD  $9,951.75 remaining  $10,000.00 Plan Amt.  [facility] 0.00 Copayment for professional services  This [employer] Medicare Advantage members plan does not currently require a prior authorization for these services. If you have general questions about the prior authorization requirements, please call us at [phone] or visit [email or web address] > Clinician Resources > Advance and Admission Notification Requirements. The number above acknowledges your notification. Please write this number down for future reference. Notification is not a guarantee of coverage or payment.  Decision ID #:[id number]

## 2019-04-28 NOTE — Progress Notes (Signed)
Subjective:  73 year old male presenting today for follow up evaluation of an ulceration of the right fourth toe. He states the blister opened up and drained fluid and blood. He has been applying Silvadene cream as directed. There are no worsening factors indicated at this time. Patient is here for further evaluation and treatment.   Past Medical History:  Diagnosis Date  . Bilateral lower extremity edema   . 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 Chronic atrial fibrillation 8/132014 office note   chronic afib dates back to June 2012; CHADS2-VASc score 2; candidate for dual  antiplatelet therapy based on active C and active S STUDIES  for thromboembolic prophyaxis--actually ot on coumadin but on aspirin ad plavix  . Chronic insomnia   . Chronically elevated hemidiaphragm    Left  . Dyslipidemia   . Dysrhythmia   . Former light tobacco smoker     smoked a pipe; quit 25+ years ago   . Hypertension   . Obesity (BMI 30.0-34.9)   . 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 Venous stasis of both lower extremities    with edema     Objective/Physical Exam General: The patient is alert and oriented x3 in no acute distress.  Dermatology:  Wound #1 noted to the right fourth toe measuring 0.6 x 0.6 x 0.2 cm (LxWxD).   To the noted ulceration(s), there is no eschar. There is a moderate amount of slough, fibrin, and necrotic tissue noted. Granulation tissue and wound base is red. There is a minimal amount of serosanguineous drainage noted. There is no exposed bone muscle-tendon ligament or joint. There is no malodor. Periwound integrity is intact. Skin is warm, dry and supple bilateral lower extremities.  Vascular: Palpable pedal pulses bilaterally. Mild edema noted. Capillary refill within normal limits. Varicosities noted bilateral lower extremities.   Neurological: Epicritic and protective threshold diminished  bilaterally.   Musculoskeletal Exam: Hammertoe contracture deformity noted to digits 4 and 5 of the right foot. Range of motion within normal limits to all pedal and ankle joints bilateral. Muscle strength 5/5 in all groups bilateral.   Radiographic Exam: Hammertoe contracture deformity noted to the interphalangeal joints and MPJ of the respective hammertoe digits mentioned on clinical musculoskeletal exam.  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 Patient was evaluated. X-Rays reviewed.  2. Today we discussed the conservative versus surgical management of the presenting pathology. The patient opts for surgical management. All possible complications and details of the procedure were explained. All patient questions were answered. No guarantees were expressed or implied. 3. Authorization for surgery was initiated today. Surgery will consist of PIPJ arthroplasty with derotational skin plasty digits 4, 5 right.  4. Prescription for Doxycycline #20 provided to patient. Patient recently underwent right knee replacement 4 weeks ago.  5. Return to clinic one week post op.     Real estate agent.    Edrick Kins, DPM [facility]  Dr. Edrick Kins, DPM    [address], [place]  [place] [phone]  Fax [phone]

## 2019-04-29 ENCOUNTER — Telehealth: Payer: Self-pay | Admitting: *Deleted

## 2019-04-29 ENCOUNTER — Other Ambulatory Visit: Payer: Self-pay | Admitting: Cardiology

## 2019-04-29 NOTE — Telephone Encounter (Signed)
"  I have surgery on Monday at 12:30 pm.  I"m was checking to see if I need to stop my Eliquis and my aspirin before the surgery."

## 2019-04-30 NOTE — Telephone Encounter (Signed)
I left Alex Alvarez a message that Dr. Amalia Hailey said he can stop the Eliquis today and start it again after the surgery.

## 2019-05-03 ENCOUNTER — Encounter: Payer: Self-pay | Admitting: Podiatry

## 2019-05-03 ENCOUNTER — Other Ambulatory Visit: Payer: Self-pay | Admitting: Podiatry

## 2019-05-03 DIAGNOSIS — M2041 Other hammer toe(s) (acquired), right foot: Secondary | ICD-10-CM

## 2019-05-03 MED ORDER — OXYCODONE-ACETAMINOPHEN 5-325 MG PO TABS: 1.0000 | ORAL_TABLET | Freq: Four times a day (QID) | ORAL | 0 refills | Status: DC | PRN

## 2019-05-03 NOTE — Progress Notes (Signed)
.  postop

## 2019-05-12 ENCOUNTER — Ambulatory Visit (INDEPENDENT_AMBULATORY_CARE_PROVIDER_SITE_OTHER): Payer: Medicare Other

## 2019-05-12 ENCOUNTER — Encounter: Payer: Self-pay | Admitting: Podiatry

## 2019-05-12 ENCOUNTER — Other Ambulatory Visit: Payer: Self-pay

## 2019-05-12 ENCOUNTER — Ambulatory Visit (INDEPENDENT_AMBULATORY_CARE_PROVIDER_SITE_OTHER): Payer: Self-pay | Admitting: Podiatry

## 2019-05-12 VITALS — BP 137/80 | HR 74 | Temp 97.5°F

## 2019-05-12 DIAGNOSIS — Z09 Encounter for follow-up examination after completed treatment for conditions other than malignant neoplasm: Secondary | ICD-10-CM

## 2019-05-12 DIAGNOSIS — M2041 Other hammer toe(s) (acquired), right foot: Secondary | ICD-10-CM | POA: Diagnosis not present

## 2019-05-12 DIAGNOSIS — L97511 Non-pressure chronic ulcer of other part of right foot limited to breakdown of skin: Secondary | ICD-10-CM

## 2019-05-15 NOTE — Progress Notes (Signed)
   Subjective:  Patient presents today status post hammertoe repair of digits 4 and 5 right. DOS: 05/03/2019. He states he is doing well. He denies any pain or modifying factors. He states he has not needed to take any pain medications. He has been using the post op shoe as directed. Patient is here for further evaluation and treatment.    Past Medical History:  Diagnosis Date  . Bilateral lower extremity edema   . 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 Chronic atrial fibrillation 8/132014 office note   chronic afib dates back to June 2012; CHADS2-VASc score 2; candidate for dual  antiplatelet therapy based on active C and active S STUDIES  for thromboembolic prophyaxis--actually ot on coumadin but on aspirin ad plavix  . Chronic insomnia   . Chronically elevated hemidiaphragm    Left  . Dyslipidemia   . Dysrhythmia   . Former light tobacco smoker     smoked a pipe; quit 25+ years ago   . Hypertension   . Obesity (BMI 30.0-34.9)   . 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 Venous stasis of both lower extremities    with edema      Objective/Physical Exam Neurovascular status intact.  Skin incisions appear to be well coapted with sutures and staples intact. No sign of infectious process noted. No dehiscence. No active bleeding noted. Moderate edema noted to the surgical extremity.  Radiographic Exam:  Orthopedic hardware and osteotomies sites appear to be stable with routine healing.  Assessment: 1. s/p hammertoe repair digits 4 and 5 right. DOS: 05/03/2019   Plan of Care:  1. Patient was evaluated. X-rays reviewed 2. Dressing changed. Keep clean, dry and intact for one week.  3. Continue using post op shoe.  4. Return to clinic in one week.    Edrick Kins, DPM [facility]  Dr. Edrick Kins, [address]                                        [place], [place] [address]                 Office [phone]  Fax [phone]

## 2019-05-19 ENCOUNTER — Ambulatory Visit (INDEPENDENT_AMBULATORY_CARE_PROVIDER_SITE_OTHER): Payer: Medicare Other | Admitting: Podiatry

## 2019-05-19 ENCOUNTER — Other Ambulatory Visit: Payer: Self-pay

## 2019-05-19 DIAGNOSIS — M2041 Other hammer toe(s) (acquired), right foot: Secondary | ICD-10-CM

## 2019-05-19 DIAGNOSIS — Z09 Encounter for follow-up examination after completed treatment for conditions other than malignant neoplasm: Secondary | ICD-10-CM

## 2019-05-22 NOTE — Progress Notes (Signed)
   Subjective:  Patient presents today status post hammertoe repair of digits 4 and 5 right. DOS: 05/03/2019. He states he is doing well. He denies any significant pain or modifying factors. He has been using the post op shoe as directed. Patient is here for further evaluation and treatment.    Past Medical History:  Diagnosis Date  . Bilateral lower extremity edema   . 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 Chronic atrial fibrillation 8/132014 office note   chronic afib dates back to June 2012; CHADS2-VASc score 2; candidate for dual  antiplatelet therapy based on active C and active S STUDIES  for thromboembolic prophyaxis--actually ot on coumadin but on aspirin ad plavix  . Chronic insomnia   . Chronically elevated hemidiaphragm    Left  . Dyslipidemia   . Dysrhythmia   . Former light tobacco smoker     smoked a pipe; quit 25+ years ago   . Hypertension   . Obesity (BMI 30.0-34.9)   . 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 Venous stasis of both lower extremities    with edema      Objective/Physical Exam Neurovascular status intact.  Skin incisions appear to be well coapted with sutures and staples intact. No sign of infectious process noted. No dehiscence. No active bleeding noted. Moderate edema noted to the surgical extremity.  Assessment: 1. s/p hammertoe repair digits 4 and 5 right. DOS: 05/03/2019   Plan of Care:  1. Patient was evaluated.  2. Sutures removed.  3. Discontinue using post op shoe.  4. Recommended good shoe gear.  5. Return to clinic in 4 weeks for final follow up X-Ray.   Edrick Kins, DPM [facility]  Dr. Edrick Kins, [address]                                        [place], [place] [address]                Office [phone]  Fax [phone]

## 2019-06-16 ENCOUNTER — Other Ambulatory Visit: Payer: Medicare Other | Admitting: Podiatry

## 2019-07-07 ENCOUNTER — Ambulatory Visit: Payer: Medicare Other

## 2019-07-07 ENCOUNTER — Ambulatory Visit (INDEPENDENT_AMBULATORY_CARE_PROVIDER_SITE_OTHER): Payer: Self-pay | Admitting: Podiatry

## 2019-07-07 ENCOUNTER — Ambulatory Visit (INDEPENDENT_AMBULATORY_CARE_PROVIDER_SITE_OTHER): Payer: Medicare Other

## 2019-07-07 ENCOUNTER — Other Ambulatory Visit: Payer: Self-pay

## 2019-07-07 DIAGNOSIS — M2041 Other hammer toe(s) (acquired), right foot: Secondary | ICD-10-CM | POA: Diagnosis not present

## 2019-07-07 DIAGNOSIS — Z09 Encounter for follow-up examination after completed treatment for conditions other than malignant neoplasm: Secondary | ICD-10-CM

## 2019-07-12 NOTE — Progress Notes (Signed)
   Subjective:  Patient presents today status post hammertoe repair of digits 4 and 5 right. DOS: 05/03/2019. He states he is doing well. He denies any significant pain or modifying factors. He has been wearing good shoes as instructed. Patient is here for further evaluation and treatment.    Past Medical History:  Diagnosis Date  . Bilateral lower extremity edema   . 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 Chronic atrial fibrillation 8/132014 office note   chronic afib dates back to June 2012; CHADS2-VASc score 2; candidate for dual  antiplatelet therapy based on active C and active S STUDIES  for thromboembolic prophyaxis--actually ot on coumadin but on aspirin ad plavix  . Chronic insomnia   . Chronically elevated hemidiaphragm    Left  . Dyslipidemia   . Dysrhythmia   . Former light tobacco smoker     smoked a pipe; quit 25+ years ago   . Hypertension   . Obesity (BMI 30.0-34.9)   . 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 Venous stasis of both lower extremities    with edema      Objective/Physical Exam Neurovascular status intact.  Skin incisions appear to be well coapted. No sign of infectious process noted. No dehiscence. No active bleeding noted. Moderate edema noted to the surgical extremity.  Radiographic Exam:  Orthopedic hardware and osteotomies sites appear to be stable with routine healing.  Assessment: 1. s/p hammertoe repair digits 4 and 5 right. DOS: 05/03/2019   Plan of Care:  1. Patient was evaluated. X-Rays reviewed.  2. May resume full activity with no restrictions.  3. Recommended good shoe gear.  4. Return to clinic as needed.   Edrick Kins, DPM [facility]  Dr. Edrick Kins, [address]                                        [place], [place] [address]                Office [phone]  Fax [phone]

## 2019-07-20 ENCOUNTER — Other Ambulatory Visit: Payer: Self-pay | Admitting: Cardiology

## 2019-08-13 IMAGING — DX PORTABLE RIGHT KNEE - 1-2 VIEW
2 series · 2 of 2 positions shown · non-contrast
Comparison: None.

CLINICAL DATA: Knee arthroplasty

EXAM:
PORTABLE RIGHT KNEE - 1-2 VIEW

[knee lat]
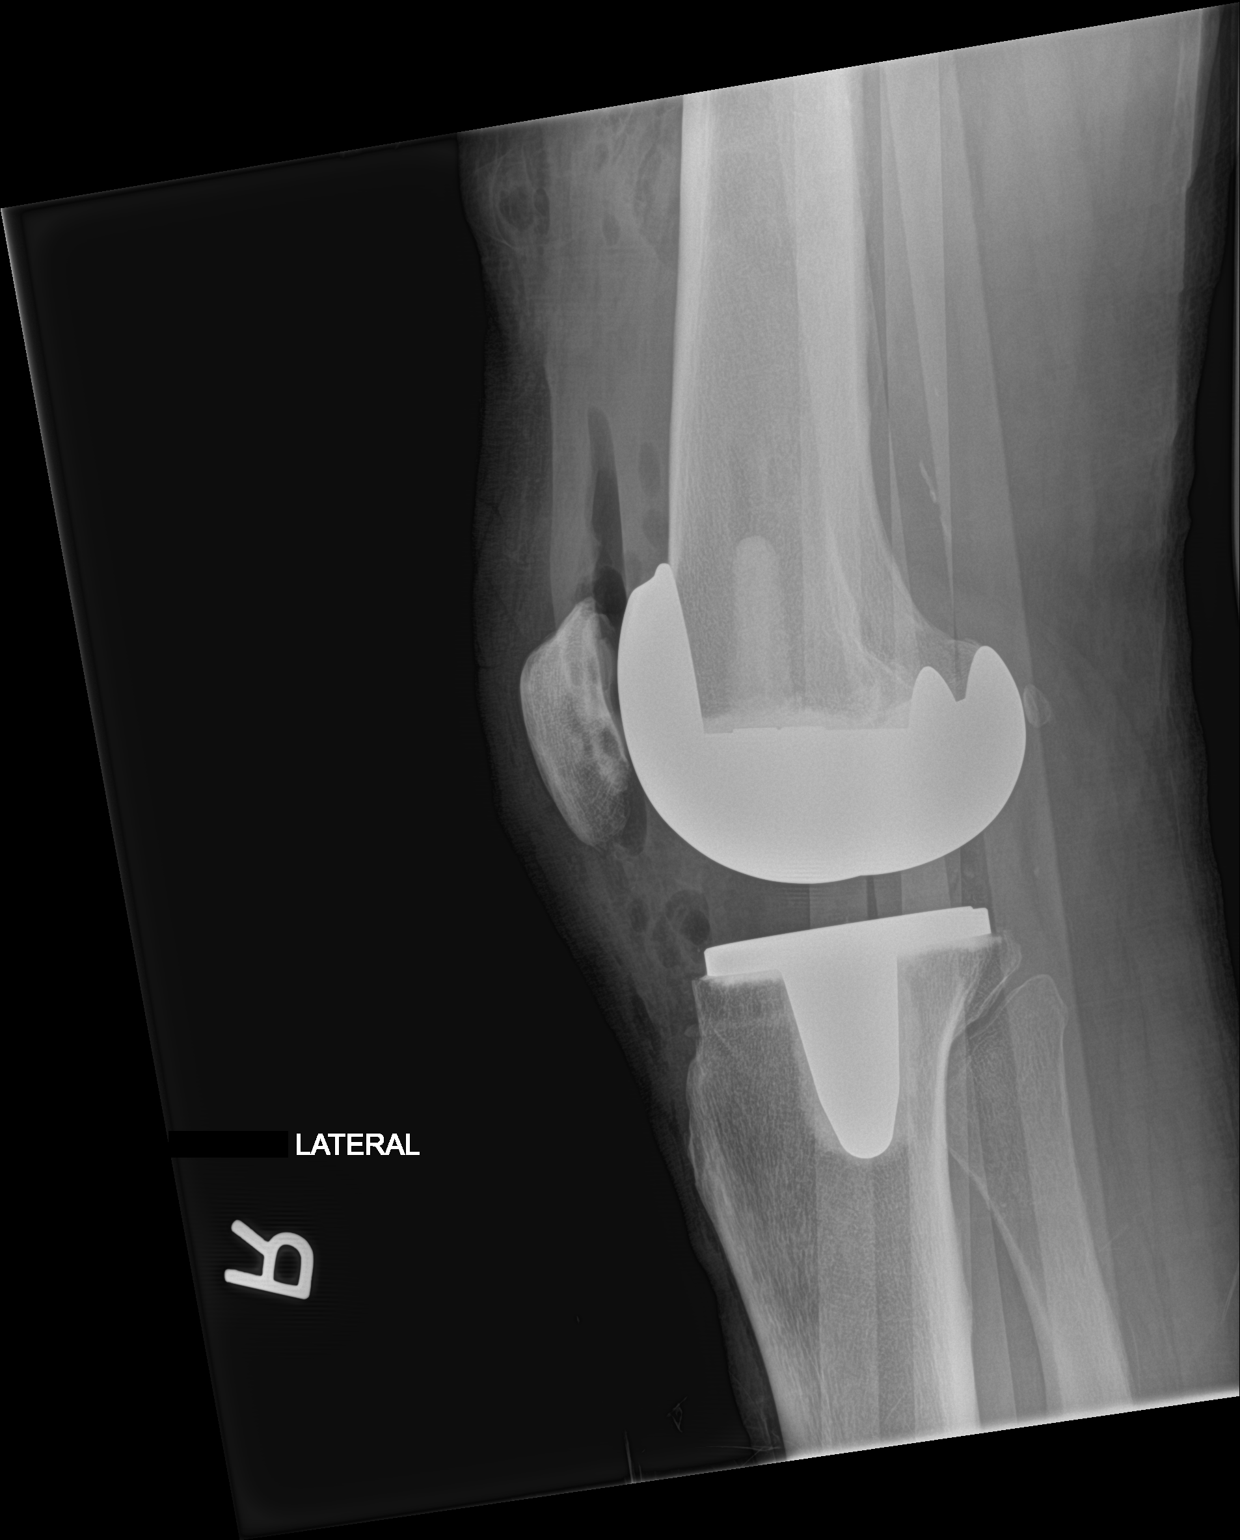

[knee ap]
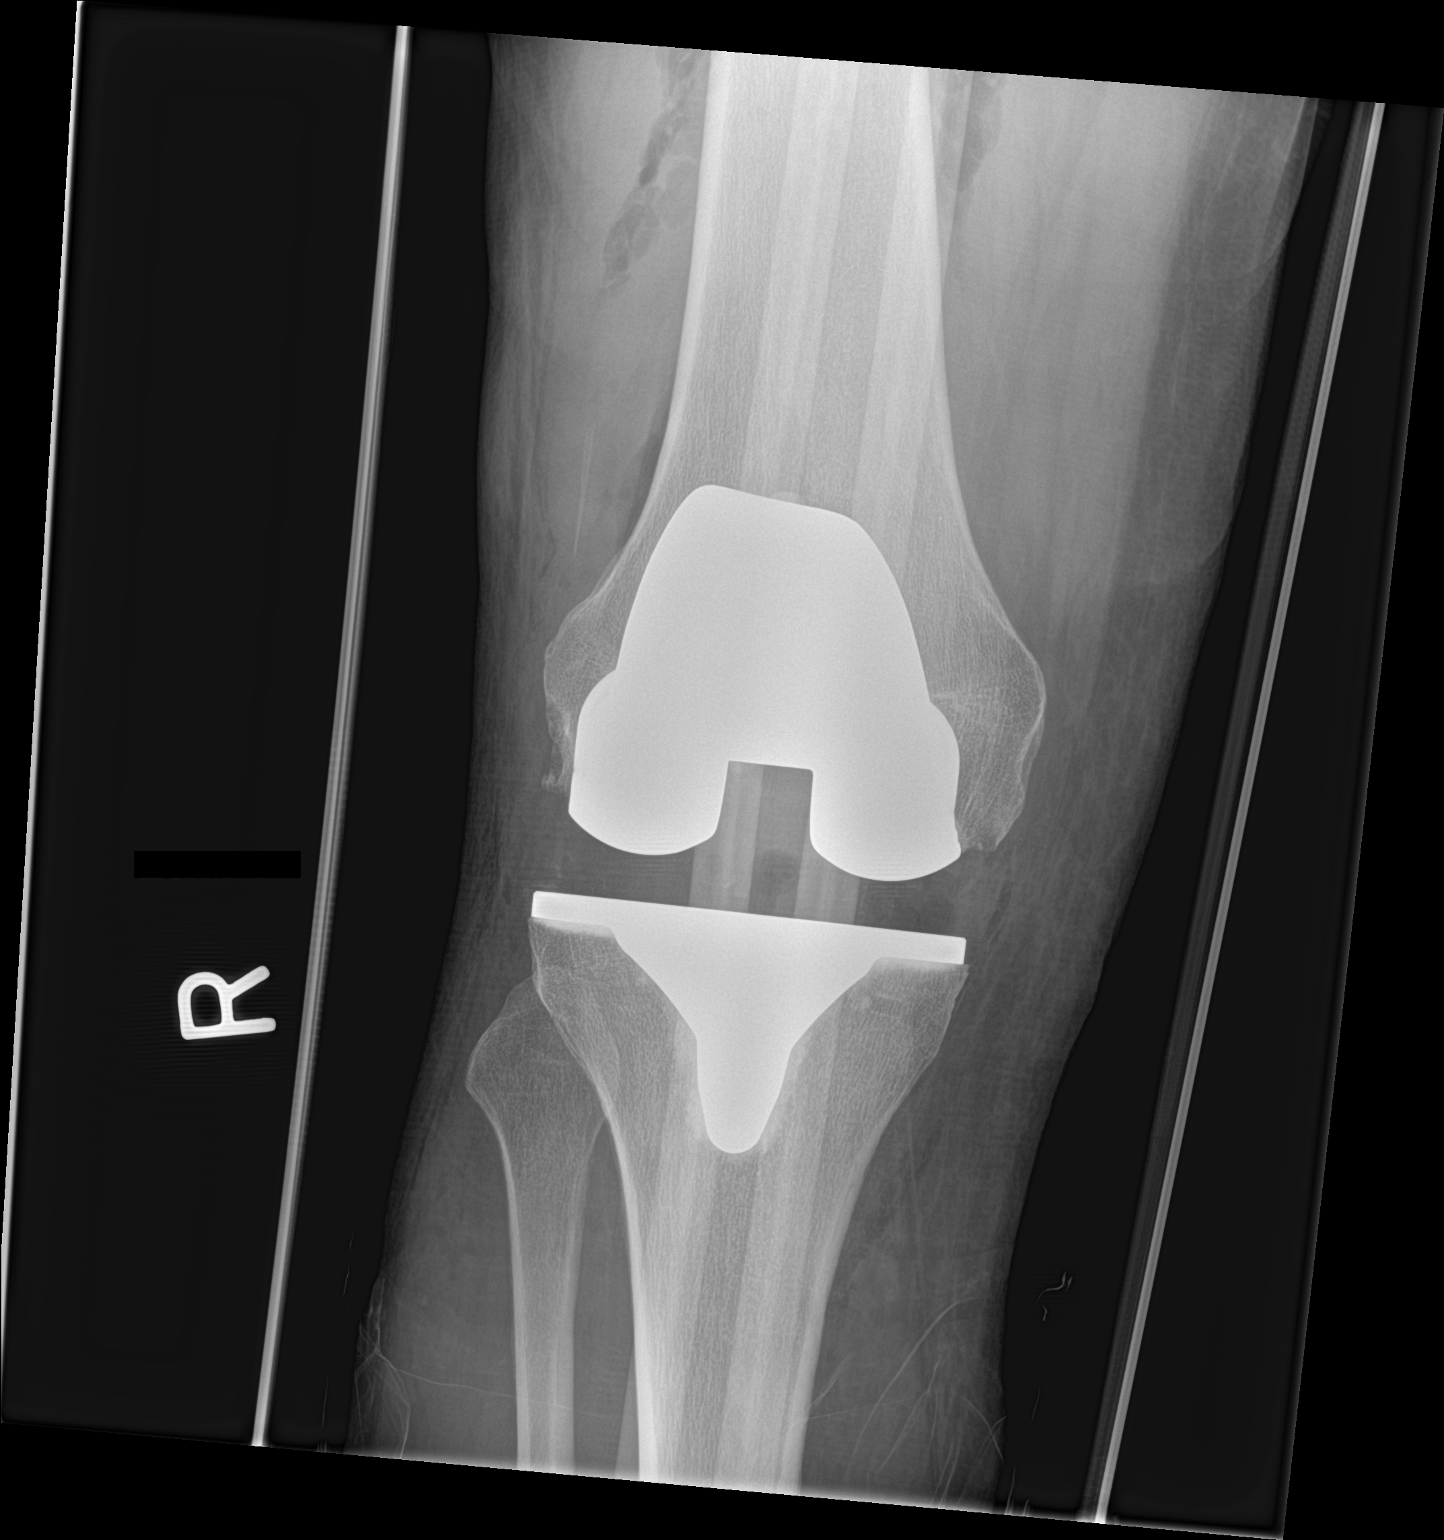

[2 of 2 positions shown; findings below may reference images not displayed]

FINDINGS: Total knee arthroplasty with swelling and gas. The prosthesis is
well seated and the knee is located.
IMPRESSION: Total knee arthroplasty without complicating features.

## 2019-08-24 ENCOUNTER — Encounter: Payer: Self-pay | Admitting: Adult Health

## 2019-08-24 ENCOUNTER — Other Ambulatory Visit: Payer: Self-pay

## 2019-08-24 ENCOUNTER — Ambulatory Visit: Payer: Medicare Other | Admitting: Adult Health

## 2019-08-24 VITALS — BP 138/99 | HR 70 | Ht 71.0 in | Wt 241.0 lb

## 2019-08-24 DIAGNOSIS — I4819 Other persistent atrial fibrillation: Secondary | ICD-10-CM | POA: Diagnosis not present

## 2019-08-24 DIAGNOSIS — E785 Hyperlipidemia, unspecified: Secondary | ICD-10-CM

## 2019-08-24 DIAGNOSIS — I10 Essential (primary) hypertension: Secondary | ICD-10-CM | POA: Diagnosis not present

## 2019-08-24 MED ORDER — IRBESARTAN-HYDROCHLOROTHIAZIDE 150-12.5 MG PO TABS: 2.0000 | ORAL_TABLET | Freq: Every day | ORAL | 3 refills | Status: DC

## 2019-08-24 NOTE — Patient Instructions (Signed)
Medication Instructions:  INCREASE- Avalide(Irbesartan/HCTz) 300/25 mg by mouth daily  If you need a refill on your cardiac medications before your next appointment, please call your pharmacy.  Labwork: None Ordered   Testing/Procedures: None Ordered  PLEASE READ AND FOLLOW SALTY 6 ATTACHED  Reduce your risk of getting COVID-19 With your heart disease it is especially important for people at increased risk of severe illness from COVID-19, and those who live with them, to protect themselves from getting COVID-19. The best way to protect yourself and to help reduce the spread of the virus that causes COVID-19 is to: [name] Limit your interactions with other people as much as possible. . Take precautions to prevent getting COVID-19 when you do interact with others. If you start feeling sick and think you may have COVID-19, get in touch with your healthcare provider within 24 hours.  Follow-Up: IN 6 months Please call our office 2 months in advance, to schedule this appointment. In Person Glenetta Hew, MD.    At [facility], you and your health needs are our priority.  As part of our continuing mission to provide you with exceptional heart care, we have created designated Provider Care Teams.  These Care Teams include your primary Cardiologist (physician) and Advanced Practice Providers (APPs -  Physician Assistants and Nurse Practitioners) who all work together to provide you with the care you need, when you need it.  Thank you for choosing [facility]!!     Happy Holidays!!

## 2019-08-24 NOTE — Progress Notes (Signed)
Cardiology Office Note   Date:  08/24/2019   ID:  Alex Alvarez, DOB 1946-08-09, MRN [id number]  PCP:  Christain Sacramento, MD  Cardiologist:  Dr. Ellyn Hack  Follow up   History of Present Illness: Alex Alvarez is a 73 y.o. male who presents for ongoing assessment and management of atrial fibrillation with history of hypertension, chronic lower extremity edema, hyperlipidemia.  He was last seen in the office on 03/02/2019 for preoperative evaluation have total knee replacement.  This was a telemedicine visit.  At that time the patient was without complaints of dyspnea, PND, orthopnea, chest pressure, or breast pain from spironolactone.  He was medically compliant.  Heart rate was well controlled on medication regimen.  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  His wife was hospitalized at the [facility], for almost 2 weeks.  They have both since recovered.  He states that his wife had a more severe version than himself.  She was close to intubation that began to recover prior to that.  For both are continuing to have some side effects with brain fog and neurologic discomfort.  He states he is much stronger than she is right now.  He denies any palpitations, dyspnea on exertion, recurrent chest pain, or significant fatigue.  He has been medically compliant.  He has noticed that his blood pressures been a little more elevated lately.  Past Medical History:  Diagnosis Date  . Bilateral lower extremity edema   . 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 Chronic atrial fibrillation ([place]) 8/132014 office note   chronic afib dates back to June 2012; CHADS2-VASc score 2; candidate for dual  antiplatelet therapy based on active C and active S STUDIES  for thromboembolic prophyaxis--actually ot on coumadin but on aspirin ad plavix  . Chronic insomnia   . Chronically elevated hemidiaphragm    Left  . Dyslipidemia     . Dysrhythmia   . Former light tobacco smoker     smoked a pipe; quit 25+ years ago   . Hypertension   . Obesity (BMI 30.0-34.9)   . 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 Venous stasis of both lower extremities    with edema    Past Surgical History:  Procedure Laterality Date  . Aorta doppler  11/12/2012   normal abdomnal aortic duplex   . JOINT REPLACEMENT Left Jul 07, 2015  . NM MYOVIEW LTD  07/30/2011   non-gated study seconadry to a fib;exercise capcity 7 mets--mildly reduced;low risk scan  . SKIN BIOPSY N/A sept 2016   Basal cell from face.  . TONSILLECTOMY    . TOTAL KNEE ARTHROPLASTY Left 07/07/2015   Procedure: LEFT TOTAL KNEE ARTHROPLASTY;  Surgeon: Netta Cedars, MD;  Location: [facility];  Service: Orthopedics;  Laterality: Left;  . TOTAL KNEE ARTHROPLASTY Right 03/26/2019   Procedure: TOTAL KNEE ARTHROPLASTY;  Surgeon: Netta Cedars, MD;  Location: [facility];  Service: Orthopedics;  Laterality: Right;  . TRANSTHORACIC ECHOCARDIOGRAM  02/22/2011   EF 50-55%; with mild inferior wall hypokinesis, mild left atrial enlargement of 4.2cm  . TRANSTHORACIC ECHOCARDIOGRAM  11/12/2012   afib; EF 50-55% (CRO WMA b/c Afib - difficult gaiting). AoV sclerosis without stenosis     Current Outpatient Medications  Medication Sig Dispense Refill  . aspirin EC 81 MG tablet Take 81 mg by mouth daily.     [name]  atorvastatin (LIPITOR) 10 MG tablet Take 1 tablet by mouth  daily 90 tablet 3  . carboxymethylcellulose (REFRESH PLUS) 0.5 % SOLN Place 1 drop into both eyes 2 (two) times daily as needed (dry eyes).    [name] diclofenac (VOLTAREN) 75 MG EC tablet Take 75 mg by mouth 2 (two) times daily.     [name] diltiazem (CARDIZEM CD) 240 MG 24 hr capsule Take 240 mg by mouth daily.     [name] ELIQUIS 5 MG TABS tablet TAKE 1 TABLET BY MOUTH TWICE A DAY 180 tablet 1  . EPINEPHrine (EPIPEN 2-PAK) 0.3 mg/0.3 mL IJ SOAJ injection Inject 0.3 mg into the muscle as needed for anaphylaxis.     .  methocarbamol (ROBAXIN) 500 MG tablet Take 1 tablet (500 mg total) by mouth every 8 (eight) hours as needed. 40 tablet 1  . Multiple Vitamins-Minerals (MULTIVITAMIN WITH MINERALS) tablet Take 1 tablet by mouth daily.    [name] spironolactone (ALDACTONE) 25 MG tablet TAKE ONE-HALF TABLET BY  MOUTH DAILY 45 tablet 1  . traMADol (ULTRAM) 50 MG tablet Take 1-2 tablets (50-100 mg total) by mouth every 6 (six) hours as needed for moderate pain or severe pain. 40 tablet 0  . traZODone (DESYREL) 100 MG tablet Take 1/2 to one tablet at bedtime for insomnia    . irbesartan-hydrochlorothiazide (AVALIDE) 150-12.5 MG tablet Take 2 tablets by mouth daily. 180 tablet 3   No current facility-administered medications for this visit.    Allergies:   Copper, Lipitor [atorvastatin], and Terbinafine    Social History:  The patient  reports that he quit smoking about 7 years ago. His smoking use included pipe. He has never used smokeless tobacco. He reports that he does not drink alcohol or use drugs.   Family History:  The patient's family history includes Cancer in his maternal grandfather; Cancer - Ovarian in his mother.    ROS: All other systems are reviewed and negative. Unless otherwise mentioned in H&P    PHYSICAL EXAM: VS:  BP (!) 138/99   Pulse 70   Ht 5\' 11"  (1.803 m)   Wt 241 lb (109.3 kg)   SpO2 98%   BMI 33.61 kg/m  , BMI Body mass index is 33.61 kg/m. GEN: Well nourished, well developed, in no acute distress HEENT: normal Neck: no JVD, carotid bruits, or masses Cardiac: RRR; no murmurs, rubs, or gallops,no edema  Respiratory:  Clear to auscultation bilaterally, normal work of breathing GI: soft, nontender, nondistended, + BS MS: no deformity or atrophy Skin: warm and dry, no rash Neuro:  Strength and sensation are intact Psych: euthymic mood, full affect   EKG: Not completed this office visit Recent Labs: 03/28/2019: BUN 24; Creatinine, Ser 1.09; Hemoglobin 13.2; Platelets 128;  Potassium 4.0; Sodium 133    Lipid Panel No results found for: CHOL, TRIG, HDL, CHOLHDL, VLDL, LDLCALC, LDLDIRECT    Wt Readings from Last 3 Encounters:  08/24/19 241 lb (109.3 kg)  03/24/19 237 lb (107.5 kg)  03/02/19 234 lb (106.1 kg)      Other studies Reviewed: Echocardiogram December 11, 2018 Left ventricle: The cavity size was normal. Systolic  function was normal. The estimated ejection fraction was  in the range of 50% to 55%. Regional wall motion  abnormalities cannot be excluded. The study is not  technically sufficient to allow evaluation of LV diastolic  function.  - Aortic valve: Trivial regurgitation.  - Mitral valve: Mild regurgitation.  - Atrial septum: No defect or patent foramen ovale was  identified.    ASSESSMENT AND PLAN:  1.  Hypertension: He has been noticing that his blood pressure has been running a little higher than he normally runs over the last month or 2.  He is noticing this especially with his diastolic numbers.  I have rechecked it and found that his diastolic reading was 99991111.  I will increase his medication regimen of irbesartan HCTZ from 300/12.5-3 100/25 mg daily.  He will continue to monitor his blood pressure control and reduce his salt intake.  Would not like to increase his spironolactone at this time unless necessary.  2. Persistent Atrial fibrillation: Heart rate is well controlled and regular on auscultation. Continue diltiazem and Eliquis. No complaints of bleeding.   3. Dyslipidemia: Continue atorvastatin daily. Follow up labs with PCP.    Current medicines are reviewed at length with the patient today.    Labs/ tests ordered today include:  Phill Myron. [place] Pugh, ANP, AACC   08/24/2019 2:38 PM    [address] Office [phone] Fax [phone]  Notice: This dictation was prepared with Dragon dictation along with smaller phrase technology. Any transcriptional  errors that result from this process are unintentional and may not be corrected upon review.

## 2019-09-20 ENCOUNTER — Other Ambulatory Visit: Payer: Self-pay

## 2019-09-20 MED ORDER — IRBESARTAN-HYDROCHLOROTHIAZIDE 150-12.5 MG PO TABS: 2.0000 | ORAL_TABLET | Freq: Every day | ORAL | 3 refills | Status: DC

## 2019-09-24 ENCOUNTER — Encounter: Payer: Self-pay | Admitting: Cardiology

## 2019-11-25 ENCOUNTER — Other Ambulatory Visit: Payer: Self-pay

## 2019-11-25 MED ORDER — APIXABAN 5 MG PO TABS: 5.0000 mg | ORAL_TABLET | Freq: Two times a day (BID) | ORAL | 1 refills | Status: DC

## 2019-12-05 ENCOUNTER — Other Ambulatory Visit: Payer: Self-pay | Admitting: Adult Health

## 2019-12-17 ENCOUNTER — Other Ambulatory Visit: Payer: Self-pay

## 2019-12-24 ENCOUNTER — Telehealth: Payer: Self-pay | Admitting: Cardiology

## 2019-12-24 MED ORDER — HYDROCHLOROTHIAZIDE 25 MG PO TABS: 25.0000 mg | ORAL_TABLET | Freq: Every day | ORAL | 3 refills | Status: DC

## 2019-12-24 MED ORDER — IRBESARTAN 300 MG PO TABS: 300.0000 mg | ORAL_TABLET | Freq: Every day | ORAL | 3 refills | Status: AC

## 2019-12-24 NOTE — Telephone Encounter (Signed)
Pt c/o medication issue:  1. Name of Medication: irbesartan-hydrochlorothiazide (AVALIDE) 150-12.5 MG tablet  2. How are you currently taking this medication (dosage and times per day)? 1 tablet by mouth daily   3. Are you having a reaction (difficulty breathing--STAT)? No   4. What is your medication issue? Alex Alvarez is calling stating his insurance only covered enough of this medication for him to take it once a day. [name] also states his insurance requested he call in to ask for a fluid pill prescription without irbesartan. Please advise.

## 2019-12-24 NOTE — Telephone Encounter (Signed)
I spoke with patient. He reports at last office visit irbesartan-HCTZ was increased and he was told to take 150/12.5 mg twice daily.  Insurance will not cover twice daily. My chart message from 4/15 indicates patient is to take Irbesartan 300 mg and HCTZ 25 mg daily. Patient states he was told he would need twice daily dosing of HCTZ to help with BP control.  Will send to K. Purcell Nails, NP to clarify what patient should be taking.  Prescription should be sent to [facility] in [place].

## 2019-12-24 NOTE — Telephone Encounter (Signed)
Pt notified. He is aware to stop combination irbesartan/HCTZ.  Will send prescriptions to [facility] in [place]

## 2019-12-24 NOTE — Telephone Encounter (Signed)
Please change his Rx to irbesaratan 300 mg daily and separate Rx for HCTZ 25 mg daily so that he can take both, instead of combination medication   Thank you.  Curt Bears

## 2019-12-28 ENCOUNTER — Other Ambulatory Visit: Payer: Self-pay | Admitting: Adult Health

## 2020-01-26 ENCOUNTER — Other Ambulatory Visit: Payer: Self-pay

## 2020-01-26 ENCOUNTER — Emergency Department (HOSPITAL_COMMUNITY): Payer: Medicare Other

## 2020-01-26 ENCOUNTER — Observation Stay (HOSPITAL_COMMUNITY)
Admission: EM | Admit: 2020-01-26 | Discharge: 2020-01-27 | Disposition: A | Discharge status: Home / Self Care | Payer: Medicare Other | Attending: Family Medicine | Admitting: Family Medicine

## 2020-01-26 ENCOUNTER — Encounter (HOSPITAL_COMMUNITY): Payer: Self-pay | Admitting: Emergency Medicine

## 2020-01-26 DIAGNOSIS — I10 Essential (primary) hypertension: Secondary | ICD-10-CM | POA: Insufficient documentation

## 2020-01-26 DIAGNOSIS — R001 Bradycardia, unspecified: Secondary | ICD-10-CM | POA: Diagnosis not present

## 2020-01-26 DIAGNOSIS — Z87891 Personal history of nicotine dependence: Secondary | ICD-10-CM | POA: Insufficient documentation

## 2020-01-26 DIAGNOSIS — Z79899 Other long term (current) drug therapy: Secondary | ICD-10-CM | POA: Insufficient documentation

## 2020-01-26 DIAGNOSIS — I951 Orthostatic hypotension: Secondary | ICD-10-CM | POA: Diagnosis not present

## 2020-01-26 DIAGNOSIS — Z85828 Personal history of other malignant neoplasm of skin: Secondary | ICD-10-CM | POA: Insufficient documentation

## 2020-01-26 DIAGNOSIS — G8929 Other chronic pain: Secondary | ICD-10-CM | POA: Insufficient documentation

## 2020-01-26 DIAGNOSIS — Z6832 Body mass index (BMI) 32.0-32.9, adult: Secondary | ICD-10-CM | POA: Insufficient documentation

## 2020-01-26 DIAGNOSIS — E669 Obesity, unspecified: Secondary | ICD-10-CM | POA: Diagnosis not present

## 2020-01-26 DIAGNOSIS — U071 COVID-19: Secondary | ICD-10-CM

## 2020-01-26 DIAGNOSIS — E785 Hyperlipidemia, unspecified: Secondary | ICD-10-CM | POA: Diagnosis not present

## 2020-01-26 DIAGNOSIS — Z96653 Presence of artificial knee joint, bilateral: Secondary | ICD-10-CM | POA: Diagnosis not present

## 2020-01-26 DIAGNOSIS — Z8589 Personal history of malignant neoplasm of other organs and systems: Secondary | ICD-10-CM | POA: Insufficient documentation

## 2020-01-26 DIAGNOSIS — Z7901 Long term (current) use of anticoagulants: Secondary | ICD-10-CM | POA: Diagnosis not present

## 2020-01-26 DIAGNOSIS — G4700 Insomnia, unspecified: Secondary | ICD-10-CM | POA: Diagnosis not present

## 2020-01-26 DIAGNOSIS — R55 Syncope and collapse: Secondary | ICD-10-CM

## 2020-01-26 DIAGNOSIS — N179 Acute kidney failure, unspecified: Secondary | ICD-10-CM | POA: Diagnosis not present

## 2020-01-26 DIAGNOSIS — I4819 Other persistent atrial fibrillation: Secondary | ICD-10-CM | POA: Diagnosis not present

## 2020-01-26 DIAGNOSIS — Z7982 Long term (current) use of aspirin: Secondary | ICD-10-CM | POA: Diagnosis not present

## 2020-01-26 DIAGNOSIS — Z8616 Personal history of COVID-19: Secondary | ICD-10-CM | POA: Insufficient documentation

## 2020-01-26 LAB — CBC WITH DIFFERENTIAL/PLATELET
Abs Immature Granulocytes: 0.01 10*3/uL (ref 0.00–0.07)
Basophils Absolute: 0 10*3/uL (ref 0.0–0.1)
Basophils Relative: 1 %
Eosinophils Absolute: 0.1 10*3/uL (ref 0.0–0.5)
Eosinophils Relative: 2 %
HCT: 37.5 % — ABNORMAL LOW (ref 39.0–52.0)
Hemoglobin: 12.4 g/dL — ABNORMAL LOW (ref 13.0–17.0)
Immature Granulocytes: 0 %
Lymphocytes Relative: 9 %
Lymphs Abs: 0.5 10*3/uL — ABNORMAL LOW (ref 0.7–4.0)
MCH: 34.4 pg — ABNORMAL HIGH (ref 26.0–34.0)
MCHC: 33.1 g/dL (ref 30.0–36.0)
MCV: 104.2 fL — ABNORMAL HIGH (ref 80.0–100.0)
Monocytes Absolute: 0.4 10*3/uL (ref 0.1–1.0)
Monocytes Relative: 8 %
Neutro Abs: 4.4 10*3/uL (ref 1.7–7.7)
Neutrophils Relative %: 80 %
Platelets: 148 10*3/uL — ABNORMAL LOW (ref 150–400)
RBC: 3.6 MIL/uL — ABNORMAL LOW (ref 4.22–5.81)
RDW: 11.4 % — ABNORMAL LOW (ref 11.5–15.5)
WBC: 5.4 10*3/uL (ref 4.0–10.5)
nRBC: 0 % (ref 0.0–0.2)

## 2020-01-26 LAB — HEPATIC FUNCTION PANEL
ALT: 28 U/L (ref 0–44)
AST: 22 U/L (ref 15–41)
Albumin: 3.5 g/dL (ref 3.5–5.0)
Alkaline Phosphatase: 55 U/L (ref 38–126)
Bilirubin, Direct: 0.1 mg/dL (ref 0.0–0.2)
Indirect Bilirubin: 0.6 mg/dL (ref 0.3–0.9)
Total Bilirubin: 0.7 mg/dL (ref 0.3–1.2)
Total Protein: 5.4 g/dL — ABNORMAL LOW (ref 6.5–8.1)

## 2020-01-26 LAB — CBG MONITORING, ED: Glucose-Capillary: 129 mg/dL — ABNORMAL HIGH (ref 70–99)

## 2020-01-26 LAB — URINALYSIS, ROUTINE W REFLEX MICROSCOPIC
Bilirubin Urine: NEGATIVE
Glucose, UA: NEGATIVE mg/dL
Hgb urine dipstick: NEGATIVE
Ketones, ur: NEGATIVE mg/dL
Leukocytes,Ua: NEGATIVE
Nitrite: NEGATIVE
Protein, ur: NEGATIVE mg/dL
Specific Gravity, Urine: 1.009 (ref 1.005–1.030)
pH: 5 (ref 5.0–8.0)

## 2020-01-26 LAB — SARS CORONAVIRUS 2 BY RT PCR (HOSPITAL ORDER, PERFORMED IN ~~LOC~~ HOSPITAL LAB): SARS Coronavirus 2: POSITIVE — AB

## 2020-01-26 LAB — BASIC METABOLIC PANEL
Anion gap: 6 (ref 5–15)
BUN: 23 mg/dL (ref 8–23)
CO2: 27 mmol/L (ref 22–32)
Calcium: 8.2 mg/dL — ABNORMAL LOW (ref 8.9–10.3)
Chloride: 98 mmol/L (ref 98–111)
Creatinine, Ser: 1.25 mg/dL — ABNORMAL HIGH (ref 0.61–1.24)
GFR calc Af Amer: >60 mL/min (ref >60)
GFR calc non Af Amer: 56 mL/min — ABNORMAL LOW (ref >60)
Glucose, Bld: 170 mg/dL — ABNORMAL HIGH (ref 70–99)
Potassium: 4.7 mmol/L (ref 3.5–5.1)
Sodium: 131 mmol/L — ABNORMAL LOW (ref 135–145)

## 2020-01-26 LAB — POC SARS CORONAVIRUS 2 AG -  ED: SARS Coronavirus 2 Ag: NEGATIVE

## 2020-01-26 LAB — TROPONIN I (HIGH SENSITIVITY)
Troponin I (High Sensitivity): 5 ng/L (ref <18)
Troponin I (High Sensitivity): 4 ng/L (ref <18)

## 2020-01-26 MED ORDER — ONDANSETRON HCL 4 MG/2ML IJ SOLN: 4.0000 mg | Freq: Four times a day (QID) | INTRAMUSCULAR | Status: DC | PRN

## 2020-01-26 MED ORDER — ASPIRIN EC 81 MG PO TBEC
81.0000 mg | DELAYED_RELEASE_TABLET | Freq: Every day | ORAL | Status: DC
  Administered 2020-01-26 – 2020-01-27 (×2): 81 mg via ORAL
  Filled 2020-01-26 (×2): qty 1

## 2020-01-26 MED ORDER — MAGNESIUM HYDROXIDE 400 MG/5ML PO SUSP: 30.0000 mL | Freq: Every day | ORAL | Status: DC | PRN

## 2020-01-26 MED ORDER — SENNA 8.6 MG PO TABS
1.0000 | ORAL_TABLET | Freq: Two times a day (BID) | ORAL | Status: DC
  Administered 2020-01-26 – 2020-01-27 (×2): 8.6 mg via ORAL
  Filled 2020-01-26 (×2): qty 1

## 2020-01-26 MED ORDER — TRAZODONE HCL 50 MG PO TABS
50.0000 mg | ORAL_TABLET | Freq: Every evening | ORAL | Status: DC | PRN
  Administered 2020-01-27: 50 mg via ORAL
  Filled 2020-01-26: qty 1

## 2020-01-26 MED ORDER — APIXABAN 5 MG PO TABS
5.0000 mg | ORAL_TABLET | Freq: Two times a day (BID) | ORAL | Status: DC
  Administered 2020-01-26 – 2020-01-27 (×2): 5 mg via ORAL
  Filled 2020-01-26 (×2): qty 1

## 2020-01-26 MED ORDER — ONDANSETRON HCL 4 MG PO TABS: 4.0000 mg | ORAL_TABLET | Freq: Four times a day (QID) | ORAL | Status: DC | PRN

## 2020-01-26 MED ORDER — SODIUM CHLORIDE 0.9% FLUSH
3.0000 mL | Freq: Two times a day (BID) | INTRAVENOUS | Status: DC
  Administered 2020-01-26 – 2020-01-27 (×2): 3 mL via INTRAVENOUS

## 2020-01-26 MED ORDER — SORBITOL 70 % SOLN: 30.0000 mL | Freq: Every day | Status: DC | PRN

## 2020-01-26 MED ORDER — ATORVASTATIN CALCIUM 10 MG PO TABS
10.0000 mg | ORAL_TABLET | Freq: Every day | ORAL | Status: DC
  Administered 2020-01-27: 10 mg via ORAL
  Filled 2020-01-26: qty 1

## 2020-01-26 MED ORDER — SODIUM CHLORIDE 0.9 % IV BOLUS
1000.0000 mL | Freq: Once | INTRAVENOUS | Status: AC
  Administered 2020-01-26: 1000 mL via INTRAVENOUS

## 2020-01-26 MED ORDER — SODIUM CHLORIDE 0.9 % IV SOLN: INTRAVENOUS | Status: DC

## 2020-01-26 NOTE — ED Notes (Signed)
Report called for pt transfer to the floor  

## 2020-01-26 NOTE — H&P (Signed)
[facility] History and Physical  Alex Alvarez [id number] DOB: 04/04/1946 DOA: 01/26/2020 PCP: Christain Sacramento, MD  Admitted from: Home Chief Complaint: Syncope    History of Present Illness: Alex Alvarez is a 74 y.o. male with PMH significant for HTN, HLD, chronic A. fib, bilateral lower extremity venous stasis who lives at home with his wife. This morning, patient was in ICU as a visitor to his wife who is intubated.  While sitting on a chair, he had a syncopal episode and hence sent to the ED.  On 5/20, his wife was hospitalized for difficulty breathing and currently remains intubated in ICU. For last 3 to 4 days, patient thinks he has not been good at maintaining his appetite and hydration. Yesterday, he felt weak waking up in the morning.  He felt dizzy on changing his posture. This morning he felt dizzy again.  He was aware of dehydration and was trying to drink enough water.  He has been compliant with his medications including diuretics. While seated in a recliner in his wife's ICU room, patient felt dizzy and passed out witnessed by speech therapist. Rapid response was called.  His heart rate was noted to be 39 in the EKG with no discernible P waves.  His blood pressure was low at 77/53.  Oxygen saturation 90% room air. He regained consciousness in few minutes. He was given fluid bolus and transferred to ED.  In the ED, he was afebrile, heart rate persistently low at 40s.  Blood pressure as low as 85/71, later improved to a max of 117/75.  He was breathing on room air. Chemistry showed sodium low at 131, glucose 170, BUN/creatinine 23/1.25 (baseline 1.09 in July 2020). CBC with normal WBC, hemoglobin 12.4, platelet 148 Surprisingly Covid PCR came back positive.  Chest x-ray normal.  Hospital service was consulted for further evaluation management. At the time of my evaluation, patient was properly drowsy.  Not in distress. Patient states patient and his wife both  had Covid infection in October 2020.  He did not require hospitalization. Both of them got vaccine in April.  Since his diagnosis of Covid, patient has persistent sinusitis symptoms but no lower respiratory symptoms.   Review of Systems:  All systems were reviewed and were negative unless otherwise mentioned in the HPI   Past medical history: Past Medical History:  Diagnosis Date  . Bilateral lower extremity edema   . 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 Chronic atrial fibrillation ([place]) 8/132014 office note   chronic afib dates back to June 2012; CHADS2-VASc score 2; candidate for dual  antiplatelet therapy based on active C and active S STUDIES  for thromboembolic prophyaxis--actually ot on coumadin but on aspirin ad plavix  . Chronic insomnia   . Chronically elevated hemidiaphragm    Left  . Dyslipidemia   . Dysrhythmia   . Former light tobacco smoker     smoked a pipe; quit 25+ years ago   . Hypertension   . Obesity (BMI 30.0-34.9)   . 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 Venous stasis of both lower extremities    with edema    Past surgical history: Past Surgical History:  Procedure Laterality Date  . Aorta doppler  11/12/2012   normal abdomnal aortic duplex   . JOINT REPLACEMENT Left Jul 07, 2015  . NM MYOVIEW LTD  07/30/2011   non-gated study  seconadry to a fib;exercise capcity 7 mets--mildly reduced;low risk scan  . SKIN BIOPSY N/A sept 2016   Basal cell from face.  . TONSILLECTOMY    . TOTAL KNEE ARTHROPLASTY Left 07/07/2015   Procedure: LEFT TOTAL KNEE ARTHROPLASTY;  Surgeon: Netta Cedars, MD;  Location: [place];  Service: Orthopedics;  Laterality: Left;  . TOTAL KNEE ARTHROPLASTY Right 03/26/2019   Procedure: TOTAL KNEE ARTHROPLASTY;  Surgeon: Netta Cedars, MD;  Location: [facility];  Service: Orthopedics;  Laterality: Right;  . TRANSTHORACIC ECHOCARDIOGRAM  02/22/2011   EF 50-55%; with mild  inferior wall hypokinesis, mild left atrial enlargement of 4.2cm  . TRANSTHORACIC ECHOCARDIOGRAM  11/12/2012   afib; EF 50-55% (CRO WMA b/c Afib - difficult gaiting). AoV sclerosis without stenosis    Social History:  reports that he quit smoking about 8 years ago. His smoking use included pipe. He has never used smokeless tobacco. He reports that he does not drink alcohol or use drugs.  Allergies:  Allergies  Allergen Reactions  . Copper Rash  . Lipitor [Atorvastatin] Other (See Comments)    Leg cramps  . Terbinafine Rash    Family history:  Family History  Problem Relation Age of Onset  . Cancer - Ovarian Mother   . Cancer Maternal Grandfather      Home Meds: Prior to Admission medications   Medication Sig Start Date End Date Taking? Authorizing Provider  aspirin EC 81 MG tablet Take 81 mg by mouth daily.     [provider]  atorvastatin (LIPITOR) 10 MG tablet Take 1 tablet by mouth  daily 04/04/16   Leonie Man, MD  carboxymethylcellulose (REFRESH PLUS) 0.5 % SOLN Place 1 drop into both eyes 2 (two) times daily as needed (dry eyes).    [provider]  diclofenac (VOLTAREN) 75 MG EC tablet Take 75 mg by mouth 2 (two) times daily.     [provider]  diltiazem (CARDIZEM CD) 240 MG 24 hr capsule Take 240 mg by mouth daily.  12/30/17   [provider]  ELIQUIS 5 MG TABS tablet TAKE 1 TABLET BY MOUTH TWICE A DAY 12/06/19   Lendon Colonel, NP  EPINEPHrine (EPIPEN 2-PAK) 0.3 mg/0.3 mL IJ SOAJ injection Inject 0.3 mg into the muscle as needed for anaphylaxis.     [provider]  hydrochlorothiazide (HYDRODIURIL) 25 MG tablet Take 1 tablet (25 mg total) by mouth daily. 12/24/19   Lendon Colonel, NP  irbesartan (AVAPRO) 300 MG tablet Take 1 tablet (300 mg total) by mouth daily. 12/24/19   Lendon Colonel, NP  methocarbamol (ROBAXIN) 500 MG tablet Take 1 tablet (500 mg total) by mouth every 8 (eight) hours as needed. 03/26/19    Netta Cedars, MD  Multiple Vitamins-Minerals (MULTIVITAMIN WITH MINERALS) tablet Take 1 tablet by mouth daily.    [provider]  spironolactone (ALDACTONE) 25 MG tablet TAKE ONE-HALF TABLET BY  MOUTH DAILY 12/30/19   Lendon Colonel, NP  traMADol (ULTRAM) 50 MG tablet Take 1-2 tablets (50-100 mg total) by mouth every 6 (six) hours as needed for moderate pain or severe pain. 03/26/19 03/25/20  Netta Cedars, MD  traZODone (DESYREL) 100 MG tablet Take 1/2 to one tablet at bedtime for insomnia 07/07/19   [provider]    Physical Exam: Vitals:   01/26/20 1230 01/26/20 1245 01/26/20 1300 01/26/20 1330  BP: 108/64 117/75 105/66 106/68  Pulse: (!) 48 (!) 49 (!) 51 (!) 47  Resp: 17 17 16  15  Temp:      TempSrc:      SpO2: 96% 97% 95% 99%  Weight:      Height:       Wt Readings from Last 3 Encounters:  01/26/20 107 kg  08/24/19 109.3 kg  03/24/19 107.5 kg   Body mass index is 32.92 kg/m.  General exam: Appears calm and comfortable.  Skin: No rashes, lesions or ulcers. HEENT: Atraumatic, normocephalic, supple neck, no obvious bleeding Lungs: Clear discussed bilaterally CVS: Bradycardic, no murmur GI/Abd soft, nontender, nondistended, bowel sound present CNS: Alert, awake, oriented x3 Psychiatry: Mood appropriate Extremities: No pedal edema, no calf tenderness     Consult Orders  (From admission, onward)         Start     Ordered   01/26/20 1127  Consult to hospitalist  ALL PATIENTS BEING ADMITTED/HAVING PROCEDURES NEED COVID-19 SCREENING  Once    Comments: ALL PATIENTS BEING ADMITTED/HAVING PROCEDURES NEED COVID-19 SCREENING  Provider:  (Not yet assigned)  Question Answer Comment  Place call to: [facility]   Reason for Consult Admit      01/26/20 1126          Labs on Admission:   CBC: Recent Labs  Lab 01/26/20 1014  WBC 5.4  NEUTROABS 4.4  HGB 12.4*  HCT 37.5*  MCV 104.2*  PLT 148*    Basic Metabolic Panel: Recent Labs   Lab 01/26/20 1014  NA 131*  K 4.7  CL 98  CO2 27  GLUCOSE 170*  BUN 23  CREATININE 1.25*  CALCIUM 8.2*    Liver Function Tests: Recent Labs  Lab 01/26/20 1014  AST 22  ALT 28  ALKPHOS 55  BILITOT 0.7  PROT 5.4*  ALBUMIN 3.5   No results for input(s): LIPASE, AMYLASE in the last 168 hours. No results for input(s): AMMONIA in the last 168 hours.  Cardiac Enzymes: No results for input(s): CKTOTAL, CKMB, CKMBINDEX, TROPONINI in the last 168 hours.  BNP (last 3 results) No results for input(s): BNP in the last 8760 hours.  ProBNP (last 3 results) No results for input(s): PROBNP in the last 8760 hours.  CBG: Recent Labs  Lab 01/26/20 0949  GLUCAP 129*    Lipase  No results found for: LIPASE   Urinalysis No results found for: COLORURINE, APPEARANCEUR, LABSPEC, PHURINE, GLUCOSEU, HGBUR, BILIRUBINUR, KETONESUR, PROTEINUR, UROBILINOGEN, NITRITE, LEUKOCYTESUR   Drugs of Abuse  No results found for: [place], [place], [place], [place], [facility], [place]    Radiological Exams on Admission: DG Chest Port 1 View  Result Date: 01/26/2020 CLINICAL DATA:  Syncope EXAM: PORTABLE CHEST 1 VIEW COMPARISON:  August 23, 2015 FINDINGS: There is elevation of the left hemidiaphragm with left base atelectasis. The lungs elsewhere are clear. Heart is mildly enlarged with pulmonary vascularity normal, stable. No adenopathy. No bone lesions. IMPRESSION: Stable elevation of the left hemidiaphragm with left base atelectasis. Lungs elsewhere clear. Stable cardiac silhouette. Electronically Signed   By: Lowella Grip III M.D.   On: 01/26/2020 10:27  ------------------------------------------------------------------------------------------------------ Assessment/Plan: Active Problems:   Orthostatic syncope  Orthostatic syncope History of essential hypertension -Patient's blood pressure is low with orthostatic drop likely secondary to poor oral intake and the use of diuretics.    -Start on IV fluid.   -Monitor orthostatic blood pressure -Home meds include HCTZ 25 mg daily, irbesartan 300 mg daily, Aldactone 12.5 mg daily, Cardizem 240 mg daily. -Keep all home meds on hold.  Bradycardia A. fib on meds -Persistently bradycardic  in 51s and 76s. -On Cardizem 240 mg daily at home. -I would keep it on hold, continue to monitor in telemetry and resume probably tomorrow at a lower dose. -Continue Eliquis for anticoagulation.  AKI -BUN/creatinine 23/1.25 (baseline 1.09 in July 2020). -Likely secondary to poor renal function secondary to bradycardia and hypotension.   -Expect to improve with IV fluid.  Keep diuretics, Voltaren on hold.  Covid PCR positive -Surprisingly Covid PCR done for screening is positive.  No respiratory symptoms. -Patient states patient and his wife both had Covid infection in October 2020.  He did not require hospitalization. Both of them got vaccine in April. Since his diagnosis of Covid, patient has persistent sinusitis symptoms but no lower respiratory symptoms. -It is unclear to me at this time if this needs to be considered as a true infection or just colonization.  And antigen test has been ordered.  HLD -Continue statin and aspirin.  ?Psych disorder/chronic pain -Home meds include trazodone 50 mg at bedtime, Ambien 10 mg at bedtime, Robaxin 500 mg 3 times daily as needed, tramadol 50 mg every 6 as needed. -Continue Ambien and tramadol.  Keep others on hold.  Mobility: Encourage ambulation.  Code Status:  Full code  DVT prophylaxis:  Remains on Eliquis Antimicrobials:  None Fluid: Normal saline  Diet: Cardiac diet  Consultants: None Family Communication:  Daughter at bedside  Status is: Observation  The patient remains OBS appropriate and will d/c before 2 midnights.  Dispo: The patient is from: Home              Anticipated d/c is to: Home              Anticipated d/c date is: 1 day              Patient currently is not  medically stable to d/c.  --------------------------------------------------------------------------------------------------------------------------- Severity of Illness: The appropriate patient status for this patient is OBSERVATION. Observation status is judged to be reasonable and necessary in order to provide the required intensity of service to ensure the patient's safety. The patient's presenting symptoms, physical exam findings, and initial radiographic and laboratory data in the context of their medical condition is felt to place them at decreased risk for further clinical deterioration. Furthermore, it is anticipated that the patient will be medically stable for discharge from the hospital within 2 midnights of admission. The following factors support the patient status of observation.   " The patient's presenting symptoms include syncope. " The physical exam findings include low blood pressure, bradycardia. " The initial radiographic and laboratory data are AKI.    -----------------------------------------------------------------------------------------------------  Cline Cools, MD [facility] Pager: [phone] (Secure Chat preferred). 01/26/2020

## 2020-01-26 NOTE — ED Triage Notes (Signed)
Pt was a visitor in ICU; was sitting in a chair with near syncope; low afib rhythm noted with ICU staff. Pt cbg 123 with ICU staff. Pt denies pain; no injury or fall with event.

## 2020-01-26 NOTE — ED Notes (Signed)
Pt does not have a pacemaker. Not completing orthostatic at present due to patient condition; pt is hypotensive.

## 2020-01-26 NOTE — Progress Notes (Signed)
Patient visiting his wife in the ICU and noted feeling faint while sitting in the recliner at bedside as well as dizy on his way in. Patient noted he had eaten a breakfast burrito and taken his normal daily medications. He was provided water by SLP at bedside. Patient had previously been participating in his wives swallow evaluation when SLP noted patient no longer responding. Patient sternal rubbed with no answer. Additional nursing staff called to bedside. Patient continued to be non-responsive. HR noted to be 39 on 5 lead EKG with no discernable P waves. Patient placed on Zoll monitor via crash cart. Patients wife noted he has history of Afib. Non diabetic but CBG checked noted to be 123. BP 77/53 at 0852. O2 80s on room air. O2 started via NRB. Patient diaphoretic but now arouseable. Remaining lethargic, pulses radially palpable but very faint. ED called for ED stretcher. 2 IV access obtained and 1L bolus started to gravity. Patient more alert now oriented with no recollection of event. Patient agreeable to transfer to emergency department for work up. BP now 90/58 at 0857. HR remaining in low 40s. Patient transported to ED via stretcher and report given to ED RN. Medication, ID card, and insurance card provided to ED RN. Patients wife updated on plan of care.

## 2020-01-26 NOTE — ED Notes (Signed)
Main phlebotomy called to collect second troponin.

## 2020-01-26 NOTE — ED Notes (Signed)
Patient given urinal. Made aware urine sample is needed.

## 2020-01-26 NOTE — ED Provider Notes (Signed)
[facility] DEPT Provider Note   CSN: [id number] Arrival date & time: 01/26/20  [id number]     History Chief Complaint  Patient presents with  . Near Syncope    Alex Alvarez is a 74 y.o. male.  Patient with syncopal event while sitting in a chair while visiting his wife in ICU.  No specific symptoms prior to passing out.  Did have something to drink right before hand.  Woke up not feeling that great.  Denies any chest pain or shortness of breath.  He states that yesterday he felt a little lightheaded when he changed positions but did not pass out.  Patient had no fall today.  This was a witnessed event.  Patient was started on IV fluids prior to my evaluation.  The history is provided by the patient.  Loss of Consciousness Episode history:  Single Most recent episode:  Today Progression:  Resolved Chronicity:  New Context: sitting down   Witnessed: yes   Relieved by:  Nothing Worsened by:  Nothing Associated symptoms: malaise/fatigue and weakness   Associated symptoms: no anxiety, no chest pain, no confusion, no diaphoresis, no difficulty breathing, no dizziness, no fever, no focal sensory loss, no headaches, no palpitations, no seizures, no shortness of breath and no vomiting   Risk factors comment:  Afib on blood thinner      Past Medical History:  Diagnosis Date  . Bilateral lower extremity edema   . 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 Chronic atrial fibrillation ([place]) 8/132014 office note   chronic afib dates back to June 2012; CHADS2-VASc score 2; candidate for dual  antiplatelet therapy based on active C and active S STUDIES  for thromboembolic prophyaxis--actually ot on coumadin but on aspirin ad plavix  . Chronic insomnia   . Chronically elevated hemidiaphragm    Left  . Dyslipidemia   . Dysrhythmia   . Former light tobacco smoker     smoked a pipe; quit 25+ years ago   . Hypertension    . Obesity (BMI 30.0-34.9)   . 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 Venous stasis of both lower extremities    with edema    Patient Active Problem List   Diagnosis Date Noted  . Osteoarthritis of right knee 03/28/2019  . Total knee replacement status 03/27/2019  . Status post total knee replacement, right 03/26/2019  . Pain in right knee 02/03/2019  . Trigger ring finger of left hand 07/12/2018  . MRSA (methicillin resistant staph aureus) culture positive 03/04/2018  . Skin ulcer of toe of right foot, limited to breakdown of skin ([place]) 03/02/2018  . Seasonal allergies 12/31/2017  . Toenail fungus 12/25/2016  . Arthralgia of multiple joints 06/26/2016  . Chronic insomnia 12/18/2015  . H/O total knee replacement 07/07/2015  . Venous insufficiency of lower extremity 12/25/2013  . Longstanding persistent atrial fibrillation ([place]) 12/25/2013  . Dyslipidemia - on statin; monitored by PCP 12/25/2013  . Preoperative cardiovascular examination 12/25/2013  . Obesity (BMI 30-39.9) 11/06/2007  . Essential hypertension 11/06/2007  . Bilateral lower extremity edema 11/06/2007  . DYSPNEA 11/06/2007  . Edema 11/06/2007    Past Surgical History:  Procedure Laterality Date  . Aorta doppler  11/12/2012   normal abdomnal aortic duplex   . JOINT REPLACEMENT Left Jul 07, 2015  . NM MYOVIEW LTD  07/30/2011   non-gated study seconadry to a fib;exercise  capcity 7 mets--mildly reduced;low risk scan  . SKIN BIOPSY N/A sept 2016   Basal cell from face.  . TONSILLECTOMY    . TOTAL KNEE ARTHROPLASTY Left 07/07/2015   Procedure: LEFT TOTAL KNEE ARTHROPLASTY;  Surgeon: Netta Cedars, MD;  Location: [facility];  Service: Orthopedics;  Laterality: Left;  . TOTAL KNEE ARTHROPLASTY Right 03/26/2019   Procedure: TOTAL KNEE ARTHROPLASTY;  Surgeon: Netta Cedars, MD;  Location: [facility];  Service: Orthopedics;  Laterality: Right;  . TRANSTHORACIC ECHOCARDIOGRAM  02/22/2011   EF 50-55%; with  mild inferior wall hypokinesis, mild left atrial enlargement of 4.2cm  . TRANSTHORACIC ECHOCARDIOGRAM  11/12/2012   afib; EF 50-55% (CRO WMA b/c Afib - difficult gaiting). AoV sclerosis without stenosis       Family History  Problem Relation Age of Onset  . Cancer - Ovarian Mother   . Cancer Maternal Grandfather     Social History   Tobacco Use  . Smoking status: Former Smoker    Types: Pipe    Quit date: 10/24/2011    Years since quitting: 8.2  . Smokeless tobacco: Never Used  Substance Use Topics  . Alcohol use: No  . Drug use: No    Home Medications Prior to Admission medications   Medication Sig Start Date End Date Taking? Authorizing Provider  aspirin EC 81 MG tablet Take 81 mg by mouth daily.     [provider]  atorvastatin (LIPITOR) 10 MG tablet Take 1 tablet by mouth  daily 04/04/16   Leonie Man, MD  carboxymethylcellulose (REFRESH PLUS) 0.5 % SOLN Place 1 drop into both eyes 2 (two) times daily as needed (dry eyes).    [provider]  diclofenac (VOLTAREN) 75 MG EC tablet Take 75 mg by mouth 2 (two) times daily.     [provider]  diltiazem (CARDIZEM CD) 240 MG 24 hr capsule Take 240 mg by mouth daily.  12/30/17   [provider]  ELIQUIS 5 MG TABS tablet TAKE 1 TABLET BY MOUTH TWICE A DAY 12/06/19   Lendon Colonel, NP  EPINEPHrine (EPIPEN 2-PAK) 0.3 mg/0.3 mL IJ SOAJ injection Inject 0.3 mg into the muscle as needed for anaphylaxis.     [provider]  hydrochlorothiazide (HYDRODIURIL) 25 MG tablet Take 1 tablet (25 mg total) by mouth daily. 12/24/19   Lendon Colonel, NP  irbesartan (AVAPRO) 300 MG tablet Take 1 tablet (300 mg total) by mouth daily. 12/24/19   Lendon Colonel, NP  methocarbamol (ROBAXIN) 500 MG tablet Take 1 tablet (500 mg total) by mouth every 8 (eight) hours as needed. 03/26/19   Netta Cedars, MD  Multiple Vitamins-Minerals (MULTIVITAMIN WITH MINERALS) tablet Take 1 tablet by mouth  daily.    [provider]  spironolactone (ALDACTONE) 25 MG tablet TAKE ONE-HALF TABLET BY  MOUTH DAILY 12/30/19   Lendon Colonel, NP  traMADol (ULTRAM) 50 MG tablet Take 1-2 tablets (50-100 mg total) by mouth every 6 (six) hours as needed for moderate pain or severe pain. 03/26/19 03/25/20  Netta Cedars, MD  traZODone (DESYREL) 100 MG tablet Take 1/2 to one tablet at bedtime for insomnia 07/07/19   [provider]    Allergies    Copper, Lipitor [atorvastatin], and Terbinafine  Review of Systems   Review of Systems  Constitutional: Positive for fatigue and malaise/fatigue. Negative for chills, diaphoresis and fever.  HENT: Negative for ear pain and sore throat.   Eyes: Negative for pain and visual disturbance.  Respiratory: Negative for cough and shortness of breath.   Cardiovascular: Positive for syncope. Negative for chest pain and palpitations.  Gastrointestinal: Negative for abdominal pain and vomiting.  Genitourinary: Negative for dysuria and hematuria.  Musculoskeletal: Negative for arthralgias and back pain.  Skin: Negative for color change and rash.  Neurological: Positive for syncope and weakness. Negative for dizziness, seizures, facial asymmetry, speech difficulty, light-headedness, numbness and headaches.  Psychiatric/Behavioral: Negative for confusion.  All other systems reviewed and are negative.   Physical Exam Updated Vital Signs  ED Triage Vitals  Enc Vitals Group     BP 01/26/20 0906 99/60     Pulse Rate 01/26/20 0906 60     Resp 01/26/20 0906 18     Temp 01/26/20 0906 (!) 97.4 F (36.3 C)     Temp Source 01/26/20 0906 Oral     SpO2 01/26/20 0906 99 %     Weight 01/26/20 0910 236 lb (107 kg)     Height 01/26/20 0910 5\' 11"  (1.803 m)     Head Circumference --      Peak Flow --      Pain Score 01/26/20 0908 0     Pain Loc --      Pain Edu? --      Excl. in [place]? --     Physical Exam Vitals and nursing note reviewed.  Constitutional:       General: He is not in acute distress.    Appearance: He is well-developed. He is not ill-appearing.  HENT:     Head: Normocephalic and atraumatic.     Nose: Nose normal.     Mouth/Throat:     Mouth: Mucous membranes are moist.  Eyes:     Extraocular Movements: Extraocular movements intact.     Conjunctiva/sclera: Conjunctivae normal.     Pupils: Pupils are equal, round, and reactive to light.  Cardiovascular:     Rate and Rhythm: Normal rate and regular rhythm.     Pulses: Normal pulses.     Heart sounds: Normal heart sounds. No murmur.  Pulmonary:     Effort: Pulmonary effort is normal. No respiratory distress.     Breath sounds: Normal breath sounds.  Abdominal:     Palpations: Abdomen is soft.     Tenderness: There is no abdominal tenderness.  Musculoskeletal:     Cervical back: Normal range of motion and neck supple.  Skin:    General: Skin is warm and dry.  Neurological:     General: No focal deficit present.     Mental Status: He is alert and oriented to person, place, and time.     Cranial Nerves: No cranial nerve deficit.     Sensory: No sensory deficit.     Motor: No weakness.     Coordination: Coordination normal.     Gait: Gait normal.     Comments: 5+/5 strength, normal sensation, no drift, normal speech, no visual field deficit     ED Results / Procedures / Treatments   Labs (all labs ordered are listed, but only abnormal results are displayed) Labs Reviewed  SARS CORONAVIRUS 2 BY RT PCR ([facility]) - Abnormal; Notable for the following components:      Result Value   SARS Coronavirus 2 POSITIVE (*)    All other components within normal limits  BASIC METABOLIC PANEL - Abnormal; Notable for the following components:   Sodium 131 (*)    Glucose, Bld 170 (*)  Creatinine, Ser 1.25 (*)    Calcium 8.2 (*)    GFR calc non Af Amer 56 (*)    All other components within normal limits  CBC WITH  DIFFERENTIAL/PLATELET - Abnormal; Notable for the following components:   RBC 3.60 (*)    Hemoglobin 12.4 (*)    HCT 37.5 (*)    MCV 104.2 (*)    MCH 34.4 (*)    RDW 11.4 (*)    Platelets 148 (*)    Lymphs Abs 0.5 (*)    All other components within normal limits  HEPATIC FUNCTION PANEL - Abnormal; Notable for the following components:   Total Protein 5.4 (*)    All other components within normal limits  CBG MONITORING, ED - Abnormal; Notable for the following components:   Glucose-Capillary 129 (*)    All other components within normal limits  URINALYSIS, ROUTINE W REFLEX MICROSCOPIC  TROPONIN I (HIGH SENSITIVITY)  TROPONIN I (HIGH SENSITIVITY)    EKG EKG Interpretation  Date/Time:  Wednesday Jan 26 2020 09:08:34 EDT Ventricular Rate:  57 PR Interval:    QRS Duration: 89 QT Interval:  405 QTC Calculation: 395 R Axis:   75 Text Interpretation: Atrial fibrillation Low voltage, precordial leads Confirmed by Lennice Sites [phone]) on 01/26/2020 10:24:03 AM   Radiology DG Chest Port 1 View  Result Date: 01/26/2020 CLINICAL DATA:  Syncope EXAM: PORTABLE CHEST 1 VIEW COMPARISON:  August 23, 2015 FINDINGS: There is elevation of the left hemidiaphragm with left base atelectasis. The lungs elsewhere are clear. Heart is mildly enlarged with pulmonary vascularity normal, stable. No adenopathy. No bone lesions. IMPRESSION: Stable elevation of the left hemidiaphragm with left base atelectasis. Lungs elsewhere clear. Stable cardiac silhouette. Electronically Signed   By: Lowella Grip III M.D.   On: 01/26/2020 10:27    Procedures Procedures (including critical care time)  Medications Ordered in ED Medications  sodium chloride 0.9 % bolus 1,000 mL (0 mLs Intravenous Stopped 01/26/20 1020)    ED Course  I have reviewed the triage vital signs and the nursing notes.  Pertinent labs & imaging results that were available during my care of the patient were reviewed by me and  considered in my medical decision making (see chart for details).    MDM Rules/Calculators/A&P                      Alex Alvarez is a 74 year old male with history of A. fib, hypertension who presents to the ED with syncope.  Patient with unremarkable vitals.  No fever.  Patient passed out while sitting on a chair while visiting his wife in ICU.  He states that he woke up not feeling very well but denied any chest pain or shortness of breath.  Thinks that he fully passed out because he cannot fully remember the event.  Event was witnessed.  He did not fall or hit his head.  He remained in the chair.  He states that he remembered being thirsty and wanted something to drink and had just drank prior to this event.  He does not remember trying to quickly stand up.  He states that yesterday he felt 2 episodes of lightheadedness.  One of them involved when he stood up and change positions.  States that maybe he has not ate or drank very well recently.  EKG shows rate controlled A. fib.  No obvious ischemic changes.  Neuro exam is normal.  No evidence of trauma.  No  need for any head imaging at this time.  He did have syncopal event with no specific prodromal issue.  He does have history of dysrhythmia.  Will get lab work including troponin.  Will give IV fluids.  Anticipate admission for syncope work-up given his risk factors and age.  Likely would benefit from an echocardiogram.  Possibly he is orthostatic.  Patient positive for Covid.  However chest x-ray without any acute findings.  No pneumonia, no pneumothorax.  No significant anemia, electrolyte abnormality, kidney injury.  Patient hemodynamically stable.  Has received fluid bolus.  Patient did get vaccinated against coronavirus back in April.  He and his wife both had Covid back in October.  He had a negative Covid test 11 to 12 days after his first positive test back in October.  Unsure if this is a new positive test but likely.  Overall he appears to  be stable but would benefit from telemetry and hydration.  This chart was dictated using voice recognition software.  Despite best efforts to proofread,  errors can occur which can change the documentation meaning.    Final Clinical Impression(s) / ED Diagnoses Final diagnoses:  Syncope and collapse  COVID-19    Rx / DC Orders ED Discharge Orders    None       Lennice Sites, DO 01/26/20 1133

## 2020-01-26 NOTE — ED Notes (Signed)
Attempted to stick patient x2 to left fa and left ac unsuccessful. Main phlebotomy called to draw labs.

## 2020-01-26 NOTE — ED Notes (Signed)
Date and time results received: 01/26/20 1123 (use smartphrase ".now" to insert current time)  Test: COVID Critical Value: Positive   Name of Provider Notified: [place] MD  MD aware

## 2020-01-26 NOTE — Progress Notes (Signed)
Discussed with infectious disease Dr. Graylon Good about positive Covid PCR in the absence of symptoms. She called the lab to learn about the specific targets and cycling thresholds in the PCR. Based on the lab reports, patient at this time does not have true infection and probably tested positive because of viral particles. We will however keep the patient on isolation.  No need of treatment at this time.

## 2020-01-26 NOTE — ED Notes (Signed)
Attempted to call report to the floor for pt transfer, nurse not available at this time. Will try back

## 2020-01-26 NOTE — ED Notes (Signed)
Hospitalist at bedside 

## 2020-01-26 NOTE — ED Notes (Signed)
phlebotomy at bedside.  

## 2020-01-26 NOTE — ED Notes (Signed)
Provider at bedside during triage process. Awaiting orders to be placed at present. EKG has been obtained and provider has in hand for review.

## 2020-01-26 NOTE — ED Notes (Signed)
Phlebotomy at bedside.

## 2020-01-27 DIAGNOSIS — I951 Orthostatic hypotension: Secondary | ICD-10-CM | POA: Diagnosis not present

## 2020-01-27 LAB — BASIC METABOLIC PANEL
Anion gap: 10 (ref 5–15)
BUN: 18 mg/dL (ref 8–23)
CO2: 26 mmol/L (ref 22–32)
Calcium: 9 mg/dL (ref 8.9–10.3)
Chloride: 105 mmol/L (ref 98–111)
Creatinine, Ser: 1.16 mg/dL (ref 0.61–1.24)
GFR calc Af Amer: >60 mL/min (ref >60)
GFR calc non Af Amer: >60 mL/min (ref >60)
Glucose, Bld: 102 mg/dL — ABNORMAL HIGH (ref 70–99)
Potassium: 4.5 mmol/L (ref 3.5–5.1)
Sodium: 141 mmol/L (ref 135–145)

## 2020-01-27 LAB — CBC
HCT: 44.1 % (ref 39.0–52.0)
Hemoglobin: 14 g/dL (ref 13.0–17.0)
MCH: 33.7 pg (ref 26.0–34.0)
MCHC: 31.7 g/dL (ref 30.0–36.0)
MCV: 106 fL — ABNORMAL HIGH (ref 80.0–100.0)
Platelets: 171 10*3/uL (ref 150–400)
RBC: 4.16 MIL/uL — ABNORMAL LOW (ref 4.22–5.81)
RDW: 11.5 % (ref 11.5–15.5)
WBC: 6.5 10*3/uL (ref 4.0–10.5)
nRBC: 0 % (ref 0.0–0.2)

## 2020-01-27 MED ORDER — DILTIAZEM HCL ER COATED BEADS 180 MG PO CP24: 180.0000 mg | ORAL_CAPSULE | Freq: Every day | ORAL | 0 refills | Status: DC

## 2020-01-27 NOTE — Discharge Summary (Signed)
Physician Discharge Summary  Alex Alvarez [id number] DOB: 03/16/1946 DOA: 01/26/2020  PCP: Christain Sacramento, MD  Admit date: 01/26/2020 Discharge date: 01/27/2020  Admitted From: Home Disposition: Home   Recommendations for Outpatient Follow-up:  1. Follow up with cardiology as soon as reasonable to monitor HR and BP. Decreased diltiazem dose to 180mg  daily from 240mg  daily due to symptomatic bradycardia. Had 3 second pause on telemetry while here while sleeping.  Home Health: None Equipment/Devices: None Discharge Condition: Stable CODE STATUS: Full Diet recommendation: Heart healthy  Brief/Interim Summary: Alex Alvarez is a 74 y.o. male with PMH significant for HTN, HLD, chronic A. fib, bilateral lower extremity venous stasis who lives at home with his wife. This morning, patient was in ICU as a visitor to his wife who is intubated.  While sitting on a chair, he had a syncopal episode and hence sent to the ED.  On 5/20, his wife was hospitalized for difficulty breathing and currently remains intubated in ICU. For last 3 to 4 days, patient thinks he has not been good at maintaining his appetite and hydration. Yesterday, he felt weak waking up in the morning.  He felt dizzy on changing his posture. This morning he felt dizzy again.  He was aware of dehydration and was trying to drink enough water.  He has been compliant with his medications including diuretics. While seated in a recliner in his wife's ICU room, patient felt dizzy and passed out witnessed by speech therapist. Rapid response was called.  His heart rate was noted to be 39 in the EKG with no discernible P waves.  His blood pressure was low at 77/53.  Oxygen saturation 90% room air. He regained consciousness in few minutes. He was given fluid bolus and transferred to ED.  In the ED, he was afebrile, heart rate persistently low at 40s.  Blood pressure as low as 85/71, later improved to a max of 117/75.  He was  breathing on room air. Chemistry showed sodium low at 131, glucose 170, BUN/creatinine 23/1.25 (baseline 1.09 in July 2020). CBC with normal WBC, hemoglobin 12.4, platelet 148 Surprisingly Covid PCR came back positive.  Chest x-ray normal.  Hospital service was consulted for further evaluation management. At the time of my evaluation, patient was properly drowsy.  Not in distress. Patient states patient and his wife both had Covid infection in October 2020.  He did not require hospitalization. Both of them got vaccine in April.  Since his diagnosis of Covid, patient has persistent sinusitis symptoms but no lower respiratory symptoms.  Diuretics and diltiazem were held with improvement in symptoms and vital signs. He feels completely well and is eager to be discharged on 5/27. He will follow up with his cardiologist.  Discharge Diagnoses:  Active Problems:   Orthostatic syncope  Orthostatic syncope: Orthostatic vital signs have normalized on day of discharge with no symptoms. This was precipitated by not taking care of himself very well due to focusing on his wife's critical illness (intubated over the weekend, remains in ICU).  - Restart home medications 5/28.   Chronic atrial fibrillation with bradycardic ventricular response: Improving while holding diltiazem. Had 3 second pause and 2 second pause on telemetry overnight, none during waking hours and is no longer symptomatic.  - Decrease diltiazem to 180mg  daily and follow up with cardiology ASAP.  - Continue eliquis.   AKI: Improved with IV fluids, near baseline on day of discharge at 1.16. Tolerating po.  - Ok to restart  home medications beginning tomorrow.   Covid PCR positive: With very high cycle threshold on N2 marker, this is such a faint positive that no active infection is suspected in setting of no specific symptoms. Unfortunately, however, since it was a positive test, 10 days of isolation is recommended per discussions  with Infectious Disease colleague, Dr. Baxter Flattery.   HLD - Continue statin  Insomnia:  - Continue home medications.   Obesity: Body mass index is 32.92 kg/m.   Discharge Instructions Discharge Instructions    Diet - low sodium heart healthy   Complete by: As directed    Discharge instructions   Complete by: As directed    You were admitted for syncope (passing out) and found to be dehydrated with low heart rate. This has improved with IV fluids and holding your medications. You are now stable to be discharged with plans to restart your medications. The only change is to take a different dose of diltiazem. START taking diltiazem 180mg  once daily. This REPLACES diltiazem 240mg  daily. It is important for you to follow up with your cardiologist as soon as possible for recheck and return if your symptoms return.   Unfortunately, you have tested very slightly positive for covid. This requires no treatment, but it is recommended that you remain in isolation for 10 total days from the time of positive test. This means you are not able to visit your wife while she is admitted in the hospital.   Increase activity slowly   Complete by: As directed      Allergies as of 01/27/2020      Reactions   Copper Rash   Lipitor [atorvastatin] Other (See Comments)   Leg cramps- pt takes 10mg  daily at home   Terbinafine Rash      Medication List    STOP taking these medications   methocarbamol 500 MG tablet Commonly known as: Robaxin   traMADol 50 MG tablet Commonly known as: Ultram     TAKE these medications   amoxicillin 500 MG tablet Commonly known as: AMOXIL Take 2,000 mg by mouth as directed. Take 4 capsules by mouth 1 hour prior to dental appt   aspirin EC 81 MG tablet Take 81 mg by mouth daily.   atorvastatin 10 MG tablet Commonly known as: LIPITOR Take 1 tablet by mouth  daily   carboxymethylcellulose 0.5 % Soln Commonly known as: REFRESH PLUS Place 1 drop into both eyes 2 (two)  times daily as needed (dry eyes).   diclofenac 75 MG EC tablet Commonly known as: VOLTAREN Take 75 mg by mouth daily.   diltiazem 180 MG 24 hr capsule Commonly known as: CARDIZEM CD Take 1 capsule (180 mg total) by mouth daily. What changed:   medication strength  how much to take   Eliquis 5 MG Tabs tablet Generic drug: apixaban TAKE 1 TABLET BY MOUTH TWICE A DAY   EpiPen 2-Pak 0.3 mg/0.3 mL Soaj injection Generic drug: EPINEPHrine Inject 0.3 mg into the muscle as needed for anaphylaxis.   hydrochlorothiazide 25 MG tablet Commonly known as: HYDRODIURIL Take 1 tablet (25 mg total) by mouth daily.   irbesartan 300 MG tablet Commonly known as: Avapro Take 1 tablet (300 mg total) by mouth daily.   multivitamin with minerals tablet Take 1 tablet by mouth daily.   spironolactone 25 MG tablet Commonly known as: ALDACTONE TAKE ONE-HALF TABLET BY  MOUTH DAILY   traZODone 100 MG tablet Commonly known as: DESYREL Take 50-100 mg by mouth at bedtime  as needed for sleep (if Zolpidem doesn't work).   zolpidem 10 MG tablet Commonly known as: AMBIEN Take 10 mg by mouth at bedtime. Start taking on: February 15, 2020      Follow-up Information    Christain Sacramento, MD. Schedule an appointment as soon as possible for a visit in 1 week(s).   Specialty: Family Medicine Contact information: [address] [place] [address] [phone]          Allergies  Allergen Reactions  . Copper Rash  . Lipitor [Atorvastatin] Other (See Comments)    Leg cramps- pt takes 10mg  daily at home  . Terbinafine Rash    Consultations:  None  Procedures/Studies: DG Chest Port 1 View  Result Date: 01/26/2020 CLINICAL DATA:  Syncope EXAM: PORTABLE CHEST 1 VIEW COMPARISON:  August 23, 2015 FINDINGS: There is elevation of the left hemidiaphragm with left base atelectasis. The lungs elsewhere are clear. Heart is mildly enlarged with pulmonary vascularity normal, stable. No adenopathy. No  bone lesions. IMPRESSION: Stable elevation of the left hemidiaphragm with left base atelectasis. Lungs elsewhere clear. Stable cardiac silhouette. Electronically Signed   By: Lowella Grip III M.D.   On: 01/26/2020 10:27   Subjective: Feels well, eating and drinking better. Says he got dehydrated. Up without assistance in the room without lightheadedness. No palpitations or dyspnea.  Discharge Exam: Vitals:   01/27/20 0900 01/27/20 1359  BP:  104/76  Pulse:  71  Resp: 18 16  Temp: 98.2 F (36.8 C) 98 F (36.7 C)  SpO2:  95%   General: Pt is alert, awake, not in acute distress Cardiovascular: RRR, S1/S2 +, no rubs, no gallops Respiratory: CTA bilaterally, no wheezing, no rhonchi Abdominal: Soft, NT, ND, bowel sounds + Extremities: Trace edema, no cyanosis  Labs: BNP (last 3 results) No results for input(s): BNP in the last 8760 hours. Basic Metabolic Panel: Recent Labs  Lab 01/26/20 1014 01/27/20 0401  NA 131* 141  K 4.7 4.5  CL 98 105  CO2 27 26  GLUCOSE 170* 102*  BUN 23 18  CREATININE 1.25* 1.16  CALCIUM 8.2* 9.0   Liver Function Tests: Recent Labs  Lab 01/26/20 1014  AST 22  ALT 28  ALKPHOS 55  BILITOT 0.7  PROT 5.4*  ALBUMIN 3.5   No results for input(s): LIPASE, AMYLASE in the last 168 hours. No results for input(s): AMMONIA in the last 168 hours. CBC: Recent Labs  Lab 01/26/20 1014 01/27/20 0401  WBC 5.4 6.5  NEUTROABS 4.4  --   HGB 12.4* 14.0  HCT 37.5* 44.1  MCV 104.2* 106.0*  PLT 148* 171   Cardiac Enzymes: No results for input(s): CKTOTAL, CKMB, CKMBINDEX, TROPONINI in the last 168 hours. BNP: Invalid input(s): POCBNP CBG: Recent Labs  Lab 01/26/20 0949  GLUCAP 129*   D-Dimer No results for input(s): DDIMER in the last 72 hours. Hgb A1c No results for input(s): HGBA1C in the last 72 hours. Lipid Profile No results for input(s): CHOL, HDL, LDLCALC, TRIG, CHOLHDL, LDLDIRECT in the last 72 hours. Thyroid function studies No  results for input(s): TSH, T4TOTAL, T3FREE, THYROIDAB in the last 72 hours.  Invalid input(s): FREET3 Anemia work up No results for input(s): VITAMINB12, FOLATE, FERRITIN, TIBC, IRON, RETICCTPCT in the last 72 hours. Urinalysis    Component Value Date/Time   COLORURINE YELLOW 01/26/2020 1428   APPEARANCEUR CLEAR 01/26/2020 1428   LABSPEC 1.009 01/26/2020 1428   PHURINE 5.0 01/26/2020 1428   GLUCOSEU NEGATIVE  01/26/2020 [place] 01/26/2020 [place] 01/26/2020 [place] 01/26/2020 [place] 01/26/2020 1428   NITRITE NEGATIVE 01/26/2020 [place] 01/26/2020 1428    Microbiology Recent Results (from the past 240 hour(s))  SARS Coronavirus 2 by RT PCR (hospital order, performed in [place] hospital lab) Nasopharyngeal Nasopharyngeal Swab     Status: Abnormal   Collection Time: 01/26/20  9:40 AM   Specimen: Nasopharyngeal Swab  Result Value Ref Range Status   SARS Coronavirus 2 POSITIVE (A) NEGATIVE Final    Comment: RESULT CALLED TO, READ BACK BY AND VERIFIED WITH: THOMPSON,C. RN @1118  ON 5.26.2021 BY NMCCOY (NOTE) SARS-CoV-2 target nucleic acids are DETECTED SARS-CoV-2 RNA is generally detectable in upper respiratory specimens  during the acute phase of infection.  Positive results are indicative  of the presence of the identified virus, but do not rule out bacterial infection or co-infection with other pathogens not detected by the test.  Clinical correlation with patient history and  other diagnostic information is necessary to determine patient infection status.  The expected result is negative. Fact Sheet for Patients:   StrictlyIdeas.no  Fact Sheet for Healthcare Providers:   BankingDealers.co.za   This test is not yet approved or cleared by the [place] FDA and  has been authorized for detection and/or diagnosis of SARS-CoV-2 by FDA  under an Emergency Use Authorization (EUA).  This EUA will remain in effect (meaning this t est can be used) for the duration of  the COVID-19 declaration under Section 564(b)(1) of the Act, 21 U.S.C. section 360-bbb-3(b)(1), unless the authorization is terminated or revoked sooner. Performed at [facility], [place] [address], [place], [place] [address]     Time coordinating discharge: Approximately 40 minutes  Patrecia Pour, MD  [facility] 01/27/2020, 3:06 PM

## 2020-01-28 ENCOUNTER — Telehealth: Payer: Self-pay | Admitting: Cardiology

## 2020-01-28 NOTE — Telephone Encounter (Signed)
    Pt said he was in the ED yesterday and the doctor said he might need a pacemaker. He felt he doesn't need it and he only got dehydrated that triggered his symptoms. He said he will monitor his BP and HR and will call back after a week to give to Dr. Ellyn Hack

## 2020-02-21 ENCOUNTER — Other Ambulatory Visit: Payer: Self-pay | Admitting: Cardiology

## 2020-02-21 NOTE — Telephone Encounter (Signed)
*  STAT* If patient is at the pharmacy, call can be transferred to refill team.   1. Which medications need to be refilled? (please list name of each medication and dose if known)  diltiazem (CARDIZEM CD) 180 MG 24 hr capsule  2. Which pharmacy/location (including street and city if local pharmacy) is medication to be sent to? [id number] - [place], [place] - [address]  3. Do they need a 30 day or 90 day supply? 30  Pt needs enough medication to last him until his appt with Dr. Ellyn Hack 03/16/20

## 2020-02-22 MED ORDER — DILTIAZEM HCL ER COATED BEADS 180 MG PO CP24: 180.0000 mg | ORAL_CAPSULE | Freq: Every day | ORAL | 1 refills | Status: DC

## 2020-03-16 ENCOUNTER — Ambulatory Visit: Payer: Medicare Other | Admitting: Cardiology

## 2020-03-16 ENCOUNTER — Other Ambulatory Visit: Payer: Self-pay

## 2020-03-16 ENCOUNTER — Encounter: Payer: Self-pay | Admitting: Cardiology

## 2020-03-16 VITALS — BP 124/86 | HR 56 | Temp 97.0°F | Ht 71.0 in | Wt 240.0 lb

## 2020-03-16 DIAGNOSIS — R600 Localized edema: Secondary | ICD-10-CM

## 2020-03-16 DIAGNOSIS — I951 Orthostatic hypotension: Secondary | ICD-10-CM

## 2020-03-16 DIAGNOSIS — I4821 Permanent atrial fibrillation: Secondary | ICD-10-CM | POA: Diagnosis not present

## 2020-03-16 DIAGNOSIS — E785 Hyperlipidemia, unspecified: Secondary | ICD-10-CM

## 2020-03-16 DIAGNOSIS — I10 Essential (primary) hypertension: Secondary | ICD-10-CM | POA: Diagnosis not present

## 2020-03-16 NOTE — Progress Notes (Signed)
Primary Care Provider: Christain Sacramento, MD Cardiologist: Glenetta Hew, MD Electrophysiologist: None  Clinic Note: Chief Complaint  Patient presents with  . Hospitalization Follow-up    Hospitalized for syncope in May  . Atrial Fibrillation   HPI:    Alex Alvarez is a 74 y.o. male with a PMH notable for longstanding essentially Permanent A. fib along with hypertension hyperlipidemia, chronic lower extremity edema who presents today for delayed 33-month and hospital follow-up.  Alex Alvarez was last seen on August 24, 2019 by Jory Sims, NP, and by me in June 2020 for preop evaluation for total knee replacement via telemedicine.  At the visit with Alex Alvarez denied any concerning symptoms of palpitations, dyspnea exertion, chest pain or edema.  Did note his blood pressure had been running a little high. -> He was diagnosed with COVID-19 in October 2020 along with his wife.  He was not hospitalized, but his wife was hospitalized in [place] for 2 weeks (apparently close intubation).  He said the major side effect of Covid was having some troubles with concentration-range.  Was cleared by medicine.  Recent Hospitalizations:   Admitted May 26-27, 2021: ORTHOSTATIC HYPOTENSION/SYNCOPE.  He was visiting his wife who was in the ICU, intubated.  He had not believe been eating and drinking very well, and was quite dehydrated.  Preoccupied with visiting his wife.  He felt very weak waking up in the morning.  He felt dizzy with position change.  He was visiting his wife and essentially passed out.  Was witnessed by the speech therapist. -->  Rapid response called and he was noted to have a heart rate of 39 and what appeared to be A. fib.  Was hypotensive at 70 77/50 3 mmHg. ->  He regained consciousness after a few minutes and was given boluses and transferred to the ER.  Blood pressure improved in the ER. -->  In the COVID-19 positive he and his wife both had COVID-19 back in  October 2020) he then got his vaccine in April.  Diuretics and diltiazem held and vital signs improved..  Was noted to have 2 and 3-second pauses on telemetry.  All during sleeping hours. -->  Was discharged on lower dose of diltiazem.  Reviewed  CV studies:    The following studies were reviewed today: (if available, images/films reviewed: From Epic Chart or Care Everywhere) . None:   Interval History:   Alex Alvarez returns here today for follow-up after his hospitalization.  He says he is gradually been getting stronger and and feeling better.  He did indicate that he really was not doing a good job eating and drinking when his wife was sick.  He is now started back to normal.  He has blood pressure recordings at home ranging as low as 97/67 up to 126/82.  He has had some dizziness but no more syncope or near syncope. He says he occasionally has swelling in his calf but that usually with prolonged standing.  He really has no sensation whatsoever but with being in A. fib.  He has not had palpitation sensations.  No dyspnea or weakness, numbness.  CV Review of Symptoms (Summary) Cardiovascular ROS: no chest pain or dyspnea on exertion positive for - edema, loss of consciousness and Weakness and fatigue leading to his hospitalization negative for - irregular heartbeat, orthopnea, palpitations, paroxysmal nocturnal dyspnea, rapid heart rate, shortness of breath or TIA/amaurosis fugax, no further syncope or near syncope  The patient does  not have symptoms concerning for COVID-19 infection (fever, chills, cough, or new shortness of breath).  The patient is practicing social distancing & Masking.   He Has Good Vaccines Levan Hurst) March and April  REVIEWED OF SYSTEMS   Review of Systems  Constitutional: Negative for malaise/fatigue and weight loss.  HENT: Negative for congestion.   Respiratory: Negative for shortness of breath.   Cardiovascular: Positive for leg swelling.   Gastrointestinal: Negative for blood in stool and melena.  Genitourinary: Negative for hematuria.  Musculoskeletal: Positive for joint pain (Normal arthritis pains).  Neurological: Positive for weakness (Getting stronger each day since his discharge).  Psychiatric/Behavioral: Negative.    I have reviewed and (if needed) personally updated the patient's problem list, medications, allergies, past medical and surgical history, social and family history.   PAST MEDICAL HISTORY   Past Medical History:  Diagnosis Date  . Bilateral lower extremity edema   . 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 Chronic atrial fibrillation ([place]) 8/132014 office note   chronic afib dates back to June 2012; CHADS2-VASc score 2; candidate for dual  antiplatelet therapy based on active C and active S STUDIES  for thromboembolic prophyaxis--actually ot on coumadin but on aspirin ad plavix  . Chronic insomnia   . Chronically elevated hemidiaphragm    Left  . Dyslipidemia   . Dysrhythmia   . Former light tobacco smoker     smoked a pipe; quit 25+ years ago   . Hypertension   . Obesity (BMI 30.0-34.9)   . 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 Venous stasis of both lower extremities    with edema    PAST SURGICAL HISTORY   Past Surgical History:  Procedure Laterality Date  . Aorta doppler  11/12/2012   normal abdomnal aortic duplex   . JOINT REPLACEMENT Left Jul 07, 2015  . NM MYOVIEW LTD  07/30/2011   non-gated study seconadry to a fib;exercise capcity 7 mets--mildly reduced;low risk scan  . SKIN BIOPSY N/A sept 2016   Basal cell from face.  . TONSILLECTOMY    . 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 TOTAL KNEE ARTHROPLASTY Right 03/26/2019   Procedure: TOTAL KNEE ARTHROPLASTY;  Surgeon: Netta Cedars, MD;  Location: [facility];  Service: Orthopedics;  Laterality: Right;  . TRANSTHORACIC ECHOCARDIOGRAM  02/22/2011   EF 50-55%; with mild inferior wall hypokinesis, mild left atrial enlargement of 4.2cm  . TRANSTHORACIC ECHOCARDIOGRAM  11/12/2012   afib; EF 50-55% (CRO WMA b/c Afib - difficult gaiting). AoV sclerosis without stenosis    MEDICATIONS/ALLERGIES   Current Meds  Medication Sig  . amoxicillin (AMOXIL) 500 MG tablet Take 2,000 mg by mouth as directed. Take 4 capsules by mouth 1 hour prior to dental appt  . aspirin EC 81 MG tablet Take 81 mg by mouth daily.   [name] atorvastatin (LIPITOR) 10 MG tablet Take 1 tablet by mouth  daily  . carboxymethylcellulose (REFRESH PLUS) 0.5 % SOLN Place 1 drop into both eyes 2 (two) times daily as needed (dry eyes).  [name] diclofenac (VOLTAREN) 75 MG EC tablet Take 75 mg by mouth daily.   [name] diltiazem (CARDIZEM CD) 180 MG 24 hr capsule Take 1 capsule (180 mg total) by mouth daily.  [name] ELIQUIS 5 MG TABS tablet TAKE  1 TABLET BY MOUTH TWICE A DAY  . EPINEPHrine (EPIPEN 2-PAK) 0.3 mg/0.3 mL IJ SOAJ injection Inject 0.3 mg into the muscle as needed for anaphylaxis.   . hydrochlorothiazide (HYDRODIURIL) 25 MG tablet Take 1 tablet (25 mg total) by mouth daily.  . irbesartan (AVAPRO) 300 MG tablet Take 1 tablet (300 mg total) by mouth daily.  . Multiple Vitamins-Minerals (MULTIVITAMIN WITH MINERALS) tablet Take 1 tablet by mouth daily.  . traZODone (DESYREL) 100 MG tablet Take 50-100 mg by mouth at bedtime as needed for sleep (if Zolpidem doesn't work).   . zolpidem (AMBIEN) 10 MG tablet Take 10 mg by mouth at bedtime.  . [DISCONTINUED] spironolactone (ALDACTONE) 25 MG tablet TAKE ONE-HALF TABLET BY  MOUTH DAILY (Patient taking differently: Take 12.5 mg by mouth daily. )    Allergies  Allergen Reactions  . Copper Rash  . Lipitor [Atorvastatin] Other (See Comments)    Leg cramps- pt takes 10mg  daily at home  . Terbinafine Rash    SOCIAL HISTORY/FAMILY  HISTORY   Reviewed in Epic:  Pertinent findings: His wife has been hospitalized now twice, in October for Covid and then decreased in May for pneumonia.  He himself had COVID-19 last October, but did not get hospitalized.  OBJCTIVE -PE, EKG, labs   Wt Readings from Last 3 Encounters:  03/16/20 240 lb (108.9 kg)  01/26/20 236 lb (107 kg)  08/24/19 241 lb (109.3 kg)    Physical Exam: BP 124/86   Pulse (!) 56   Temp (!) 97 F (36.1 C)   Ht 5\' 11"  (1.803 m)   Wt 240 lb (108.9 kg)   SpO2 96%   BMI 33.47 kg/m  Physical Exam Vitals reviewed.  Neck:     Vascular: No carotid bruit.  Cardiovascular:     Rate and Rhythm: Bradycardia present. Rhythm irregular.     Chest Wall: PMI is not displaced.     Pulses: Intact distal pulses.     Heart sounds: S1 normal and S2 normal. Murmur heard.  Medium-pitched harsh crescendo-decrescendo early systolic murmur is present with a grade of 1/6 at the upper right sternal border.  No friction rub. No gallop.   Pulmonary:     Effort: Pulmonary effort is normal. No respiratory distress.     Breath sounds: Normal breath sounds.  Chest:     Chest wall: No tenderness.  Abdominal:     General: Abdomen is flat. Bowel sounds are normal.     Palpations: Abdomen is soft.     Comments: No HSM  Musculoskeletal:        General: Swelling (Trivial bilateral) present. Normal range of motion.     Cervical back: Normal range of motion. No rigidity or tenderness.  Neurological:     General: No focal deficit present.     Mental Status: He is oriented to person, place, and time.  Psychiatric:        Mood and Affect: Mood normal.        Behavior: Behavior normal.        Thought Content: Thought content normal.        Judgment: Judgment normal.     Adult ECG Report  Rate: 56 ;  Rhythm: atrial fibrillation and Otherwise normal axis, intervals and durations.  Narrative Interpretation: Stable EKG.  Recent Labs: PCP checks lipids No results found for: CHOL,  HDL, LDLCALC, LDLDIRECT, TRIG, CHOLHDL Lab Results  Component Value Date   CREATININE 1.16 01/27/2020   BUN 18 01/27/2020  NA 141 01/27/2020   K 4.5 01/27/2020   CL 105 01/27/2020   CO2 26 01/27/2020   No results found for: TSH  ASSESSMENT/PLAN    Problem List Items Addressed This Visit    Permanent atrial fibrillation ([place]) (Chronic)    Lately asymptomatic from his A. fib.  I do think that he may have been a little bit dehydrated which could have led to the bradycardia with diltiazem.  His dose of diltiazem was reduced and his rates are still doing well.  For now we will simply continue with current dose and switch to p.m. dosing.  Not take if heart rate less than 50, or if not feeling well.  [name]  He remains on Eliquis plus aspirin which can be stopped      Essential hypertension - Primary (Chronic)    Blood pressure has been an issue with him and now he has had hypotension.  In light of the fact that he just had a recent episode, medications have been backed off.  I would simply have him discontinue spironolactone and use HCTZ as needed.  He will remain on diltiazem and irbesartan.      Relevant Orders   EKG 12-Lead (Completed)   Bilateral lower extremity edema (Chronic)    Likely related to venous stasis.  We will switch HCTZ to as needed.  He will not take it if he is not feeling well.      Dyslipidemia - on statin; monitored by PCP (Chronic)    Unfortunately, I do not have his most recent levels.  He is on atorvastatin and labs are followed by PCP.      Orthostatic syncope    This was a triggered event where he was not eating and drinking well.  He has not had an episode like this before.  We have now set guidelines for when he should not take medications.  He is only to be taking his diuretic HCTZ as needed and we will stop spironolactone.  Allow for some mild permissive hypertension.          COVID-19 Education: The signs and symptoms of COVID-19 were discussed  with the patient and how to seek care for testing (follow up with PCP or arrange E-visit).   The importance of social distancing and COVID-19 vaccination was discussed today.  I spent a total of 90minutes with the patient. >  50% of the time was spent in direct patient consultation.  Additional time spent with chart review  / charting (studies, outside notes, etc): 10 Total Time: 32 min   Current medicines are reviewed at length with the patient today.  (+/- concerns) n/a  Notice: This dictation was prepared with Dragon dictation along with smaller phrase technology. Any transcriptional errors that result from this process are unintentional and may not be corrected upon review.  Patient Instructions / Medication Changes & Studies & Tests Ordered   Patient Instructions  Medication Instructions:  Stop taking Spironolactone   change and take Diltiazem in the evening with your Eliquis  If heartrate is equal or less than 50   The do not take Diltiazem that day  if you are  Be very active and not able to keep hydrated  Or feeling bad - do not take HCTZ (hydrochlorothiazide )  *If you need a refill on your cardiac medications before your next appointment, please call your pharmacy*   Lab Work: Not needed If you have labs (blood work) drawn today and your tests  are completely normal, you will receive your results only by: [name] MyChart Message (if you have MyChart) OR . A paper copy in the mail If you have any lab test that is abnormal or we need to change your treatment, we will call you to review the results.   Testing/Procedures: Not needed   Follow-Up: At [facility], you and your health needs are our priority.  As part of our continuing mission to provide you with exceptional heart care, we have created designated Provider Care Teams.  These Care Teams include your primary Cardiologist (physician) and Advanced Practice Providers (APPs -  Physician Assistants and Nurse Practitioners)  who all work together to provide you with the care you need, when you need it.     Your next appointment:   1 to 2 month(s)  The format for your next appointment:   Virtual Visit   Provider:   Glenetta Hew, MD   Other Instructions     Studies Ordered:   Orders Placed This Encounter  Procedures  . EKG 12-Lead     Glenetta Hew, M.D., M.S. Interventional Cardiologist   Pager # [phone] Phone # [phone] [address] [place], [place] [address]   Thank you for choosing Heartcare at [facility]!!

## 2020-03-16 NOTE — Patient Instructions (Signed)
Medication Instructions:  Stop taking Spironolactone   change and take Diltiazem in the evening with your Eliquis  If heartrate is equal or less than 50   The do not take Diltiazem that day  if you are  Be very active and not able to keep hydrated  Or feeling bad - do not take HCTZ (hydrochlorothiazide )  *If you need a refill on your cardiac medications before your next appointment, please call your pharmacy*   Lab Work: Not needed If you have labs (blood work) drawn today and your tests are completely normal, you will receive your results only by: [name] MyChart Message (if you have MyChart) OR . A paper copy in the mail If you have any lab test that is abnormal or we need to change your treatment, we will call you to review the results.   Testing/Procedures: Not needed   Follow-Up: At [facility], you and your health needs are our priority.  As part of our continuing mission to provide you with exceptional heart care, we have created designated Provider Care Teams.  These Care Teams include your primary Cardiologist (physician) and Advanced Practice Providers (APPs -  Physician Assistants and Nurse Practitioners) who all work together to provide you with the care you need, when you need it.     Your next appointment:   1 to 2 month(s)  The format for your next appointment:   Virtual Visit   Provider:   Glenetta Hew, MD   Other Instructions

## 2020-03-24 ENCOUNTER — Encounter: Payer: Self-pay | Admitting: Cardiology

## 2020-03-24 NOTE — Assessment & Plan Note (Signed)
Lately asymptomatic from his A. fib.  I do think that he may have been a little bit dehydrated which could have led to the bradycardia with diltiazem.  His dose of diltiazem was reduced and his rates are still doing well.  For now we will simply continue with current dose and switch to p.m. dosing.  Not take if heart rate less than 50, or if not feeling well.  [name]  He remains on Eliquis plus aspirin which can be stopped

## 2020-03-24 NOTE — Assessment & Plan Note (Signed)
Likely related to venous stasis.  We will switch HCTZ to as needed.  He will not take it if he is not feeling well.

## 2020-03-24 NOTE — Assessment & Plan Note (Signed)
Blood pressure has been an issue with him and now he has had hypotension.  In light of the fact that he just had a recent episode, medications have been backed off.  I would simply have him discontinue spironolactone and use HCTZ as needed.  He will remain on diltiazem and irbesartan.

## 2020-03-24 NOTE — Assessment & Plan Note (Signed)
Unfortunately, I do not have his most recent levels.  He is on atorvastatin and labs are followed by PCP.

## 2020-03-24 NOTE — Assessment & Plan Note (Signed)
This was a triggered event where he was not eating and drinking well.  He has not had an episode like this before.  We have now set guidelines for when he should not take medications.  He is only to be taking his diuretic HCTZ as needed and we will stop spironolactone.  Allow for some mild permissive hypertension.

## 2020-04-14 ENCOUNTER — Other Ambulatory Visit: Payer: Self-pay | Admitting: Cardiology

## 2020-05-12 ENCOUNTER — Other Ambulatory Visit: Payer: Self-pay | Admitting: Cardiology

## 2020-05-19 ENCOUNTER — Telehealth: Payer: Self-pay | Admitting: *Deleted

## 2020-05-19 ENCOUNTER — Encounter: Payer: Self-pay | Admitting: Cardiology

## 2020-05-19 ENCOUNTER — Telehealth (INDEPENDENT_AMBULATORY_CARE_PROVIDER_SITE_OTHER): Payer: Medicare Other | Admitting: Cardiology

## 2020-05-19 VITALS — BP 110/68 | Ht 70.5 in | Wt 240.0 lb

## 2020-05-19 DIAGNOSIS — I10 Essential (primary) hypertension: Secondary | ICD-10-CM

## 2020-05-19 DIAGNOSIS — I951 Orthostatic hypotension: Secondary | ICD-10-CM | POA: Diagnosis not present

## 2020-05-19 DIAGNOSIS — I4821 Permanent atrial fibrillation: Secondary | ICD-10-CM

## 2020-05-19 DIAGNOSIS — I872 Venous insufficiency (chronic) (peripheral): Secondary | ICD-10-CM | POA: Diagnosis not present

## 2020-05-19 NOTE — Patient Instructions (Addendum)
Medication Instructions:   No change to last instructions -- seems to be working.  *If you need a refill on your cardiac medications before your next appointment, please call your pharmacy*   Lab Work: n/a     Testing/Procedures: n/a   Follow-Up: At [facility], you and your health needs are our priority.  As part of our continuing mission to provide you with exceptional heart care, we have created designated Provider Care Teams.  These Care Teams include your primary Cardiologist (physician) and Advanced Practice Providers (APPs -  Physician Assistants and Nurse Practitioners) who all work together to provide you with the care you need, when you need it.    Your next appointment:   8 month(s) May 2022  The format for your next appointment:   In Person  Provider:   Glenetta Hew, MD   Other Instructions Keep staying active. When sitting - elevate your feet Keep wearing support socks.

## 2020-05-19 NOTE — Telephone Encounter (Signed)
  Patient Consent for Virtual Visit         Alex Alvarez has provided verbal consent on 05/19/2020 for a virtual visit (video or telephone).   CONSENT FOR VIRTUAL VISIT FOR:  Alex Alvarez  By participating in this virtual visit I agree to the following:  I hereby voluntarily request, consent and authorize [facility] and its employed or contracted physicians, physician assistants, nurse practitioners or other licensed health care professionals (the Practitioner), to provide me with telemedicine health care services (the "Services") as deemed necessary by the treating Practitioner. I acknowledge and consent to receive the Services by the Practitioner via telemedicine. I understand that the telemedicine visit will involve communicating with the Practitioner through live audiovisual communication technology and the disclosure of certain medical information by electronic transmission. I acknowledge that I have been given the opportunity to request an in-person assessment or other available alternative prior to the telemedicine visit and am voluntarily participating in the telemedicine visit.  I understand that I have the right to withhold or withdraw my consent to the use of telemedicine in the course of my care at any time, without affecting my right to future care or treatment, and that the Practitioner or I may terminate the telemedicine visit at any time. I understand that I have the right to inspect all information obtained and/or recorded in the course of the telemedicine visit and may receive copies of available information for a reasonable fee.  I understand that some of the potential risks of receiving the Services via telemedicine include:  [name] Delay or interruption in medical evaluation due to technological equipment failure or disruption; . Information transmitted may not be sufficient (e.g. poor resolution of images) to allow for appropriate medical decision making by the Practitioner;  and/or  . In rare instances, security protocols could fail, causing a breach of personal health information.  Furthermore, I acknowledge that it is my responsibility to provide information about my medical history, conditions and care that is complete and accurate to the best of my ability. I acknowledge that Practitioner's advice, recommendations, and/or decision may be based on factors not within their control, such as incomplete or inaccurate data provided by me or distortions of diagnostic images or specimens that may result from electronic transmissions. I understand that the practice of medicine is not an exact science and that Practitioner makes no warranties or guarantees regarding treatment outcomes. I acknowledge that a copy of this consent can be made available to me via my patient portal ([place]), or I can request a printed copy by calling the office of [place].    I understand that my insurance will be billed for this visit.   I have read or had this consent read to me. . I understand the contents of this consent, which adequately explains the benefits and risks of the Services being provided via telemedicine.  . I have been provided ample opportunity to ask questions regarding this consent and the Services and have had my questions answered to my satisfaction. . I give my informed consent for the services to be provided through the use of telemedicine in my medical care

## 2020-05-19 NOTE — Telephone Encounter (Signed)
RN spoke to patient. Instruction were given  from today's virtual visit 05/19/20 .  AVS SUMMARY has been sent by mychart .   recall for follow up  Placed May 2022   Patient verbalized understanding

## 2020-05-19 NOTE — Progress Notes (Signed)
Virtual Visit via Telephone Note   This visit type was conducted due to national recommendations for restrictions regarding the COVID-19 Pandemic (e.g. social distancing) in an effort to limit this patient's exposure and mitigate transmission in our community.  Due to his co-morbid illnesses, this patient is at least at moderate risk for complications without adequate follow up.  This format is felt to be most appropriate for this patient at this time.  The patient did not have access to video technology/had technical difficulties with video requiring transitioning to audio format only (telephone).  All issues noted in this document were discussed and addressed.  No physical exam could be performed with this format.  Please refer to the patient's chart for his  consent to telehealth for [facility].   Patient has given verbal permission to conduct this visit via virtual appointment and to bill insurance 05/21/2020 11:29 PM     Evaluation Performed:  Follow-up visit  Date:  05/21/2020   ID:  Alex Alvarez, DOB 07-09-1946, MRN [id number]  Patient Location: Home Provider Location: Office/Clinic  PCP:  Christain Sacramento, MD  Cardiologist:  Glenetta Hew, MD  Electrophysiologist:  None   Chief Complaint:   Chief Complaint  Patient presents with  . Follow-up    no complaints  . Atrial Fibrillation    no Sx  . Loss of Consciousness    no further episodes    History of Present Illness:    Alex Alvarez is a 74 y.o. male with PMH notable for longstanding PAF along with HTN, HLD and chronic lower extremity edema, who presents via audio/video conferencing for a telehealth visit today as a 6-month follow-up.  Alex Alvarez was last seen 03/16/2020 for Hospital F/u (May 2021 - for [facility]) --> his syncopal episode was thought to be related to orthostatic hypotension (was visiting his wife in the ICU, had not been eating and drinking well and not getting enough sleep.  He is preoccupied with  his wife's health -> the week was a positional dizziness even before driving to the hospital). --> Was feeling much stronger and doing better with his eating and drinking.  His wife is getting healthier.  Still no sensation of A. fib.  No further syncope.  Hospitalizations:  . n/a   Recent - Interim CV studies:    The following studies were reviewed today: . none:  Inerval History   Alex Alvarez seems to be doing quite well.  Much better since stopping Spironolactone & more notably changing Diltiazem to PM.  BPs have been stable - no further syncope. Sometimes he can feel his heartbeat is somewhat irregular (when changing from sitting to walking or walking to sitting) - but only the sensation - no rapid rates.  He is not really symptomatic with the at all just has a sensation of an irregular heartbeat.  No lightheadedness or dizziness.  No further syncope.  Is ensuring that he stays adequately hydrated.  He actually states that he feels better the more he does.  The more active he is the better he feels.  This goes along with his arthritis symptoms as well as the swelling.  He notes that the swelling in his legs is worse when he sitting for long period time and much better when he is up moving around.  He is wearing support stockings, but had not considered the benefit of elevating his feet which he will now adopt.  He has not really had any  significant cardiac symptoms and is feeling quite well having just turned 74.  Cardiovascular ROS: no chest pain or dyspnea on exertion positive for - edema and irregular heartbeat negative for - orthopnea, paroxysmal nocturnal dyspnea, rapid heart rate, shortness of breath or Lightheadedness, dizziness or wooziness.  Syncope/near syncope, TIA/amaurosis fugax, claudication.  Melena, hematochezia, hematuria, epistaxis   ROS:  Please see the history of present illness.    The patient does not have symptoms concerning for COVID-19 infection (fever,  chills, cough, or new shortness of breath).  Review of Systems  Constitutional: Negative for malaise/fatigue and weight loss.  HENT: Negative for nosebleeds.   Respiratory: Negative for cough, shortness of breath and wheezing.   Cardiovascular: Positive for leg swelling (chronic - dependent; better with support socks & walking).  Gastrointestinal: Negative for blood in stool and melena.  Genitourinary: Negative for hematuria.  Musculoskeletal: Positive for joint pain (mild OA Sx - better if he stays active). Negative for falls and myalgias.  Neurological: Negative for dizziness and focal weakness.  Endo/Heme/Allergies: Does not bruise/bleed easily.  Psychiatric/Behavioral: The patient has insomnia (does well with Ambien nightly & Trazodone ~3 x per week). The patient is not nervous/anxious.     The patient is practicing social distancing.  Past Medical History:  Diagnosis Date  . Bilateral lower extremity edema   . 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 Chronic atrial fibrillation ([place]) 8/132014 office note   chronic afib dates back to June 2012; CHADS2-VASc score 2; candidate for dual  antiplatelet therapy based on active C and active S STUDIES  for thromboembolic prophyaxis--actually ot on coumadin but on aspirin ad plavix  . Chronic insomnia   . Chronically elevated hemidiaphragm    Left  . Dyslipidemia   . Dysrhythmia   . Former light tobacco smoker     smoked a pipe; quit 25+ years ago   . Hypertension   . Obesity (BMI 30.0-34.9)   . 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 Venous stasis of both lower extremities    with edema   Immunization History  Administered Date(s) Administered  . Fluad Quad(high Dose 65+) 06/04/2019  . Influenza Whole 09/25/2005  . Influenza, High Dose Seasonal PF 07/11/2014, 05/25/2017, 06/19/2018  . Moderna SARS-COVID-2 Vaccination 11/04/2019, 12/10/2019  . Pneumococcal  Conjugate-13 07/02/2017  . Pneumococcal Polysaccharide-23 07/07/2019  . Tdap 03/02/2005    Past Surgical History:  Procedure Laterality Date  . Aorta doppler  11/12/2012   normal abdomnal aortic duplex   . JOINT REPLACEMENT Left Jul 07, 2015  . NM MYOVIEW LTD  07/30/2011   non-gated study seconadry to a fib;exercise capcity 7 mets--mildly reduced;low risk scan  . SKIN BIOPSY N/A sept 2016   Basal cell from face.  . TONSILLECTOMY    . TOTAL KNEE ARTHROPLASTY Left 07/07/2015   Procedure: LEFT TOTAL KNEE ARTHROPLASTY;  Surgeon: Netta Cedars, MD;  Location: [place];  Service: Orthopedics;  Laterality: Left;  . TOTAL KNEE ARTHROPLASTY Right 03/26/2019   Procedure: TOTAL KNEE ARTHROPLASTY;  Surgeon: Netta Cedars, MD;  Location: [facility];  Service: Orthopedics;  Laterality: Right;  . TRANSTHORACIC ECHOCARDIOGRAM  02/22/2011   EF 50-55%; with mild inferior wall hypokinesis, mild left atrial enlargement of 4.2cm  . TRANSTHORACIC ECHOCARDIOGRAM  11/12/2012   afib; EF 50-55% (CRO WMA b/c Afib - difficult gaiting). AoV sclerosis without stenosis     Current Meds  Medication Sig  .  amoxicillin (AMOXIL) 500 MG tablet Take 2,000 mg by mouth as directed. Take 4 capsules by mouth 1 hour prior to dental appt  . aspirin EC 81 MG tablet Take 81 mg by mouth daily.   [name] atorvastatin (LIPITOR) 10 MG tablet Take 1 tablet by mouth  daily  . carboxymethylcellulose (REFRESH PLUS) 0.5 % SOLN Place 1 drop into both eyes 2 (two) times daily as needed (dry eyes).  [name] diclofenac (VOLTAREN) 75 MG EC tablet Take 75 mg by mouth daily.   [name] diltiazem (CARDIZEM CD) 180 MG 24 hr capsule TAKE 1 CAPSULE BY MOUTH EVERY DAY  . ELIQUIS 5 MG TABS tablet TAKE 1 TABLET BY MOUTH TWICE A DAY  . EPINEPHrine (EPIPEN 2-PAK) 0.3 mg/0.3 mL IJ SOAJ injection Inject 0.3 mg into the muscle as needed for anaphylaxis.   . hydrochlorothiazide (HYDRODIURIL) 25 MG tablet Take 1 tablet (25 mg total) by mouth daily.  . irbesartan (AVAPRO) 300 MG tablet  Take 1 tablet (300 mg total) by mouth daily.  . Multiple Vitamins-Minerals (MULTIVITAMIN WITH MINERALS) tablet Take 1 tablet by mouth daily.  . traZODone (DESYREL) 100 MG tablet Take 50-100 mg by mouth at bedtime as needed for sleep (if Zolpidem doesn't work).   . zolpidem (AMBIEN) 10 MG tablet Take 10 mg by mouth at bedtime.     Allergies:   Copper, Lipitor [atorvastatin], and Terbinafine   Social History   Tobacco Use  . Smoking status: Former Smoker    Types: Pipe    Quit date: 10/24/2011    Years since quitting: 8.5  . Smokeless tobacco: Never Used  Substance Use Topics  . Alcohol use: No  . Drug use: No     Family Hx: The patient's family history includes Cancer in his maternal grandfather; Cancer - Ovarian in his mother.   Labs/Other Tests and Data Reviewed:    EKG:  No ECG reviewed.  Recent Labs: 01/26/2020: ALT 28 01/27/2020: BUN 18; Creatinine, Ser 1.16; Hemoglobin 14.0; Platelets 171; Potassium 4.5; Sodium 141   Recent Lipid Panel No results found for: CHOL, TRIG, HDL, CHOLHDL, LDLCALC, LDLDIRECT  Wt Readings from Last 3 Encounters:  05/19/20 240 lb (108.9 kg)  03/16/20 240 lb (108.9 kg)  01/26/20 236 lb (107 kg)     Objective:    Vital Signs:  BP 110/68   Ht 5' 10.5" (1.791 m)   Wt 240 lb (108.9 kg)   BMI 33.95 kg/m  ; 110/68 yesterday VITAL SIGNS:  reviewed Pleasant male in NO acute distress. A&O x 3.  Normal Mood & Affect Non-labored respirations   ASSESSMENT & PLAN:    Problem List Items Addressed This Visit    Permanent atrial fibrillation ([place]) - Primary (Chronic)    Very asymptomatic as far as A. fib goes.  He is probably though more susceptible to syncope or near syncope if he were to be more dehydrated. He seems be doing better with having reduced his diltiazem dose and switching the time of dose to p.m. to avoid overlap with his other blood pressure medications..  Remains on Eliquis without any bleeding issues.      Essential  hypertension (Chronic)    Blood pressures are relatively stable.  Maintaining adequate hydration. Taking irbesartan in the morning and switch to diltiazem p.m.  Avoid diuretic due to prior issues with hydration.      Venous insufficiency of lower extremity (Chronic)    Pretty well controlled with compression stockings, leg elevation and exercise.  Would avoid  diuretic.      Orthostatic syncope    No further syncopal episodes.  The episode in question was clearly triggered by his inadequate hydration and sleep with the stress of watching his wife deal with her illness.  He was focused on his wife and not himself.  We talked at the importance of maintaining adequate p.o. intake and hydration.  Also, if he were to feel tired and fatigued during the day he can hold his p.m. diltiazem, however if he wakes up feeling poorly he can hold his a.m. medication (irbesartan)         COVID-19 Education: The signs and symptoms of COVID-19 were discussed with the patient and how to seek care for testing (follow up with PCP or arrange E-visit).   The importance of social distancing was discussed today.  Time:   Today, I have spent 20 minutes with the patient with telehealth technology discussing the above problems. 6-8 min charting. - 28 min total    Medication Adjustments/Labs and Tests Ordered: Current medicines are reviewed at length with the patient today.  Concerns regarding medicines are outlined above.   Patient Instructions  Medication Instructions:   No change to last instructions -- seems to be working.  *If you need a refill on your cardiac medications before your next appointment, please call your pharmacy*   Lab Work: n/a     Testing/Procedures: n/a   Follow-Up: At [facility], you and your health needs are our priority.  As part of our continuing mission to provide you with exceptional heart care, we have created designated Provider Care Teams.  These Care Teams  include your primary Cardiologist (physician) and Advanced Practice Providers (APPs -  Physician Assistants and Nurse Practitioners) who all work together to provide you with the care you need, when you need it.    Your next appointment:   8 month(s) May 2022  The format for your next appointment:   In Person  Provider:   Glenetta Hew, MD   Other Instructions Keep staying active. When sitting - elevate your feet Keep wearing support socks.     Signed, Glenetta Hew, MD  05/21/2020 11:29 PM    [place]

## 2020-05-21 ENCOUNTER — Encounter: Payer: Self-pay | Admitting: Cardiology

## 2020-05-21 NOTE — Assessment & Plan Note (Signed)
Pretty well controlled with compression stockings, leg elevation and exercise.  Would avoid diuretic.

## 2020-05-21 NOTE — Assessment & Plan Note (Signed)
No further syncopal episodes.  The episode in question was clearly triggered by his inadequate hydration and sleep with the stress of watching his wife deal with her illness.  He was focused on his wife and not himself.  We talked at the importance of maintaining adequate p.o. intake and hydration.  Also, if he were to feel tired and fatigued during the day he can hold his p.m. diltiazem, however if he wakes up feeling poorly he can hold his a.m. medication (irbesartan)

## 2020-05-21 NOTE — Assessment & Plan Note (Signed)
Very asymptomatic as far as A. fib goes.  He is probably though more susceptible to syncope or near syncope if he were to be more dehydrated. He seems be doing better with having reduced his diltiazem dose and switching the time of dose to p.m. to avoid overlap with his other blood pressure medications..  Remains on Eliquis without any bleeding issues.

## 2020-05-21 NOTE — Assessment & Plan Note (Addendum)
Blood pressures are relatively stable.  Maintaining adequate hydration. Taking irbesartan in the morning and switch to diltiazem p.m.  Avoid diuretic due to prior issues with hydration.

## 2020-06-11 ENCOUNTER — Other Ambulatory Visit: Payer: Self-pay | Admitting: Adult Health

## 2020-06-12 NOTE — Telephone Encounter (Signed)
Prescription refill request for Eliquis received. Indication:  Atrial Fibrillation Last office visit: 05/2020 Ellyn Hack Scr: 1.16  01/2020 Age: 74 Weight: 108.9 kg  Prescription refilled

## 2020-11-29 ENCOUNTER — Other Ambulatory Visit: Payer: Self-pay | Admitting: Adult Health

## 2021-03-11 ENCOUNTER — Other Ambulatory Visit: Payer: Self-pay | Admitting: Cardiology

## 2021-06-05 ENCOUNTER — Other Ambulatory Visit: Payer: Self-pay | Admitting: *Deleted

## 2021-06-05 MED ORDER — APIXABAN 5 MG PO TABS: 5.0000 mg | ORAL_TABLET | Freq: Two times a day (BID) | ORAL | 3 refills | Status: DC

## 2021-06-05 NOTE — Telephone Encounter (Signed)
Prescription refill request for Eliquis received. Indication:Afib  Last office visit:05/19/20 Ellyn Hack)  Scr: 1.86 (02/20/21)  Age: 76 Weight: 108.9kg  Pt overdue for office visit. Message sent to schedulers. Appropriate dose and refill sent to requested pharmacy.

## 2021-06-17 NOTE — Progress Notes (Deleted)
Cardiology Office Note:    Date:  06/17/2021   ID:  Alex Alvarez, DOB 1945-09-15, MRN [id number]  PCP:  Christain Sacramento, MD  Cardiologist:  Glenetta Hew, MD  Electrophysiologist:  None   Referring MD: Christain Sacramento, MD   Chief Complaint: follow-up for atrial fibrillation and orthostatic syncope  History of Present Illness:    Alex Alvarez is a 75 y.o. male with a history of chronic atrial fibrillation on Eliquis, chronic lower extremity edema due to venous insufficiency, orthostatic syncope in setting of poor PO intake,  hypertension, and hyperlipidemia, and obesity who is followed by Dr. Ellyn Hack and presents today for routine follow-up.   Last ischemic evaluation was a Myoview in 2012 which showed no evidence of ischemia. Last Echo in 2014 showed LVEF of 50-55% with normal wall motion, trivial AI, and mild MR.  Patient was admitted in 01/2020 with syncope which was felt to be due to orthostatic hypotension in the setting of poor PO intake. He was also noted to be be bradycardia at the time of syncopal episode with heart rates in the 30s so his Diltiazem was decreased. At initially follow-up visit in 03/2020, patien reported doing better with no recurrent syncope. His Spironolactone was stopped and his Diltiazem was switched to PM dosing. Patient was last seen by Dr. Ellyn Hack for a virtual visit in 05/2020 at which time he was doing much better with no recurrent syncope.  Patient presents today for follow-up. ***  Chronic Atrial Fibrillation - Rate controlled. - Continue Diltiazem 180mg  daily at night. - Continue chronic anticoagulation with Eliquis 5mg  twice daily.  Orthostatic Syncope - Occurred in 01/2020 in the setting of poor PO intake. - No recurrence.  Chronic Lower Extremity Edema - Secondary to chronic venous insufficiency.  - Stable.  - Continue HCTZ 25mg  daily.  Hypertension - BP well controlled. - Continue current medications: Diltiazem 180mg  daily, HCTZ 25mg   daily, and Irbesartan 300mg  daily.   Hyperlipidemia - Lipid panel in 01/2021: Total Cholesterol 111, Triglycerides 68, HDL 42. Direct LDL 61. - Continue Lipitor 10mg  daily.   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   Chronic atrial fibrillation ([place]) 8/132014 office note   chronic afib dates back to June 2012; CHADS2-VASc score 2; candidate for dual  antiplatelet therapy based on active C and active S STUDIES  for thromboembolic prophyaxis--actually ot on coumadin but on aspirin ad plavix   Chronic insomnia    Chronically elevated hemidiaphragm    Left   Dyslipidemia    Dysrhythmia    Former light tobacco smoker     smoked a pipe; quit 25+ years ago    Hypertension    Obesity (BMI 30.0-34.9)    Senile calcific aortic valve sclerosis 10/2012   No stenosis   Venous stasis of both lower extremities    with edema    Past Surgical History:  Procedure Laterality Date   Aorta doppler  11/12/2012   normal abdomnal aortic duplex    JOINT REPLACEMENT Left Jul 07, 2015   NM MYOVIEW LTD  07/30/2011   non-gated study seconadry to a fib;exercise capcity 7 mets--mildly reduced;low risk scan   SKIN BIOPSY N/A sept 2016   Basal cell from face.   TONSILLECTOMY     TOTAL KNEE ARTHROPLASTY Left 07/07/2015   Procedure: LEFT TOTAL KNEE ARTHROPLASTY;  Surgeon:  Netta Cedars, MD;  Location: [place];  Service: Orthopedics;  Laterality: Left;   TOTAL KNEE ARTHROPLASTY Right 03/26/2019   Procedure: TOTAL KNEE ARTHROPLASTY;  Surgeon: Netta Cedars, MD;  [facility];  Service: Orthopedics;  Laterality: Right;   TRANSTHORACIC ECHOCARDIOGRAM  02/22/2011   EF 50-55%; with mild inferior wall hypokinesis, mild left atrial enlargement of 4.2cm   TRANSTHORACIC ECHOCARDIOGRAM  11/12/2012   afib; EF 50-55% (CRO WMA b/c Afib - difficult gaiting). AoV sclerosis without stenosis    Current  Medications: No outpatient medications have been marked as taking for the 06/28/21 encounter (Appointment) with [name], PA-C.     Allergies:   Copper, Lipitor [atorvastatin], and Terbinafine   Social History   Socioeconomic History   Marital status: Married    Spouse name: Not on file   Number of children: 2   Years of education: Not on file   Highest education level: Not on file  Occupational History   Not on file  Tobacco Use   Smoking status: Former    Types: Pipe    Quit date: 10/24/2011    Years since quitting: 9.6   Smokeless tobacco: Never  Substance and Sexual Activity   Alcohol use: No   Drug use: No   Sexual activity: Not on file  Other Topics Concern   Not on file  Social History Narrative   He is a full-time Optometrist for [employer].   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   He is active and ambulatory, walking on a regular basis roughly 3-5 days a week 3 miles a time.   He used to smoke a pipe but quit 25 years ago.   Social Determinants of Health   Financial Resource Strain: Not on file  Food Insecurity: Not on file  Transportation Needs: Not on file  Physical Activity: Not on file  Stress: Not on file  Social Connections: Not on file     Family History: The patient's family history includes Cancer in his maternal grandfather; Cancer - Ovarian in his mother.  ROS:   Please see the history of present illness.     EKGs/Labs/Other Studies Reviewed:    The following studies were reviewed today:  Exercise Myoview 07/30/2011: Impressions: - Exercise capacity 7 METS. EKG is negative for ischemia. Exercise capacity is mildly reduced. - Abnormal myocardial perfusion scan demonstrating an attenuation defect in the infero-septal region of the myocardium in resting images only consistent with artifact. No ischemia or infarct/scar demonstrated. No prior study available for comparison. This is a low risk  study. _______________  Echocardiogram 11/12/2012: Study Conclusions: - Left ventricle: The cavity size was normal. Systolic    function was normal. The estimated ejection fraction was    in the range of 50% to 55%. Regional wall motion    abnormalities cannot be excluded. The study is not    technically sufficient to allow evaluation of LV diastolic    function.  - Aortic valve: Trivial regurgitation.  - Mitral valve: Mild regurgitation.  - Atrial septum: No defect or patent foramen ovale was    identified.   Impressions: - AF with controlled VR.  - Technically very difficult study. Normal pulmonary artery pressure.  EKG:  EKG ordered today. EKG personally reviewed and demonstrates ***.  Recent Labs: No results found for requested labs within last 8760 hours.  Recent Lipid Panel No results found for: CHOL, TRIG, HDL, CHOLHDL, VLDL, LDLCALC, LDLDIRECT  Physical Exam:  Vital Signs: There were no vitals taken for this visit.    Wt Readings from Last 3 Encounters:  05/19/20 240 lb (108.9 kg)  03/16/20 240 lb (108.9 kg)  01/26/20 236 lb (107 kg)     General: 75 y.o. male in no acute distress. HEENT: Normocephalic and atraumatic. Sclera clear. EOMs intact. Neck: Supple. No carotid bruits. No JVD. Heart: *** RRR. Distinct S1 and S2. No murmurs, gallops, or rubs. Radial and distal pedal pulses 2+ and equal bilaterally. Lungs: No increased work of breathing. Clear to ausculation bilaterally. No wheezes, rhonchi, or rales.  Abdomen: Soft, non-distended, and non-tender to palpation. Bowel sounds present in all 4 quadrants.  MSK: Normal strength and tone for age. *** Extremities: No lower extremity edema.    Skin: Warm and dry. Neuro: Alert and oriented x3. No focal deficits. Psych: Normal affect. Responds appropriately.   Assessment:    No diagnosis found.  Plan:     Disposition: Follow up in ***   Medication Adjustments/Labs and Tests Ordered: Current medicines  are reviewed at length with the patient today.  Concerns regarding medicines are outlined above.  No orders of the defined types were placed in this encounter.  No orders of the defined types were placed in this encounter.   There are no Patient Instructions on file for this visit.   Signed, [name], PA-C  06/17/2021 2:39 PM    [facility]

## 2021-06-28 ENCOUNTER — Encounter: Payer: Self-pay | Admitting: Student

## 2021-06-28 ENCOUNTER — Ambulatory Visit: Payer: Medicare Other | Admitting: Student

## 2021-07-08 NOTE — Progress Notes (Signed)
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   Chronic atrial fibrillation ([place])    Chronic insomnia    Chronically elevated hemidiaphragm    Left   Dyslipidemia    Former light tobacco smoker     smoked a pipe; quit 25+ years ago    Hypertension    Obesity (BMI 30.0-34.9)    Senile calcific aortic valve sclerosis 10/2012   No stenosis   Venous stasis of both lower extremities    with edema   Past Surgical History:  Procedure Laterality Date   Aorta doppler  11/12/2012   normal abdomnal aortic duplex    JOINT REPLACEMENT Left Jul 07, 2015   NM MYOVIEW LTD  07/30/2011   non-gated study seconadry to a fib;exercise capcity 7 mets--mildly reduced;low risk scan   SKIN BIOPSY N/A sept 2016   Basal cell from face.   TONSILLECTOMY     TOTAL KNEE ARTHROPLASTY Left 07/07/2015   Procedure: LEFT TOTAL KNEE ARTHROPLASTY;  Surgeon: Netta Cedars, MD;  Location: [facility];  Service: Orthopedics;  Laterality: Left;   TOTAL KNEE ARTHROPLASTY Right 03/26/2019   Procedure: TOTAL KNEE ARTHROPLASTY;  Surgeon: Netta Cedars, MD;  Location: [facility];  Service: Orthopedics;  Laterality: Right;   TRANSTHORACIC ECHOCARDIOGRAM  02/22/2011   EF 50-55%; with mild inferior wall hypokinesis, mild left atrial enlargement of 4.2cm   TRANSTHORACIC ECHOCARDIOGRAM  11/12/2012   afib; EF 50-55% (CRO WMA b/c Afib - difficult gaiting). AoV sclerosis without stenosis    Allergies  Allergies  Allergen Reactions   Copper Rash   Lipitor [Atorvastatin] Other (See Comments)    Leg  cramps- pt takes 10mg  daily at home   Terbinafine Rash    History of Present Illness    Alex Alvarez has a past medical history of atrial fibrillation, hypertension, hyperlipidemia, chronic lower extremity edema, and loss of consciousness.  He was hospitalized May 2021 for syncope.  He followed up on 03/16/2020.  During that time it was felt that his syncope was related to orthostatic hypotension.  He has been visiting his wife in the ICU.  He had not been eating or drinking well.  He had also not been sleeping well.  He was very preoccupied with his wife's health.  He reported having an episode of postural dizziness even before presenting to the hospital.  He was last seen by Dr. Ellyn Hack on 05/19/2020.  During that time they reported that he was feeling much stronger.  He had been eating better and drinking better.  He reported that his wife was also healthier.  He denied further episodes of syncope and did not note his atrial fibrillation.  He presents the clinic today for follow-up evaluation states he feels well.  He continues to stay well-hydrated and denies further episodes of presyncope/syncope.  He reports that he enjoys spending time with his daughter and granddaughter.  His daughter has just purchased a cord her horse for his granddaughter to ride.  His daughter has been working with horses since  she was young.  He reports compliance with his apixaban and denies bleeding issues.  He does occasionally notice his atrial fibrillation.  He notices palpitations occasionally when he is seated at rest.  He does not notice that his chest discomfort when he is active.  We reviewed his EKG.  His blood pressure is well controlled.  I will order CBC, BMP, fasting lipids and LFTs.  We will give a salty 6 diet sheet and plan to see him back in 12 months.  Today he denies chest pain, shortness of breath, lower extremity edema, fatigue, palpitations, melena, hematuria, hemoptysis, diaphoresis, weakness,  presyncope, syncope, orthopnea, and PND.   Home Medications    Prior to Admission medications   Medication Sig Start Date End Date Taking? Authorizing Provider  amoxicillin (AMOXIL) 500 MG tablet Take 2,000 mg by mouth as directed. Take 4 capsules by mouth 1 hour prior to dental appt 09/27/19   [provider]  apixaban (ELIQUIS) 5 MG TABS tablet Take 1 tablet (5 mg total) by mouth 2 (two) times daily. 06/05/21   Leonie Man, MD  aspirin EC 81 MG tablet Take 81 mg by mouth daily.     [provider]  atorvastatin (LIPITOR) 10 MG tablet Take 1 tablet by mouth  daily 04/04/16   Leonie Man, MD  carboxymethylcellulose (REFRESH PLUS) 0.5 % SOLN Place 1 drop into both eyes 2 (two) times daily as needed (dry eyes).    [provider]  diclofenac (VOLTAREN) 75 MG EC tablet Take 75 mg by mouth daily.     [provider]  diltiazem (CARDIZEM CD) 180 MG 24 hr capsule TAKE 1 CAPSULE BY MOUTH EVERY DAY 03/12/21   Leonie Man, MD  EPINEPHrine (EPIPEN 2-PAK) 0.3 mg/0.3 mL IJ SOAJ injection Inject 0.3 mg into the muscle as needed for anaphylaxis.     [provider]  hydrochlorothiazide (HYDRODIURIL) 25 MG tablet TAKE 1 TABLET BY MOUTH EVERY DAY 11/29/20   Leonie Man, MD  irbesartan (AVAPRO) 300 MG tablet Take 1 tablet (300 mg total) by mouth daily. 12/24/19   Lendon Colonel, NP  Multiple Vitamins-Minerals (MULTIVITAMIN WITH MINERALS) tablet Take 1 tablet by mouth daily.    [provider]  traZODone (DESYREL) 100 MG tablet Take 50-100 mg by mouth at bedtime as needed for sleep (if Zolpidem doesn't work).  07/07/19   [provider]  zolpidem (AMBIEN) 10 MG tablet Take 10 mg by mouth at bedtime. 02/15/20   [provider]    Family History    Family History  Problem Relation Age of Onset   Cancer - Ovarian Mother    Cancer Maternal Grandfather    He indicated that his mother is deceased. He indicated that his  father is deceased. He indicated that only one of his two sisters is alive. He indicated that his brother is alive. He indicated that his maternal grandmother is deceased. He indicated that his maternal grandfather is deceased. He indicated that his paternal grandmother is deceased. He indicated that his paternal grandfather is deceased. He indicated that both of his children are alive.  Social History    Social History   Socioeconomic History   Marital status: Married    Spouse name: Not on file   Number of children: 2   Years of education: Not on file   Highest education level: Not on file  Occupational History   Not on file  Tobacco Use   Smoking status:  Former    Types: Pipe    Quit date: 10/24/2011    Years since quitting: 9.7   Smokeless tobacco: Never  Substance and Sexual Activity   Alcohol use: No   Drug use: No   Sexual activity: Not on file  Other Topics Concern   Not on file  Social History Narrative   He is a full-time Optometrist for [employer].   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   He is active and ambulatory, walking on a regular basis roughly 3-5 days a week 3 miles a time.   He used to smoke a pipe but quit 25 years ago.   Social Determinants of Health   Financial Resource Strain: Not on file  Food Insecurity: Not on file  Transportation Needs: Not on file  Physical Activity: Not on file  Stress: Not on file  Social Connections: Not on file  Intimate Partner Violence: Not on file     Review of Systems    General:  No chills, fever, night sweats or weight changes.  Cardiovascular:  No chest pain, dyspnea on exertion, edema, orthopnea, palpitations, paroxysmal nocturnal dyspnea. Dermatological: No rash, lesions/masses Respiratory: No cough, dyspnea Urologic: No hematuria, dysuria Abdominal:   No nausea, vomiting, diarrhea, bright red blood per rectum, melena, or hematemesis Neurologic:  No visual changes, wkns, changes in mental  status. All other systems reviewed and are otherwise negative except as noted above.  Physical Exam    VS:  There were no vitals taken for this visit. , BMI There is no height or weight on file to calculate BMI. GEN: Well nourished, well developed, in no acute distress. HEENT: normal. Neck: Supple, no JVD, carotid bruits, or masses. Cardiac: RRR, no murmurs, rubs, or gallops. No clubbing, cyanosis, edema.  Radials/DP/PT 2+ and equal bilaterally.  Respiratory:  Respirations regular and unlabored, clear to auscultation bilaterally. GI: Soft, nontender, nondistended, BS + x 4. MS: no deformity or atrophy. Skin: warm and dry, no rash. Neuro:  Strength and sensation are intact. Psych: Normal affect.  Accessory Clinical Findings    Recent Labs: No results found for requested labs within last 8760 hours.   Recent Lipid Panel No results found for: CHOL, TRIG, HDL, CHOLHDL, VLDL, LDLCALC, LDLDIRECT  ECG personally reviewed by me today-atrial fibrillation 68 bpm- No acute changes  Echocardiogram 11/12/2012  Study Conclusions   - Left ventricle: The cavity size was normal. Systolic    function was normal. The estimated ejection fraction was    in the range of 50% to 55%. Regional wall motion    abnormalities cannot be excluded. The study is not    technically sufficient to allow evaluation of LV diastolic    function.  - Aortic valve: Trivial regurgitation.  - Mitral valve: Mild regurgitation.  - Atrial septum: No defect or patent foramen ovale was    identified.  Impressions:   - AF with controlled VR.    Technically very difficult study. Normal pulmonary artery    pressure.  Transthoracic echocardiography.  M-mode, complete 2D,  spectral Doppler, and color Doppler.  Height:  Height:  185.4cm. Height: 73in.  Weight:  Weight: 105.7kg. Weight:  232.5lb.  Body mass index:  BMI: 30.7kg/m^2.  Body surface  area:    BSA: 2.60m^2.  Blood pressure:     140/70.  Patient  status:   Outpatient.  Location:  [facility].    Assessment & Plan   1.  Atrial fibrillation-EKG today shows atrial  fibrillation 68 bpm.  Cardiac unaware.  Denies episodes of irregular or accelerated heartbeat.  Reports compliance with apixaban.  Denies bleeding issues. Continue apixaban, Cardizem Heart healthy low-sodium diet-salty 6 given Increase physical activity as tolerated   Syncope-has not had further episodes of lightheadedness or dizziness.  Previous syncopal episode happened in the setting of poor nutrition and hydration.  His wife had been admitted to the ICU and he was preoccupied with/concerns with her wellbeing. Continue diet and hydration  Essential hypertension-BP today 100/76.  Well-controlled at home. Continue HCTZ, irbesartan, diltiazem Heart healthy low-sodium diet-salty 6 given Increase physical activity as tolerated BMP, CBC  Hyperlipidemia- reports compliance with statin therapy. Continue aspirin, atorvastatin Heart healthy low-sodium high-fiber diet Increase physical activity as tolerated Repeat fasting lipids and LFTs  Disposition: Follow-up with Dr. Ellyn Hack in 12 months.   Jossie Ng. Evadene Wardrip NP-C    07/08/2021, 12:47 PM [place] [address] Office [phone] Fax [phone]  Notice: This dictation was prepared with Dragon dictation along with smaller phrase technology. Any transcriptional errors that result from this process are unintentional and may not be corrected upon review.  I spent 14 minutes examining this patient, reviewing medications, and using patient centered shared decision making involving her cardiac care.  Prior to her visit I spent greater than 20 minutes reviewing her past medical history,  medications, and prior cardiac tests.

## 2021-07-09 ENCOUNTER — Encounter: Payer: Self-pay | Admitting: General Practice

## 2021-07-09 ENCOUNTER — Ambulatory Visit: Payer: Medicare Other | Admitting: General Practice

## 2021-07-09 ENCOUNTER — Other Ambulatory Visit: Payer: Self-pay

## 2021-07-09 VITALS — BP 100/76 | HR 68 | Ht 71.0 in | Wt 239.2 lb

## 2021-07-09 DIAGNOSIS — I4821 Permanent atrial fibrillation: Secondary | ICD-10-CM

## 2021-07-09 DIAGNOSIS — I10 Essential (primary) hypertension: Secondary | ICD-10-CM

## 2021-07-09 DIAGNOSIS — I951 Orthostatic hypotension: Secondary | ICD-10-CM

## 2021-07-09 DIAGNOSIS — E785 Hyperlipidemia, unspecified: Secondary | ICD-10-CM

## 2021-07-09 DIAGNOSIS — Z79899 Other long term (current) drug therapy: Secondary | ICD-10-CM

## 2021-07-09 LAB — CBC
Hematocrit: 41.6 % (ref 37.5–51.0)
Hemoglobin: 14.2 g/dL (ref 13.0–17.7)
MCH: 33 pg (ref 26.6–33.0)
MCHC: 34.1 g/dL (ref 31.5–35.7)
MCV: 97 fL (ref 79–97)
Platelets: 163 10*3/uL (ref 150–450)
RBC: 4.3 x10E6/uL (ref 4.14–5.80)
RDW: 11.1 % — ABNORMAL LOW (ref 11.6–15.4)
WBC: 5.1 10*3/uL (ref 3.4–10.8)

## 2021-07-09 LAB — BASIC METABOLIC PANEL
BUN/Creatinine Ratio: 17 (ref 10–24)
BUN: 25 mg/dL (ref 8–27)
CO2: 27 mmol/L (ref 20–29)
Calcium: 9.3 mg/dL (ref 8.6–10.2)
Chloride: 101 mmol/L (ref 96–106)
Creatinine, Ser: 1.5 mg/dL — ABNORMAL HIGH (ref 0.76–1.27)
Glucose: 103 mg/dL — ABNORMAL HIGH (ref 70–99)
Potassium: 4.8 mmol/L (ref 3.5–5.2)
Sodium: 140 mmol/L (ref 134–144)
eGFR: 48 mL/min/{1.73_m2} — ABNORMAL LOW (ref >59)

## 2021-07-09 LAB — HEPATIC FUNCTION PANEL
ALT: 42 IU/L (ref 0–44)
AST: 28 IU/L (ref 0–40)
Albumin: 4.4 g/dL (ref 3.7–4.7)
Alkaline Phosphatase: 89 IU/L (ref 44–121)
Bilirubin Total: 0.7 mg/dL (ref 0.0–1.2)
Bilirubin, Direct: 0.2 mg/dL (ref 0.00–0.40)
Total Protein: 6.3 g/dL (ref 6.0–8.5)

## 2021-07-09 LAB — LIPID PANEL
Chol/HDL Ratio: 2.8 ratio (ref 0.0–5.0)
Cholesterol, Total: 121 mg/dL (ref 100–199)
HDL: 44 mg/dL (ref >39)
LDL Chol Calc (NIH): 63 mg/dL (ref 0–99)
Triglycerides: 65 mg/dL (ref 0–149)
VLDL Cholesterol Cal: 14 mg/dL (ref 5–40)

## 2021-07-09 NOTE — Patient Instructions (Signed)
Medication Instructions:  The current medical regimen is effective;  continue present plan and medications as directed. Please refer to the Current Medication list given to you today.  *If you need a refill on your cardiac medications before your next appointment, please call your pharmacy*  Lab Work: CBC.BMET,LIPID,LFT TODAY If you have labs (blood work) drawn today and your tests are completely normal, you will receive your results only by:  [facility] (if you have MyChart) OR A paper copy in the mail.  If you have any lab test that is abnormal or we need to change your treatment, we will call you to review the results. You may go to any [facility] that is convenient for you however, we do have a lab in our office that is able to assist you. You DO NOT need an appointment for our lab. The lab is open 8:00am and closes at 4:00pm. Lunch 12:45 - 1:45pm.  Special Instructions Please try to avoid these triggers: Do not use any products that have nicotine or tobacco in them. These include cigarettes, e-cigarettes, and chewing tobacco. If you need help quitting, ask your doctor. Eat heart-healthy foods. Talk with your doctor about the right eating plan for you. Exercise regularly as told by your doctor. Stay hydrated Do not drink alcohol, Caffeine or chocolate. Lose weight if you are overweight. Do not use drugs, including cannabis   PLEASE READ AND FOLLOW SALTY 6-ATTACHED-1,800 mg daily  Follow-Up: Your next appointment:  12 month(s) In Person with Glenetta Hew, MD  OR IF UNAVAILABLE [place], FNP-C   Please call our office 2 months in advance to schedule this appointment   At [facility], you and your health needs are our priority.  As part of our continuing mission to provide you with exceptional heart care, we have created designated Provider Care Teams.  These Care Teams include your primary Cardiologist (physician) and Advanced Practice Providers (APPs -  Physician Assistants and  Nurse Practitioners) who all work together to provide you with the care you need, when you need it.  We recommend signing up for the patient portal called "MyChart".  Sign up information is provided on this After Visit Summary.  MyChart is used to connect with patients for Virtual Visits (Telemedicine).  Patients are able to view lab/test results, encounter notes, upcoming appointments, etc.  Non-urgent messages can be sent to your provider as well.   To learn more about what you can do with MyChart, go to [email or web address].

## 2021-07-12 ENCOUNTER — Other Ambulatory Visit: Payer: Self-pay

## 2021-07-12 MED ORDER — TRAZODONE HCL 100 MG PO TABS: 50.0000 mg | ORAL_TABLET | Freq: Every evening | ORAL | 0 refills | Status: AC | PRN

## 2021-07-13 ENCOUNTER — Other Ambulatory Visit: Payer: Self-pay | Admitting: Adult Health

## 2021-07-17 ENCOUNTER — Other Ambulatory Visit: Payer: Self-pay

## 2021-07-17 DIAGNOSIS — R600 Localized edema: Secondary | ICD-10-CM

## 2021-07-17 DIAGNOSIS — I10 Essential (primary) hypertension: Secondary | ICD-10-CM

## 2021-07-17 DIAGNOSIS — Z79899 Other long term (current) drug therapy: Secondary | ICD-10-CM

## 2021-07-17 DIAGNOSIS — I872 Venous insufficiency (chronic) (peripheral): Secondary | ICD-10-CM

## 2021-07-17 DIAGNOSIS — I951 Orthostatic hypotension: Secondary | ICD-10-CM

## 2021-07-17 DIAGNOSIS — I4821 Permanent atrial fibrillation: Secondary | ICD-10-CM

## 2021-07-17 DIAGNOSIS — I4819 Other persistent atrial fibrillation: Secondary | ICD-10-CM

## 2021-07-17 DIAGNOSIS — E785 Hyperlipidemia, unspecified: Secondary | ICD-10-CM

## 2021-07-30 ENCOUNTER — Other Ambulatory Visit: Payer: Self-pay

## 2021-07-30 DIAGNOSIS — I951 Orthostatic hypotension: Secondary | ICD-10-CM

## 2021-07-30 DIAGNOSIS — I872 Venous insufficiency (chronic) (peripheral): Secondary | ICD-10-CM

## 2021-07-30 DIAGNOSIS — R600 Localized edema: Secondary | ICD-10-CM

## 2021-07-30 DIAGNOSIS — E785 Hyperlipidemia, unspecified: Secondary | ICD-10-CM

## 2021-07-30 DIAGNOSIS — I4819 Other persistent atrial fibrillation: Secondary | ICD-10-CM

## 2021-07-30 DIAGNOSIS — Z79899 Other long term (current) drug therapy: Secondary | ICD-10-CM

## 2021-07-30 DIAGNOSIS — I10 Essential (primary) hypertension: Secondary | ICD-10-CM

## 2021-07-30 DIAGNOSIS — I4821 Permanent atrial fibrillation: Secondary | ICD-10-CM

## 2021-07-31 LAB — BASIC METABOLIC PANEL
BUN/Creatinine Ratio: 14 (ref 10–24)
BUN: 18 mg/dL (ref 8–27)
CO2: 25 mmol/L (ref 20–29)
Calcium: 9.5 mg/dL (ref 8.6–10.2)
Chloride: 102 mmol/L (ref 96–106)
Creatinine, Ser: 1.32 mg/dL — ABNORMAL HIGH (ref 0.76–1.27)
Glucose: 118 mg/dL — ABNORMAL HIGH (ref 70–99)
Potassium: 4.9 mmol/L (ref 3.5–5.2)
Sodium: 140 mmol/L (ref 134–144)
eGFR: 56 mL/min/{1.73_m2} — ABNORMAL LOW (ref >59)

## 2021-09-11 ENCOUNTER — Encounter: Payer: Self-pay | Admitting: Cardiology

## 2021-10-01 ENCOUNTER — Other Ambulatory Visit: Payer: Self-pay

## 2021-10-01 ENCOUNTER — Ambulatory Visit (INDEPENDENT_AMBULATORY_CARE_PROVIDER_SITE_OTHER): Payer: Medicare Other

## 2021-10-01 ENCOUNTER — Ambulatory Visit: Payer: Medicare Other | Admitting: Podiatry

## 2021-10-01 DIAGNOSIS — M21619 Bunion of unspecified foot: Secondary | ICD-10-CM

## 2021-10-01 DIAGNOSIS — M2012 Hallux valgus (acquired), left foot: Secondary | ICD-10-CM

## 2021-10-01 DIAGNOSIS — L97522 Non-pressure chronic ulcer of other part of left foot with fat layer exposed: Secondary | ICD-10-CM | POA: Diagnosis not present

## 2021-10-01 DIAGNOSIS — M2041 Other hammer toe(s) (acquired), right foot: Secondary | ICD-10-CM

## 2021-10-01 NOTE — Progress Notes (Signed)
Subjective:  76 y.o. male presenting today for new complaint regarding a blister that developed to the left second toe over the past few days.  Patient is unsure exactly when it began.  He is concerned because he believes his great toe is pushing against the second toe causing the blister.  He denies any change in shoe gear or activity recently.  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   Chronic atrial fibrillation ([facility])    Chronic insomnia    Chronically elevated hemidiaphragm    Left   Dyslipidemia    Former light tobacco smoker     smoked a pipe; quit 25+ years ago    Hypertension    Obesity (BMI 30.0-34.9)    Senile calcific aortic valve sclerosis 10/2012   No stenosis   Venous stasis of both lower extremities    with edema     Past Surgical History:  Procedure Laterality Date   Aorta doppler  11/12/2012   normal abdomnal aortic duplex    JOINT REPLACEMENT Left Jul 07, 2015   NM MYOVIEW LTD  07/30/2011   non-gated study seconadry to a fib;exercise capcity 7 mets--mildly reduced;low risk scan   SKIN BIOPSY N/A sept 2016   Basal cell from face.   TONSILLECTOMY     TOTAL KNEE ARTHROPLASTY Left 07/07/2015   Procedure: LEFT TOTAL KNEE ARTHROPLASTY;  Surgeon: Netta Cedars, MD;  Location: [facility];  Service: Orthopedics;  Laterality: Left;   TOTAL KNEE ARTHROPLASTY Right 03/26/2019   Procedure: TOTAL KNEE ARTHROPLASTY;  Surgeon: Netta Cedars, MD;  [facility];  Service: Orthopedics;  Laterality: Right;   TRANSTHORACIC ECHOCARDIOGRAM  02/22/2011   EF 50-55%; with mild inferior wall hypokinesis, mild left atrial enlargement of 4.2cm   TRANSTHORACIC ECHOCARDIOGRAM  11/12/2012   afib; EF 50-55% (CRO WMA b/c Afib - difficult gaiting). AoV sclerosis without stenosis   Allergies  Allergen Reactions   Copper Rash    Terbinafine Rash      Objective/Physical Exam General: The patient is alert and oriented x3 in no acute distress.  Dermatology:  Wound #1 noted to the distal medial portion of the left second toe measuring approximately 1.0x1.0 x 0.1 cm (LxWxD).  Please see above noted picture  To the noted ulceration(s), there is no eschar. There is a moderate amount of slough and fibrous tissue noted. Granulation tissue and wound base is red. There is a minimal amount of serosanguineous drainage noted. There is no exposed bone muscle-tendon ligament or joint. There is no malodor. Periwound integrity is intact. Skin is warm, dry and supple bilateral lower extremities.  Vascular: Palpable pedal pulses bilaterally. Mild edema noted. Capillary refill within normal limits. Varicosities noted bilateral lower extremities.   Neurological: Epicritic and protective threshold diminished bilaterally.   Musculoskeletal Exam: Mild hallux valgus deformity noted with hallux abductus pushing against the second toe  Radiographic exam LT foot: Advanced degenerative changes with erosion and radiolucencies throughout the first MTP joint of the foot.  Clinically this is asymptomatic.  No osseous erosions or cortical irregularities around the second toe which is the area of concern.  Assessment: #1  Ulcer left second toe secondary #2  Mild hallux valgus deformity left  Plan of Care:  #1 Patient was evaluated. #2 medically necessary excisional debridement including subcutaneous tissue was performed  using a tissue nipper and a chisel blade. Excisional debridement of all the necrotic nonviable tissue down to healthy bleeding viable tissue was performed with post-debridement measurements same as pre-. #3  Betadine ointment and a light dressing was applied to the wound.  Betadine ointment provided to apply daily with a Band-Aid #4 toe spacer was provided to alleviate pressure from the great toe pushing on the second toe.  Wear  daily #5 return to clinic in 2 weeks    Edrick Kins, DPM [facility]  Dr. Edrick Kins, DPM    [address], [place] [address]                Office [phone]  Fax [phone]

## 2021-10-15 ENCOUNTER — Other Ambulatory Visit: Payer: Self-pay

## 2021-10-15 ENCOUNTER — Ambulatory Visit (INDEPENDENT_AMBULATORY_CARE_PROVIDER_SITE_OTHER): Payer: Medicare Other | Admitting: Podiatry

## 2021-10-15 DIAGNOSIS — L97522 Non-pressure chronic ulcer of other part of left foot with fat layer exposed: Secondary | ICD-10-CM

## 2021-10-15 NOTE — Progress Notes (Signed)
Subjective:  76 y.o. male presenting today for follow-up evaluation of an ulcer that developed to the patient's left second toe.  Overall there has been significant improvement.  He has been applying the Betadine as instructed and wearing the silicone toe spacer to alleviate pressure from the great toe.  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   Chronic atrial fibrillation ([facility])    Chronic insomnia    Chronically elevated hemidiaphragm    Left   Dyslipidemia    Former light tobacco smoker     smoked a pipe; quit 25+ years ago    Hypertension    Obesity (BMI 30.0-34.9)    Senile calcific aortic valve sclerosis 10/2012   No stenosis   Venous stasis of both lower extremities    with edema     Past Surgical History:  Procedure Laterality Date   Aorta doppler  11/12/2012   normal abdomnal aortic duplex    JOINT REPLACEMENT Left Jul 07, 2015   NM MYOVIEW LTD  07/30/2011   non-gated study seconadry to a fib;exercise capcity 7 mets--mildly reduced;low risk scan   SKIN BIOPSY N/A sept 2016   Basal cell from face.   TONSILLECTOMY     TOTAL KNEE ARTHROPLASTY Left 07/07/2015   Procedure: LEFT TOTAL KNEE ARTHROPLASTY;  Surgeon: Netta Cedars, MD;  Location: [facility];  Service: Orthopedics;  Laterality: Left;   TOTAL KNEE ARTHROPLASTY Right 03/26/2019   Procedure: TOTAL KNEE ARTHROPLASTY;  Surgeon: Netta Cedars, MD;  [facility];  Service: Orthopedics;  Laterality: Right;   TRANSTHORACIC ECHOCARDIOGRAM  02/22/2011   EF 50-55%; with mild inferior wall hypokinesis, mild left atrial enlargement of 4.2cm   TRANSTHORACIC ECHOCARDIOGRAM  11/12/2012   afib; EF 50-55% (CRO WMA b/c Afib - difficult gaiting). AoV sclerosis without stenosis   Allergies  Allergen Reactions   Copper Rash   Terbinafine Rash    Objective/Physical  Exam General: The patient is alert and oriented x3 in no acute distress.  Dermatology:  Wound #1 noted to the distal medial portion of the left second toe measuring approximately 0.4 x 0.4 x 0.1 cm.  Overall there is significant improvement of the toe.  No erythema or edema noted.  Minimal serosanguineous drainage noted.  Wound base is granular with some small fibrotic debris.  Vascular: Palpable pedal pulses bilaterally. Mild edema noted. Capillary refill within normal limits. Varicosities noted bilateral lower extremities.   Neurological: Epicritic and protective threshold diminished bilaterally.   Musculoskeletal Exam: Mild hallux valgus deformity noted with hallux abductus pushing against the second toe   Assessment: #1  Ulcer left second toe secondary #2  Mild hallux valgus deformity left  Plan of Care:  #1 Patient was evaluated. #2 medically necessary excisional debridement including subcutaneous tissue was performed using a tissue nipper and a chisel blade. Excisional debridement of all the necrotic nonviable tissue down to healthy bleeding viable tissue was performed with post-debridement measurements same as pre-. #3 continue Betadine daily to the wound with a Band-Aid. #4 continue silicone toe spacer #5 additional debridement of the left hallux nail plate was performed using a nail nipper. #6 return to clinic in 3 weeks    Edrick Kins, DPM [facility]  Dr. Edrick Kins, DPM    [address]  [place], [place] [address]                Office [phone]  Fax [phone]

## 2021-11-05 ENCOUNTER — Ambulatory Visit: Payer: Medicare Other | Admitting: Podiatry

## 2021-11-12 ENCOUNTER — Ambulatory Visit: Payer: Medicare Other | Admitting: Podiatry

## 2021-11-12 ENCOUNTER — Other Ambulatory Visit: Payer: Self-pay

## 2021-11-12 DIAGNOSIS — L97522 Non-pressure chronic ulcer of other part of left foot with fat layer exposed: Secondary | ICD-10-CM

## 2021-11-12 NOTE — Progress Notes (Signed)
? ?Subjective:  ?76 y.o. male presenting today for follow-up evaluation of an ulcer that developed to the patient's left second toe.  Patient states the wounds have improved significantly.  He continues to wear the silicone toe spacer.  No new complaints at this time ? ?Past Medical History:  ?Diagnosis Date  ? Bilateral lower extremity edema   ? Cancer ([place]) 1982 and 1983  ? 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 Chronic atrial fibrillation (HCC)   ? Chronic insomnia   ? Chronically elevated hemidiaphragm   ? Left  ? Dyslipidemia   ? Former light tobacco smoker   ?  smoked a pipe; quit 25+ years ago   ? Hypertension   ? Obesity (BMI 30.0-34.9)   ? 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 No stenosis  ? Venous stasis of both lower extremities   ? with edema  ? ?  ?Past Surgical History:  ?Procedure Laterality Date  ? Aorta doppler  11/12/2012  ? normal abdomnal aortic duplex   ? JOINT REPLACEMENT Left Jul 07, 2015  ? NM MYOVIEW LTD  07/30/2011  ? non-gated study seconadry to a fib;exercise capcity 7 mets--mildly reduced;low risk scan  ? SKIN BIOPSY N/A sept 2016  ? Basal cell from face.  ? TONSILLECTOMY    ? TOTAL KNEE ARTHROPLASTY Left 07/07/2015  ? Procedure: LEFT TOTAL KNEE ARTHROPLASTY;  Surgeon: Netta Cedars, MD;  Location: [facility];  Service: Orthopedics;  Laterality: Left;  ? TOTAL KNEE ARTHROPLASTY Right 03/26/2019  ? Procedure: TOTAL KNEE ARTHROPLASTY;  Surgeon: Netta Cedars, MD;  Location: [facility];  Service: Orthopedics;  Laterality: Right;  ? TRANSTHORACIC ECHOCARDIOGRAM  02/22/2011  ? EF 50-55%; with mild inferior wall hypokinesis, mild left atrial enlargement of 4.2cm  ? TRANSTHORACIC ECHOCARDIOGRAM  11/12/2012  ? afib; EF 50-55% (CRO WMA b/c Afib - difficult gaiting). AoV sclerosis without stenosis  ? ?Allergies  ?Allergen Reactions  ? Copper Rash  ? Terbinafine Rash  ? ? ?Objective/Physical Exam ?General: The patient is alert and oriented x3 in no acute distress. ? ?Dermatology:   ?Wound #1 noted to the distal medial portion of the left second toe has healed.  Complete reepithelialization has occurred.  No open wounds noted ? ?Vascular: Palpable pedal pulses bilaterally. Mild edema noted. Capillary refill within normal limits. Varicosities noted bilateral lower extremities.  ? ?Neurological: Epicritic and protective threshold diminished bilaterally.  ? ?Musculoskeletal Exam: Mild hallux valgus deformity noted with hallux abductus pushing against the second toe ? ? ?Assessment: ?#1  Ulcer left second toe secondary; healed ?#2  Mild hallux valgus deformity left ? ?Plan of Care:  ?#1 Patient was evaluated. ?#2  The wounds to the second toe have healed completely.  No open wounds noted.  There is some slight hyperkeratotic callus and skin around the freshly healed wounds that was lightly debrided today using a tissue nipper without incident or bleeding ?#3 continue silicone toe spacers ?#4 return to clinic as needed ? ? ?Edrick Kins, DPM ?[facility] ? ?Dr. Edrick Kins, DPM  ?  ?[address]                                        ?[place], [place] [address]                ?Office [phone]  ?Fax [phone]  [phone] ? ? ? ? ? ?

## 2022-03-11 ENCOUNTER — Other Ambulatory Visit: Payer: Self-pay | Admitting: Cardiology

## 2022-03-11 DIAGNOSIS — I4821 Permanent atrial fibrillation: Secondary | ICD-10-CM

## 2022-03-11 NOTE — Telephone Encounter (Signed)
Prescription refill request for Eliquis received. Indication: Afib  Last office visit: 07/09/21 Marilynn Rail)  Scr: 1.39 (08/23/21)  Age: 76 Weight: 108.5kg  Appropriate dose and refill sent to requested pharmacy.

## 2022-04-05 ENCOUNTER — Emergency Department (HOSPITAL_COMMUNITY): Payer: Medicare Other

## 2022-04-05 ENCOUNTER — Emergency Department (HOSPITAL_COMMUNITY)
Admission: EM | Admit: 2022-04-05 | Discharge: 2022-04-05 | Disposition: A | Discharge status: Home / Self Care | Payer: Medicare Other | Attending: Student | Admitting: Student

## 2022-04-05 ENCOUNTER — Other Ambulatory Visit: Payer: Self-pay

## 2022-04-05 ENCOUNTER — Encounter (HOSPITAL_COMMUNITY): Payer: Self-pay

## 2022-04-05 DIAGNOSIS — Z85828 Personal history of other malignant neoplasm of skin: Secondary | ICD-10-CM | POA: Insufficient documentation

## 2022-04-05 DIAGNOSIS — Z7901 Long term (current) use of anticoagulants: Secondary | ICD-10-CM | POA: Diagnosis not present

## 2022-04-05 DIAGNOSIS — Z7982 Long term (current) use of aspirin: Secondary | ICD-10-CM | POA: Insufficient documentation

## 2022-04-05 DIAGNOSIS — R350 Frequency of micturition: Secondary | ICD-10-CM | POA: Insufficient documentation

## 2022-04-05 DIAGNOSIS — Z87891 Personal history of nicotine dependence: Secondary | ICD-10-CM | POA: Diagnosis not present

## 2022-04-05 DIAGNOSIS — E860 Dehydration: Secondary | ICD-10-CM

## 2022-04-05 DIAGNOSIS — R55 Syncope and collapse: Secondary | ICD-10-CM | POA: Insufficient documentation

## 2022-04-05 DIAGNOSIS — Z96651 Presence of right artificial knee joint: Secondary | ICD-10-CM | POA: Diagnosis not present

## 2022-04-05 DIAGNOSIS — Z85858 Personal history of malignant neoplasm of other endocrine glands: Secondary | ICD-10-CM | POA: Insufficient documentation

## 2022-04-05 DIAGNOSIS — Z79899 Other long term (current) drug therapy: Secondary | ICD-10-CM | POA: Insufficient documentation

## 2022-04-05 DIAGNOSIS — I10 Essential (primary) hypertension: Secondary | ICD-10-CM | POA: Insufficient documentation

## 2022-04-05 LAB — COMPREHENSIVE METABOLIC PANEL
ALT: 22 U/L (ref 0–44)
AST: 18 U/L (ref 15–41)
Albumin: 3.6 g/dL (ref 3.5–5.0)
Alkaline Phosphatase: 62 U/L (ref 38–126)
Anion gap: 5 (ref 5–15)
BUN: 28 mg/dL — ABNORMAL HIGH (ref 8–23)
CO2: 28 mmol/L (ref 22–32)
Calcium: 8.4 mg/dL — ABNORMAL LOW (ref 8.9–10.3)
Chloride: 104 mmol/L (ref 98–111)
Creatinine, Ser: 1.31 mg/dL — ABNORMAL HIGH (ref 0.61–1.24)
GFR, Estimated: 56 mL/min — ABNORMAL LOW (ref >60)
Glucose, Bld: 150 mg/dL — ABNORMAL HIGH (ref 70–99)
Potassium: 4.1 mmol/L (ref 3.5–5.1)
Sodium: 137 mmol/L (ref 135–145)
Total Bilirubin: 1.1 mg/dL (ref 0.3–1.2)
Total Protein: 5.8 g/dL — ABNORMAL LOW (ref 6.5–8.1)

## 2022-04-05 LAB — CBC WITH DIFFERENTIAL/PLATELET
Abs Immature Granulocytes: 0.02 10*3/uL (ref 0.00–0.07)
Basophils Absolute: 0 10*3/uL (ref 0.0–0.1)
Basophils Relative: 1 %
Eosinophils Absolute: 0.1 10*3/uL (ref 0.0–0.5)
Eosinophils Relative: 2 %
HCT: 42.1 % (ref 39.0–52.0)
Hemoglobin: 13.7 g/dL (ref 13.0–17.0)
Immature Granulocytes: 0 %
Lymphocytes Relative: 9 %
Lymphs Abs: 0.6 10*3/uL — ABNORMAL LOW (ref 0.7–4.0)
MCH: 34 pg (ref 26.0–34.0)
MCHC: 32.5 g/dL (ref 30.0–36.0)
MCV: 104.5 fL — ABNORMAL HIGH (ref 80.0–100.0)
Monocytes Absolute: 0.6 10*3/uL (ref 0.1–1.0)
Monocytes Relative: 10 %
Neutro Abs: 5.1 10*3/uL (ref 1.7–7.7)
Neutrophils Relative %: 78 %
Platelets: 142 10*3/uL — ABNORMAL LOW (ref 150–400)
RBC: 4.03 MIL/uL — ABNORMAL LOW (ref 4.22–5.81)
RDW: 11.8 % (ref 11.5–15.5)
WBC: 6.5 10*3/uL (ref 4.0–10.5)
nRBC: 0 % (ref 0.0–0.2)

## 2022-04-05 LAB — URINALYSIS, ROUTINE W REFLEX MICROSCOPIC
Bilirubin Urine: NEGATIVE
Glucose, UA: NEGATIVE mg/dL
Hgb urine dipstick: NEGATIVE
Ketones, ur: 5 mg/dL — AB
Leukocytes,Ua: NEGATIVE
Nitrite: NEGATIVE
Protein, ur: NEGATIVE mg/dL
Specific Gravity, Urine: 1.013 (ref 1.005–1.030)
pH: 6 (ref 5.0–8.0)

## 2022-04-05 LAB — TROPONIN I (HIGH SENSITIVITY): Troponin I (High Sensitivity): 5 ng/L (ref <18)

## 2022-04-05 MED ORDER — LACTATED RINGERS IV BOLUS
1000.0000 mL | Freq: Once | INTRAVENOUS | Status: AC
  Administered 2022-04-05: 1000 mL via INTRAVENOUS

## 2022-04-05 NOTE — ED Triage Notes (Signed)
Patient BIBA from home c/o dizziness and weakness after standing up. Patient states this lasted for 5 minutes. Patient's wife homecare nurse took BP at home was 88/40. Upon EMS arrival BP was 100/70. Oxygen on scene was 88% on room air, however patient had no respiratory complaints. Patient states this has happened before due to dehydration. Hx of a-fib, patient is on blood thinners. Patient denies [facility]. Chest pain, N/V/D. EMS gave 300 of normal saline.   BP 121/73 after fluids.

## 2022-04-05 NOTE — ED Notes (Signed)
Patient made aware of urine sample needed.

## 2022-04-05 NOTE — ED Notes (Addendum)
Lying  BP: 125/82  HR: 64  Sitting:  BP: 127/93 HR:73  Standing (0 minutes) BP:142/98 HR:84  Standing (3 minutes)  BP:134/90 HR:76

## 2022-04-05 NOTE — ED Provider Notes (Signed)
[facility] DEPT Provider Note  CSN: [id number] Arrival date & time: 04/05/22 1115  Chief Complaint(s) No chief complaint on file.  HPI Alex Alvarez is a 76 y.o. male with PMH salivary cancer status post removal, chronic A-fib on Eliquis and diltiazem, HTN who presents emergency department for evaluation of presyncope.  Patient states that he was sitting at his desk today and stood up when he began to feel woozy.  He walked to the fridge and attempted to drink some water.  His home health nurse took his blood pressure in the field and found him to be in the 80s and then called EMS who also found the patient to be hypotensive with some mild hypoxia with a oxygen saturation of 88%.  He received 1 L of normal saline in the field and arrives with improved blood pressures and overall symptomatically improved.  He states that he has had decreased p.o. intake recently and took his blood pressure medications this morning.  He states that this is happened before and feels that this is likely related to dehydration.  He also endorses increased urinary frequency.   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   No stenosis   Venous stasis of both lower extremities    with edema   Patient Active Problem List   Diagnosis Date Noted   Osteoarthritis of right knee 03/28/2019   Total knee replacement status 03/27/2019   Status post total knee replacement, right 03/26/2019   Pain in right knee 02/03/2019   Trigger ring finger of left hand 07/12/2018   Skin  ulcer of toe of right foot, limited to breakdown of skin ([place]) 03/02/2018   Seasonal allergies 12/31/2017   Toenail fungus 12/25/2016   Arthralgia of multiple joints 06/26/2016   Chronic insomnia 12/18/2015   H/O total knee replacement 07/07/2015   Venous stasis of both lower extremities 12/25/2013   Permanent atrial fibrillation ([place]) 12/25/2013   Dyslipidemia - on statin; monitored by PCP 12/25/2013   Obesity (BMI 30-39.9) 11/06/2007   Hypertension 11/06/2007   DYSPNEA 11/06/2007   Home Medication(s) Prior to Admission medications   Medication Sig Start Date End Date Taking? Authorizing Provider  amoxicillin (AMOXIL) 500 MG tablet Take 2,000 mg by mouth as directed. Take 4 capsules by mouth 1 hour prior to dental appt 09/27/19   [provider]  aspirin EC 81 MG tablet Take 81 mg by mouth daily.     [provider]  atorvastatin (LIPITOR) 10 MG tablet Take 1 tablet by mouth  daily 04/04/16   Leonie Man, MD  carboxymethylcellulose (REFRESH PLUS) 0.5 % SOLN Place 1 drop into both eyes 2 (two) times daily as needed (dry eyes).    [provider]  diclofenac (VOLTAREN) 75 MG EC tablet Take 75 mg by mouth daily.     [provider]  diltiazem (TIAZAC) 240 MG 24 hr capsule Take 240 mg by mouth daily. 02/20/21   [provider]  ELIQUIS 5 MG TABS tablet TAKE 1 TABLET BY MOUTH TWICE A DAY 03/11/22   Leonie Man, MD  EPINEPHrine 0.3 mg/0.3 mL IJ SOAJ injection Inject 0.3 mg into the muscle as needed for anaphylaxis.     [provider]  hydrochlorothiazide (HYDRODIURIL) 25 MG tablet TAKE 1 TABLET BY MOUTH EVERY DAY 11/29/20   Leonie Man, MD  irbesartan (AVAPRO) 300 MG tablet Take 1 tablet (300 mg total) by mouth daily. 12/24/19   Lendon Colonel, NP  Multiple Vitamins-Minerals (MULTIVITAMIN WITH MINERALS) tablet Take 1 tablet by mouth daily.    [provider]  traZODone (DESYREL) 100 MG tablet Take 0.5-1 tablets (50-100  mg total) by mouth at bedtime as needed for sleep (if Zolpidem doesn't work). 07/12/21   Leonie Man, MD  zolpidem (AMBIEN) 10 MG tablet Take 10 mg by mouth at bedtime. 02/15/20   [provider]                                                                                                                                    Past Surgical History Past Surgical History:  Procedure Laterality Date   Aorta doppler  11/12/2012   normal abdomnal aortic duplex    JOINT REPLACEMENT Left Jul 07, 2015   NM MYOVIEW LTD  07/30/2011   non-gated study seconadry to a fib;exercise capcity 7 mets--mildly reduced;low risk scan   SKIN BIOPSY N/A sept 2016   Basal cell from face.   TONSILLECTOMY     TOTAL KNEE ARTHROPLASTY Left 07/07/2015   Procedure: LEFT TOTAL KNEE ARTHROPLASTY;  Surgeon: Netta Cedars, MD;  Location: [facility];  Service: Orthopedics;  Laterality: Left;   TOTAL KNEE ARTHROPLASTY Right 03/26/2019   Procedure: TOTAL KNEE ARTHROPLASTY;  Surgeon: Netta Cedars, MD;  [facility];  Service: Orthopedics;  Laterality: Right;   TRANSTHORACIC ECHOCARDIOGRAM  02/22/2011   EF 50-55%; with mild inferior wall hypokinesis, mild left atrial enlargement of 4.2cm   TRANSTHORACIC ECHOCARDIOGRAM  11/12/2012   afib; EF 50-55% (CRO WMA b/c Afib - difficult gaiting). AoV sclerosis without stenosis   Family History Family History  Problem Relation Age of Onset   Cancer - Ovarian Mother    Cancer Maternal Grandfather     Social History Social History   Tobacco Use   Smoking status: Former    Types: Pipe    Quit date: 10/24/2011    Years since quitting: 10.4   Smokeless tobacco: Never  Substance Use Topics   Alcohol use: No   Drug use: No   Allergies Copper and Terbinafine  Review of Systems Review of Systems  Genitourinary:  Positive for frequency.  Neurological:  Positive for syncope and light-headedness.    Physical Exam Vital Signs  I have reviewed the triage vital signs Ht 5'  11" (1.803 m)   Wt 108.5 kg   BMI 33.36 kg/m   Physical Exam Constitutional:  General: He is not in acute distress.    Appearance: Normal appearance.  HENT:     Head: Normocephalic and atraumatic.     Nose: No congestion or rhinorrhea.  Eyes:     General:        Right eye: No discharge.        Left eye: No discharge.     Extraocular Movements: Extraocular movements intact.     Pupils: Pupils are equal, round, and reactive to light.  Cardiovascular:     Rate and Rhythm: Normal rate and regular rhythm.     Heart sounds: No murmur heard. Pulmonary:     Effort: No respiratory distress.     Breath sounds: No wheezing or rales.  Abdominal:     General: There is no distension.     Tenderness: There is no abdominal tenderness.  Musculoskeletal:        General: Normal range of motion.     Cervical back: Normal range of motion.  Skin:    General: Skin is warm and dry.  Neurological:     General: No focal deficit present.     Mental Status: He is alert.     ED Results and Treatments Labs (all labs ordered are listed, but only abnormal results are displayed) Labs Reviewed  COMPREHENSIVE METABOLIC PANEL  CBC WITH DIFFERENTIAL/PLATELET  URINALYSIS, ROUTINE W REFLEX MICROSCOPIC                                                                                                                          Radiology No results found.  Pertinent labs & imaging results that were available during my care of the patient were reviewed by me and considered in my medical decision making (see MDM for details).  Medications Ordered in ED Medications - No data to display                                                                                                                                   Procedures Procedures  (including critical care time)  Medical Decision Making / ED Course   This patient presents to the ED for concern of postural dizziness and lightheadedness, this  involves an extensive number of treatment options, and is a complaint that carries with it a high risk of complications and morbidity.  The differential diagnosis includes orthostatic presyncope, vasovagal presyncope, dehydration, cardiogenic syncope, electrolyte abnormality  MDM: Patient seen in the emergency room for evaluation of an episode of positional presyncope.  Physical exam is largely unremarkable.  Laboratory evaluation with a BUN of 28 creatinine 1.31 which is baseline for this patient, hemoglobin normal at 13.7, Trope is normal.  ECG nonischemic with no evidence of dysrhythmia, chest x-ray unchanged from previous.  Patient received a liter of fluid and on reevaluation, orthostatic vital signs and walk of life were performed with no return of symptoms.  Patient presentation appears consistent with orthostatic presyncope secondary to dehydration and patient safe for discharge with outpatient follow-up.   Additional history obtained: -Additional history obtained from daughter -External records from outside source obtained and reviewed including: Chart review including previous notes, labs, imaging, consultation notes   Lab Tests: -I ordered, reviewed, and interpreted labs.   The pertinent results include:   Labs Reviewed  COMPREHENSIVE METABOLIC PANEL  CBC WITH DIFFERENTIAL/PLATELET  URINALYSIS, ROUTINE W REFLEX MICROSCOPIC      EKG   EKG Interpretation  Date/Time:  Friday April 05 2022 11:54:46 EDT Ventricular Rate:  67 PR Interval:    QRS Duration: 94 QT Interval:  374 QTC Calculation: 395 R Axis:   84 Text Interpretation: Atrial fibrillation Borderline right axis deviation Anteroseptal infarct, old Confirmed by Karsten Vaughn ([id number]) on 04/05/2022 12:20:26 PM         Imaging Studies ordered: I ordered imaging studies including CXR I independently visualized and interpreted imaging. I agree with the radiologist interpretation   Medicines ordered and prescription  drug management: No orders of the defined types were placed in this encounter.   -I have reviewed the patients home medicines and have made adjustments as needed  Critical interventions none    Cardiac Monitoring: The patient was maintained on a cardiac monitor.  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     Final Clinical Impression(s) / ED Diagnoses Final diagnoses:  None     '@PCDICTATION'$ @    Teressa Lower, MD 04/05/22 1425

## 2022-04-17 ENCOUNTER — Ambulatory Visit: Payer: Medicare Other | Admitting: Podiatry

## 2022-04-17 DIAGNOSIS — M205X2 Other deformities of toe(s) (acquired), left foot: Secondary | ICD-10-CM

## 2022-04-17 NOTE — Progress Notes (Signed)
Chief Complaint  Patient presents with   [place]    Patient is here for left foot great toe., states that it is still in some pain.   Foot Pain    HPI: 76 y.o. male presenting today for evaluation of concern about the left great toe.  Patient is concerned that left great toe is beginning to overlap the second toe.  Patient states that he has no pain associated to the area.  He wears a toe spacer between the great toe and second toe to alleviate pressure.  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   TONSILLECTOMY     TOTAL KNEE ARTHROPLASTY Left 07/07/2015   Procedure: LEFT TOTAL KNEE ARTHROPLASTY;  Surgeon: Netta Cedars, MD;  Location: [facility];  Service: Orthopedics;  Laterality: Left;   TOTAL KNEE ARTHROPLASTY Right 03/26/2019   Procedure: TOTAL KNEE ARTHROPLASTY;  Surgeon: Netta Cedars, MD;  [facility];  Service: Orthopedics;  Laterality: Right;   TRANSTHORACIC ECHOCARDIOGRAM  02/22/2011   EF 50-55%; with mild inferior wall hypokinesis, mild left atrial enlargement of 4.2cm   TRANSTHORACIC ECHOCARDIOGRAM  11/12/2012   afib; EF 50-55% (CRO WMA b/c  Afib - difficult gaiting). AoV sclerosis without stenosis    Allergies  Allergen Reactions   Copper Rash   Terbinafine Rash     Physical Exam: General: The patient is alert and oriented x3 in no acute distress.  Dermatology: Skin is warm, dry and supple bilateral lower extremities. Negative for open lesions or macerations.  Vascular: Palpable pedal pulses bilaterally. Capillary refill within normal limits.  Negative for any significant edema or erythema  Neurological: Light touch and protective threshold grossly intact  Musculoskeletal Exam: There is some limited range of motion to the first MTP of the left foot with some lateral deviation of the hallux consistent with DJD/hallux limitus and hallux valgus deformity.  No associated tenderness to palpation  Assessment: 1.  Proximal limitus/DJD left   Plan of Care:  1. Patient evaluated.  2.  Patient states that the toe spacer alleviates any pain that he experiences.  Continue. 3.  Continue wearing good supportive shoes and sneakers 4.  Return to clinic as needed     Edrick Kins, DPM [facility]  Dr. Edrick Kins, DPM    [address]  [place], [place] [address]                Office [phone]  Fax [phone]

## 2022-08-04 NOTE — Progress Notes (Unsigned)
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   Chronic atrial fibrillation ([place])    Chronic insomnia    Chronically elevated hemidiaphragm    Left   Dyslipidemia    Former light tobacco smoker     smoked a pipe; quit 25+ years ago    Hypertension    Obesity (BMI 30.0-34.9)    Senile calcific aortic valve sclerosis 10/2012   No stenosis   Venous stasis of both lower extremities    with edema   Past Surgical History:  Procedure Laterality Date   Aorta doppler  11/12/2012   normal abdomnal aortic duplex    JOINT REPLACEMENT Left Jul 07, 2015   NM MYOVIEW LTD  07/30/2011   non-gated study seconadry to a fib;exercise capcity 7 mets--mildly reduced;low risk scan   SKIN BIOPSY N/A sept 2016   Basal cell from face.   TONSILLECTOMY     TOTAL KNEE ARTHROPLASTY Left 07/07/2015   Procedure: LEFT TOTAL KNEE ARTHROPLASTY;  Surgeon: Netta Cedars, MD;  Location: [facility];  Service: Orthopedics;  Laterality: Left;   TOTAL KNEE ARTHROPLASTY Right 03/26/2019   Procedure: TOTAL KNEE ARTHROPLASTY;  Surgeon: Netta Cedars, MD;  Location: [facility];  Service: Orthopedics;  Laterality: Right;   TRANSTHORACIC ECHOCARDIOGRAM  02/22/2011   EF 50-55%; with mild inferior wall hypokinesis, mild left atrial enlargement of 4.2cm   TRANSTHORACIC ECHOCARDIOGRAM  11/12/2012   afib; EF 50-55% (CRO WMA b/c Afib - difficult gaiting). AoV sclerosis without stenosis    Allergies  Allergies  Allergen Reactions   Copper Rash   Terbinafine Rash    History of Present Illness     Alex Alvarez has a past medical history of atrial fibrillation, hypertension, hyperlipidemia, chronic lower extremity edema, and loss of consciousness.  He was hospitalized May 2021 for syncope.  He followed up on 03/16/2020.  During that time it was felt that his syncope was related to orthostatic hypotension.  He has been visiting his wife in the ICU.  He had not been eating or drinking well.  He had also not been sleeping well.  He was very preoccupied with his wife's health.  He reported having an episode of postural dizziness even before presenting to the hospital.  He was last seen by Dr. Ellyn Hack on 05/19/2020.  During that time they reported that he was feeling much stronger.  He had been eating better and drinking better.  He reported that his wife was also healthier.  He denied further episodes of syncope and did not note his atrial fibrillation.  He presented the clinic 07/09/21 for follow-up evaluation stated he felt well.  He continued to stay well-hydrated and denied further episodes of presyncope/syncope.  He reported that he enjoyed spending time with his daughter and granddaughter.  His daughter had just purchased a quarter horse for his granddaughter to ride.  His daughter had been working with horses since she was young.  He reported compliance with his apixaban and denied bleeding issues.  He did occasionally notice  his atrial fibrillation.  He noticed palpitations occasionally when he was seated at rest.  He did not notice  chest discomfort when he was active.  We reviewed his EKG.  His blood pressure was well controlled.  I  ordered CBC, BMP, fasting lipids and LFTs.  We gave  salty 6 diet sheet and planned to see him back in 12 months.  He presents to the clinic today for follow-up evaluation and states he feels well today.  He continues to be somewhat physically active but is limited in his walking due to his left leg pain and dropping arch.  He is now wearing shoes with arch  support.  His granddaughter continues to ride horses.  His EKG today shows atrial fibrillation with slow ventricular response 56 bpm.  He reports compliance with his medications and denies side effects.  We will order fasting lipids and LFTs today, have him increase his physical activity as tolerated, continue his current medication regimen, and plan follow-up in 12 months.  Today he denies chest pain, shortness of breath, lower extremity edema, fatigue, palpitations, melena, hematuria, hemoptysis, diaphoresis, weakness, presyncope, syncope, orthopnea, and PND.    Home Medications    Prior to Admission medications   Medication Sig Start Date End Date Taking? Authorizing Provider  amoxicillin (AMOXIL) 500 MG tablet Take 2,000 mg by mouth as directed. Take 4 capsules by mouth 1 hour prior to dental appt 09/27/19   [provider]  apixaban (ELIQUIS) 5 MG TABS tablet Take 1 tablet (5 mg total) by mouth 2 (two) times daily. 06/05/21   Alex Man, MD  aspirin EC 81 MG tablet Take 81 mg by mouth daily.     [provider]  atorvastatin (LIPITOR) 10 MG tablet Take 1 tablet by mouth  daily 04/04/16   Alex Man, MD  carboxymethylcellulose (REFRESH PLUS) 0.5 % SOLN Place 1 drop into both eyes 2 (two) times daily as needed (dry eyes).    [provider]  diclofenac (VOLTAREN) 75 MG EC tablet Take 75 mg by mouth daily.     [provider]  diltiazem (CARDIZEM CD) 180 MG 24 hr capsule TAKE 1 CAPSULE BY MOUTH EVERY DAY 03/12/21   Alex Man, MD  EPINEPHrine (EPIPEN 2-PAK) 0.3 mg/0.3 mL IJ SOAJ injection Inject 0.3 mg into the muscle as needed for anaphylaxis.     [provider]  hydrochlorothiazide (HYDRODIURIL) 25 MG tablet TAKE 1 TABLET BY MOUTH EVERY DAY 11/29/20   Alex Man, MD  irbesartan (AVAPRO) 300 MG tablet Take 1 tablet (300 mg total) by mouth daily. 12/24/19   Lendon Colonel, NP  Multiple Vitamins-Minerals (MULTIVITAMIN WITH  MINERALS) tablet Take 1 tablet by mouth daily.    [provider]  traZODone (DESYREL) 100 MG tablet Take 50-100 mg by mouth at bedtime as needed for sleep (if Zolpidem doesn't work).  07/07/19   [provider]  zolpidem (AMBIEN) 10 MG tablet Take 10 mg by mouth at bedtime. 02/15/20   [provider]    Family History    Family History  Problem Relation Age of Onset   Cancer - Ovarian Mother    Cancer Maternal Grandfather    He indicated that his mother is deceased. He indicated that his father is deceased. He indicated that only one of his two sisters is alive. He indicated that his brother is alive. He indicated that his maternal grandmother is deceased. He indicated that his maternal grandfather is deceased.  He indicated that his paternal grandmother is deceased. He indicated that his paternal grandfather is deceased. He indicated that both of his children are alive.  Social History    Social History   Socioeconomic History   Marital status: Married    Spouse name: Not on file   Number of children: 2   Years of education: Not on file   Highest education level: Not on file  Occupational History   Not on file  Tobacco Use   Smoking status: Former    Types: Pipe    Quit date: 10/24/2011    Years since quitting: 10.7   Smokeless tobacco: Never  Substance and Sexual Activity   Alcohol use: No   Drug use: No   Sexual activity: Not on file  Other Topics Concern   Not on file  Social History Narrative   He is a full-time Optometrist for [employer].   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   He is active and ambulatory, walking on a regular basis roughly 3-5 days a week 3 miles a time.   He used to smoke a pipe but quit 25 years ago.   Social Determinants of Health   Financial Resource Strain: Not on file  Food Insecurity: Not on file  Transportation Needs: Not on file  Physical Activity: Not on file  Stress: Not on file  Social  Connections: Not on file  Intimate Partner Violence: Not on file     Review of Systems    General:  No chills, fever, night sweats or weight changes.  Cardiovascular:  No chest pain, dyspnea on exertion, edema, orthopnea, palpitations, paroxysmal nocturnal dyspnea. Dermatological: No rash, lesions/masses Respiratory: No cough, dyspnea Urologic: No hematuria, dysuria Abdominal:   No nausea, vomiting, diarrhea, bright red blood per rectum, melena, or hematemesis Neurologic:  No visual changes, wkns, changes in mental status. All other systems reviewed and are otherwise negative except as noted above.  Physical Exam    VS:  BP 118/64 (BP Location: Left Arm, Patient Position: Sitting, Cuff Size: Large)   Pulse (!) 57   Ht '5\' 10"'$  (1.778 m)   Wt 239 lb 6.4 oz (108.6 kg)   BMI 34.35 kg/m  , BMI Body mass index is 34.35 kg/m. GEN: Well nourished, well developed, in no acute distress. HEENT: normal. Neck: Supple, no JVD, carotid bruits, or masses. Cardiac: RRR, no murmurs, rubs, or gallops. No clubbing, cyanosis, edema.  Radials/DP/PT 2+ and equal bilaterally.  Respiratory:  Respirations regular and unlabored, clear to auscultation bilaterally. GI: Soft, nontender, nondistended, BS + x 4. MS: no deformity or atrophy. Skin: warm and dry, no rash. Neuro:  Strength and sensation are intact. Psych: Normal affect.  Accessory Clinical Findings    Recent Labs: 04/05/2022: ALT 22; BUN 28; Creatinine, Ser 1.31; Hemoglobin 13.7; Platelets 142; Potassium 4.1; Sodium 137   Recent Lipid Panel    Component Value Date/Time   CHOL 121 07/09/2021 0934   TRIG 65 07/09/2021 0934   HDL 44 07/09/2021 0934   CHOLHDL 2.8 07/09/2021 0934   LDLCALC 63 07/09/2021 0934    ECG personally reviewed by me today-EKG today shows atrial fibrillation with slow ventricular response 56 bpm  EKG 07/09/2021 atrial fibrillation 68 bpm- No acute changes  Echocardiogram 11/12/2012  Study Conclusions   - Left  ventricle: The cavity size was normal. Systolic    function was normal. The estimated ejection fraction was    in the range of 50% to  55%. Regional wall motion    abnormalities cannot be excluded. The study is not    technically sufficient to allow evaluation of LV diastolic    function.  - Aortic valve: Trivial regurgitation.  - Mitral valve: Mild regurgitation.  - Atrial septum: No defect or patent foramen ovale was    identified.  Impressions:   - AF with controlled VR.    Technically very difficult study. Normal pulmonary artery    pressure.  Transthoracic echocardiography.  M-mode, complete 2D,  spectral Doppler, and color Doppler.  Height:  Height:  185.4cm. Height: 73in.  Weight:  Weight: 105.7kg. Weight:  232.5lb.  Body mass index:  BMI: 30.7kg/m^2.  Body surface  area:    BSA: 2.83m2.  Blood pressure:     140/70.  Patient  status:  Outpatient.  Location:  [facility].    Assessment & Plan   1.  Atrial fibrillation-EKG today shows atrial fibrillation 56 bpm.  Continues to be cardiac unaware.  No recent episodes of irregular or accelerated heartbeat.  Compliant with apixaban and denies bleeding issues. Continue apixaban, Cardizem Heart healthy low-sodium diet-salty 6 reviewed Increase physical activity as tolerated  Essential hypertension-BP today 118/64.  Well-controlled at home. Continue HCTZ, irbesartan, diltiazem Heart healthy low-sodium diet Increase physical activity as tolerated  Hyperlipidemia- LDL 63 11/22 Continue aspirin, atorvastatin Heart healthy low-sodium high-fiber diet Increase physical activity as tolerated Repeat fasting lipids and LFTs  Syncope-resolved denies further episodes.  Previous syncopal episode happened in the setting of poor nutrition and hydration.  His wife had been admitted to the ICU and he was preoccupied with/concerns with her wellbeing. Continue current diet and hydration  Disposition: Follow-up with Dr. HEllyn Hackin 12  months.   JJossie Ng Wilmoth Rasnic NP-C    08/06/2022, 9:12 AM [id number] Office ([phone] ([phone] Notice: This dictation was prepared with Dragon dictation along with smaller phrase technology. Any transcriptional errors that result from this process are unintentional and may not be corrected upon review.  I spent 13 minutes examining this patient, reviewing medications, and using patient centered shared decision making involving her cardiac care.  Prior to her visit I spent greater than 20 minutes reviewing her past medical history,  medications, and prior cardiac tests.

## 2022-08-06 ENCOUNTER — Ambulatory Visit: Payer: Medicare Other | Attending: General Practice | Admitting: General Practice

## 2022-08-06 ENCOUNTER — Encounter: Payer: Self-pay | Admitting: General Practice

## 2022-08-06 VITALS — BP 118/64 | HR 57 | Ht 70.0 in | Wt 239.4 lb

## 2022-08-06 DIAGNOSIS — I10 Essential (primary) hypertension: Secondary | ICD-10-CM | POA: Diagnosis not present

## 2022-08-06 DIAGNOSIS — I4821 Permanent atrial fibrillation: Secondary | ICD-10-CM | POA: Diagnosis not present

## 2022-08-06 DIAGNOSIS — E785 Hyperlipidemia, unspecified: Secondary | ICD-10-CM | POA: Diagnosis not present

## 2022-08-06 DIAGNOSIS — I951 Orthostatic hypotension: Secondary | ICD-10-CM | POA: Diagnosis not present

## 2022-08-06 LAB — HEPATIC FUNCTION PANEL
ALT: 24 IU/L (ref 0–44)
AST: 18 IU/L (ref 0–40)
Albumin: 4.3 g/dL (ref 3.8–4.8)
Alkaline Phosphatase: 119 IU/L (ref 44–121)
Bilirubin Total: 0.6 mg/dL (ref 0.0–1.2)
Bilirubin, Direct: 0.22 mg/dL (ref 0.00–0.40)
Total Protein: 6.3 g/dL (ref 6.0–8.5)

## 2022-08-06 LAB — LIPID PANEL
Chol/HDL Ratio: 2.3 ratio (ref 0.0–5.0)
Cholesterol, Total: 107 mg/dL (ref 100–199)
HDL: 46 mg/dL (ref >39)
LDL Chol Calc (NIH): 49 mg/dL (ref 0–99)
Triglycerides: 51 mg/dL (ref 0–149)
VLDL Cholesterol Cal: 12 mg/dL (ref 5–40)

## 2022-08-06 NOTE — Patient Instructions (Signed)
Medication Instructions:  The current medical regimen is effective;  continue present plan and medications as directed. Please refer to the Current Medication list given to you today.  *If you need a refill on your cardiac medications before your next appointment, please call your pharmacy*   Lab Work: LIPIDS AND LFT TODAY  If you have labs (blood work) drawn today and your tests are completely normal, you will receive your results only by:  [place] (if you have MyChart) OR A paper copy in the mail If you have any lab test that is abnormal or we need to change your treatment, we will call you to review the results.  Other Instructions PLEASE READ AND FOLLOW ATTACHED  SALTY 6   MAINTAIN YOUR PHYSICAL ACTIVITY-AS TOLERATED  Testing/Procedures: NONE  Follow-Up: At [facility], you and your health needs are our priority.  As part of our continuing mission to provide you with exceptional heart care, we have created designated Provider Care Teams.  These Care Teams include your primary Cardiologist (physician) and Advanced Practice Providers (APPs -  Physician Assistants and Nurse Practitioners) who all work together to provide you with the care you need, when you need it.  Your next appointment:   12 month(s)  The format for your next appointment:   In Person  Provider:   Glenetta Hew, MD     Important Information About Sugar

## 2022-08-07 NOTE — Addendum Note (Signed)
Addended by: Waylan Rocher on: 08/07/2022 10:36 AM   Modules accepted: Orders

## 2022-10-16 ENCOUNTER — Ambulatory Visit (INDEPENDENT_AMBULATORY_CARE_PROVIDER_SITE_OTHER): Payer: Medicare Other

## 2022-10-16 ENCOUNTER — Ambulatory Visit: Payer: Medicare Other | Admitting: Podiatry

## 2022-10-16 DIAGNOSIS — M79672 Pain in left foot: Secondary | ICD-10-CM

## 2022-10-16 DIAGNOSIS — M14672 Charcot's joint, left ankle and foot: Secondary | ICD-10-CM | POA: Diagnosis not present

## 2022-10-16 NOTE — Progress Notes (Signed)
Chief Complaint  Patient presents with   Foot Pain    Patient came in today for left foot arch, patient states that the arch collapsed 6 months ago, patient is having pain when not wear arch support, X-rays done today,      HPI: 77 y.o. male presenting today for evaluation of pain and tenderness with collapsed arch that occurred about 6 months ago so the patient can recall.  Patient states that he noticed his arches collapse about 6 months ago to the left foot.  Idiopathic.  Denies a history of injury or trauma to the area.  He has been having plantar foot pain now for the past month and decided to come in for evaluation.  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   Chronic atrial fibrillation ([facility])    Chronic insomnia    Chronically elevated hemidiaphragm    Left   Dyslipidemia    Former light tobacco smoker     smoked a pipe; quit 25+ years ago    Hypertension    Obesity (BMI 30.0-34.9)    Senile calcific aortic valve sclerosis 10/2012   No stenosis   Venous stasis of both lower extremities    with edema    Past Surgical History:  Procedure Laterality Date   Aorta doppler  11/12/2012   normal abdomnal aortic duplex    JOINT REPLACEMENT Left Jul 07, 2015   NM MYOVIEW LTD  07/30/2011   non-gated study seconadry to a fib;exercise capcity 7 mets--mildly reduced;low risk scan   SKIN BIOPSY N/A sept 2016   Basal cell from face.   TONSILLECTOMY     TOTAL KNEE ARTHROPLASTY Left 07/07/2015   Procedure: LEFT TOTAL KNEE ARTHROPLASTY;  Surgeon: Netta Cedars, MD;  Location: [facility];  Service: Orthopedics;  Laterality: Left;   TOTAL KNEE ARTHROPLASTY Right 03/26/2019   Procedure: TOTAL KNEE ARTHROPLASTY;  Surgeon: Netta Cedars, MD;  [facility];  Service: Orthopedics;  Laterality: Right;   TRANSTHORACIC ECHOCARDIOGRAM  02/22/2011   EF 50-55%; with mild inferior wall  hypokinesis, mild left atrial enlargement of 4.2cm   TRANSTHORACIC ECHOCARDIOGRAM  11/12/2012   afib; EF 50-55% (CRO WMA b/c Afib - difficult gaiting). AoV sclerosis without stenosis    Allergies  Allergen Reactions   Copper Rash   Terbinafine Rash     Physical Exam: General: The patient is alert and oriented x3 in no acute distress.  Dermatology: Skin is warm, dry and supple bilateral lower extremities. Negative for open lesions or macerations.  There is a mild to moderate callus noted along the plantar aspect of the midfoot lateral column.  No open wound however  Vascular: Palpable pedal pulses bilaterally. Capillary refill within normal limits.  Negative for any significant edema or erythema  Neurological: Light touch and protective threshold grossly intact  Musculoskeletal Exam: Collapse of the medial longitudinal arch of the foot with prominence to the lateral column of the foot noted.  No open wounds.  Findings are consistent with a Charcot type neuroarthropathy  Radiographic Exam LT foot 10/16/2022:  Compared to prior x-rays taken 10/01/2022.  New findings of severe advanced degenerative changes noted with osseous erosions and fragmentations throughout the entire midtarsal joint of the foot.  Collapse of the medial longitudinal arch of the foot noted.  Findings are consistent with Charcot neuroarthropathy  Assessment: 1.  Charcot neuroarthropathy  left foot; nondiabetic -Patient evaluated.  X-rays were reviewed in detail with the patient. -Pathology and etiology of Charcot was explained to the patient.  Also explained the severity and seriousness of the degenerative changes which can lead to ulcer of the foot which can eventually lead to possible limb loss. -Appointment with orthotics department to be molded for diabetic type accommodative insoles to evenly distribute pressure throughout the foot and offload pressure from the plantar protuberance along the lateral column of the  midfoot -Recommend reduced activity.  I did not place the patient in a cam boot because I do believe the Charcot appears somewhat stable. -Return to clinic 3 months for follow-up x-ray or sooner if notices any acute changes to the foot or development of wounds      Edrick Kins, DPM [facility]  Dr. Edrick Kins, DPM    [address], [place] [address]                Office [phone]  Fax [phone]

## 2022-10-18 ENCOUNTER — Ambulatory Visit (INDEPENDENT_AMBULATORY_CARE_PROVIDER_SITE_OTHER): Payer: Medicare Other

## 2022-10-18 DIAGNOSIS — M14672 Charcot's joint, left ankle and foot: Secondary | ICD-10-CM

## 2022-10-18 NOTE — Progress Notes (Signed)
Patient presents today to be casted for custom molded orthotics. EVANS is the treating physician.  Impression foam cast was taken. ABN signed.  Patient info-  Shoe size: 12W  Shoe style: ATHLETIC  Height: 5FT 11IN  Weight: 240 LBS  Insurance: [employer] MEDICARE  PREDETERMINATION WILL BE DONE FOR COVERAGE OF L3020 ORTHOTICS.   Patient will be notified once orthotics arrive in office and reappoint for fitting at that time.

## 2022-10-24 NOTE — Progress Notes (Addendum)
Predetermination faxed 10/24/22 - documentation will be updated and sent to scan center as it comes in.    11/04/22 received pre-determination back stating that it was dismissed by the requester (patient) .  Called patient and left message for them to call back and verify that they want [place] to cancel the order for orthotics.

## 2022-11-06 NOTE — Addendum Note (Signed)
Addended by: Edrick Kins on: 11/06/2022 07:57 PM   Modules accepted: Orders

## 2022-12-06 ENCOUNTER — Telehealth: Payer: Self-pay | Admitting: Podiatry

## 2022-12-06 NOTE — Telephone Encounter (Signed)
Patient lvm, he would like to know the status of his orthotics.

## 2022-12-09 NOTE — Telephone Encounter (Signed)
Left message on patients voicemail that we received letter from [facility] and they are review his appeal  .- letter will be sent to scan center to be uploaded in pts chart.

## 2022-12-09 NOTE — Telephone Encounter (Signed)
Received letter from [facility] saying they are still reviewing appeal for orthotics.

## 2022-12-16 NOTE — Telephone Encounter (Signed)
Spoke with patient he said [facility] said we do not need authorization and we could go ahead and submit order.

## 2022-12-25 ENCOUNTER — Ambulatory Visit: Payer: Medicare Other | Admitting: Podiatry

## 2022-12-25 ENCOUNTER — Telehealth: Payer: Self-pay | Admitting: *Deleted

## 2022-12-25 ENCOUNTER — Ambulatory Visit (INDEPENDENT_AMBULATORY_CARE_PROVIDER_SITE_OTHER): Payer: Medicare Other

## 2022-12-25 DIAGNOSIS — M14672 Charcot's joint, left ankle and foot: Secondary | ICD-10-CM

## 2022-12-25 DIAGNOSIS — M79672 Pain in left foot: Secondary | ICD-10-CM | POA: Diagnosis not present

## 2022-12-25 NOTE — Telephone Encounter (Signed)
-----   Message from Felecia Shelling, DPM sent at 12/25/2022 12:22 PM EDT ----- Regarding: External referral to orthopedics I placed a referral for this patient to Dr. Lucita Ferrara, [facility]. Could we please f/u with his office to make sure they get the referral and they get him in promptly.   Thanks, Dr. Logan Bores

## 2022-12-25 NOTE — Progress Notes (Signed)
Chief Complaint  Patient presents with   Foot Ulcer    Charcot's left foot ulcer, and right ankle ulcer, rate of pain 6 out of 10, No Drainage, Blood thinner     HPI: 77 y.o. 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  Patient has noticed a lesion now to the plantar aspect of the foot unfortunately.  He says that he has been trying to reduce his activity  Brief history: Patient states that he noticed his arches collapse about 8 months ago to the left foot.  Idiopathic.  Denies a history of injury or trauma to the area.  Patient was seen in the office 10/16/2022 diagnosed with Charcot neuroarthropathy of the foot.  Recommended reduced activity.  He was also molded for custom diabetic shoes and insoles.  At that time there was no ulcer development to the plantar aspect of the foot.  Patient also states that he has developed an ulcer to the lateral aspect of the right ankle secondary to some boots that he wore.  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   Chronic atrial fibrillation (HCC)    Chronic insomnia    Chronically elevated hemidiaphragm    Left   Dyslipidemia    Former light tobacco smoker     smoked a pipe; quit 25+ years ago    Hypertension    Obesity (BMI 30.0-34.9)    Senile calcific aortic valve sclerosis 10/2012   No stenosis   Venous stasis of both lower extremities    with edema    Past Surgical History:  Procedure Laterality Date   Aorta doppler  11/12/2012   normal abdomnal aortic duplex    JOINT REPLACEMENT Left Jul 07, 2015   NM MYOVIEW LTD  07/30/2011   non-gated study seconadry to a fib;exercise capcity 7 mets--mildly reduced;low risk scan   SKIN BIOPSY N/A sept 2016   Basal cell from face.    TONSILLECTOMY     TOTAL KNEE ARTHROPLASTY Left 07/07/2015   Procedure: LEFT TOTAL KNEE ARTHROPLASTY;  Surgeon: Beverely Low, MD;  Location: [facility] OR;  Service: Orthopedics;  Laterality: Left;   TOTAL KNEE ARTHROPLASTY Right 03/26/2019   Procedure: TOTAL KNEE ARTHROPLASTY;  Surgeon: Beverely Low, MD;  Location: [facility] ORS;  Service: Orthopedics;  Laterality: Right;   TRANSTHORACIC ECHOCARDIOGRAM  02/22/2011   EF 50-55%; with mild inferior wall hypokinesis, mild left atrial enlargement of 4.2cm   TRANSTHORACIC ECHOCARDIOGRAM  11/12/2012   afib; EF 50-55% (CRO WMA b/c Afib - difficult gaiting). AoV sclerosis without stenosis    Allergies  Allergen Reactions   Copper Rash   Terbinafine Rash     LT foot 12/25/2022  Physical Exam: General: The patient is alert and oriented x3 in no acute distress.  Dermatology: Bullous blister/preulcerative lesion noted to the plantar lateral column of the left foot concerning for advanced erosive changes secondary to Charcot.  Please see above noted photo  There is also an ulcer to the lateral aspect of the right ankle measuring approximately 0.5 x 0.5 x 0.1 cm overlying the fibular malleolus.  It appears stable with a granular wound base.  No purulence.  Periwound intact.  No exposed bone muscle tendon ligament or joint  Vascular: Palpable pedal  pulses bilaterally. Capillary refill within normal limits.  Negative for any significant edema or erythema  Neurological: Light touch and protective threshold grossly intact  Musculoskeletal Exam: Continued progressive collapse of the medial longitudinal arch of the foot with prominence to the lateral column of the foot noted.  Bullous blister noted along the plantar lateral aspect of the foot underlying the cuboid.  Findings are consistent with a Charcot type neuroarthropathy  Radiographic Exam LT foot 10/16/2022:  Compared to prior x-rays taken 10/01/2022.  New findings of severe advanced degenerative changes noted with  osseous erosions and fragmentations throughout the entire midtarsal joint of the foot.  Collapse of the medial longitudinal arch of the foot noted.  Findings are consistent with Charcot neuroarthropathy  Radiographic exam LT foot 12/25/2022: Progressive advanced erosive changes noted throughout the midtarsal joint with disarticulation of the cuboid.  Charcot neuroarthropathy throughout the midtarsal joint with advanced erosive changes  Assessment: 1.  Charcot neuroarthropathy left foot; nondiabetic -Patient evaluated.  X-rays were reviewed in detail with the patient and compared to prior x-rays. -Cam boot dispensed.  NWB.  Stressed the importance of strict nonweightbearing to the lower extremity.  Patient understands.  He does have a walker at home that he will use for nonweightbearing. -Referral placed to orthopedics, Dr. Lucita Ferrara, [facility] who is well-known to manage surgically Charcot neuroarthropathy of the foot.  Always greatly appreciated.  I explained to the patient that he may benefit from surgical revision and stabilization of the foot to prevent ulcer development  2.  Ulcer lateral aspect of the right ankle -Continue antibiotic ointment and a light dressing to the lateral aspect of the right ankle      Felecia Shelling, DPM [facility]  Dr. Felecia Shelling, DPM    [name] [place], [place] [address]                Office [phone]  Fax [phone]

## 2022-12-25 NOTE — Telephone Encounter (Signed)
Called Dr Roby Lofts office for status on referral that was placed. Spoke with scheduling and they have not received , refaxed to [phone]. Patient has been updated.

## 2022-12-31 ENCOUNTER — Other Ambulatory Visit: Payer: Self-pay | Admitting: Podiatry

## 2022-12-31 DIAGNOSIS — M79672 Pain in left foot: Secondary | ICD-10-CM

## 2022-12-31 DIAGNOSIS — M14672 Charcot's joint, left ankle and foot: Secondary | ICD-10-CM

## 2023-01-15 ENCOUNTER — Ambulatory Visit: Payer: Medicare Other | Admitting: Podiatry

## 2023-05-06 ENCOUNTER — Ambulatory Visit: Payer: Medicare Other | Admitting: Student

## 2023-06-03 DEATH — deceased

## 2023-06-06 ENCOUNTER — Ambulatory Visit: Payer: Medicare Other | Admitting: Student

## 2023-06-15 NOTE — Progress Notes (Deleted)
Cardiology Clinic Note   Patient Name: Alex Alvarez Date of Encounter: 06/15/2023  Primary Care Provider:  Barbie Banner, MD Primary Cardiologist:  Bryan Lemma, MD  Patient Profile    77 year old male with history of atrial fibrillation on apixaban CHA2DS2-VASc score 2 (age hypertension), dyslipidemia, chronic lower extremity edema, and hypertension.  Episodes of orthostatic hypotension in 2021.  Last seen in the office by Edd Fabian, NP on 08/06/2022.  No changes in his medication regimen.  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   Chronic atrial fibrillation (HCC)    Chronic insomnia    Chronically elevated hemidiaphragm    Left   Dyslipidemia    Former light tobacco smoker     smoked a pipe; quit 25+ years ago    Hypertension    Obesity (BMI 30.0-34.9)    Senile calcific aortic valve sclerosis 10/2012   No stenosis   Venous stasis of both lower extremities    with edema   Past Surgical History:  Procedure Laterality Date   Aorta doppler  11/12/2012   normal abdomnal aortic duplex    JOINT REPLACEMENT Left Jul 07, 2015   NM MYOVIEW LTD  07/30/2011   non-gated study seconadry to a fib;exercise capcity 7 mets--mildly reduced;low risk scan   SKIN BIOPSY N/A sept 2016   Basal cell from face.   TONSILLECTOMY     TOTAL KNEE ARTHROPLASTY Left 07/07/2015   Procedure: LEFT TOTAL KNEE ARTHROPLASTY;  Surgeon: Beverely Low, MD;  Location: [facility] OR;  Service: Orthopedics;  Laterality: Left;   TOTAL KNEE ARTHROPLASTY Right 03/26/2019   Procedure: TOTAL KNEE ARTHROPLASTY;  Surgeon: Beverely Low, MD;  Location: [facility] ORS;  Service: Orthopedics;  Laterality: Right;   TRANSTHORACIC ECHOCARDIOGRAM  02/22/2011   EF 50-55%; with mild inferior wall hypokinesis, mild left atrial enlargement of 4.2cm   TRANSTHORACIC ECHOCARDIOGRAM  11/12/2012   afib;  EF 50-55% (CRO WMA b/c Afib - difficult gaiting). AoV sclerosis without stenosis    Allergies  Allergies  Allergen Reactions   Copper Rash   Terbinafine Rash    History of Present Illness    Mr. Alex Alvarez returns to the office today for ongoing assessment and management of atrial fibrillation, hypertension, and hyperlipidemia.  Home Medications    Current Outpatient Medications  Medication Sig Dispense Refill   amoxicillin (AMOXIL) 500 MG tablet Take 2,000 mg by mouth as directed. Take 4 capsules by mouth 1 hour prior to dental appt     aspirin EC 81 MG tablet Take 81 mg by mouth daily.      atorvastatin (LIPITOR) 10 MG tablet Take 1 tablet by mouth  daily 90 tablet 3   carboxymethylcellulose (REFRESH PLUS) 0.5 % SOLN Place 1 drop into both eyes 2 (two) times daily as needed (dry eyes).     diclofenac (VOLTAREN) 75 MG EC tablet Take 75 mg by mouth daily.      diltiazem (TIAZAC) 240 MG 24 hr capsule Take 240 mg by mouth daily.     ELIQUIS 5 MG TABS tablet TAKE 1 TABLET BY MOUTH TWICE A DAY 60 tablet 5   EPINEPHrine 0.3 mg/0.3 mL IJ SOAJ injection Inject 0.3 mg into the muscle as needed for anaphylaxis.      hydrochlorothiazide (HYDRODIURIL) 25 MG tablet TAKE 1 TABLET BY MOUTH EVERY DAY 90  tablet 3   irbesartan (AVAPRO) 300 MG tablet Take 1 tablet (300 mg total) by mouth daily. 90 tablet 3   Multiple Vitamins-Minerals (MULTIVITAMIN WITH MINERALS) tablet Take 1 tablet by mouth daily.     traZODone (DESYREL) 100 MG tablet Take 0.5-1 tablets (50-100 mg total) by mouth at bedtime as needed for sleep (if Zolpidem doesn't work). 30 tablet 0   zolpidem (AMBIEN) 10 MG tablet Take 10 mg by mouth at bedtime.     No current facility-administered medications for this visit.     Family History    Family History  Problem Relation Age of Onset   Cancer - Ovarian Mother    Cancer Maternal Grandfather    He indicated that his mother is deceased. He indicated that his father is deceased. He  indicated that only one of his two sisters is alive. He indicated that his brother is alive. He indicated that his maternal grandmother is deceased. He indicated that his maternal grandfather is deceased. He indicated that his paternal grandmother is deceased. He indicated that his paternal grandfather is deceased. He indicated that both of his children are alive.  Social History    Social History   Socioeconomic History   Marital status: Married    Spouse name: Not on file   Number of children: 2   Years of education: Not on file   Highest education level: Not on file  Occupational History   Not on file  Tobacco Use   Smoking status: Former    Types: Pipe    Quit date: 10/24/2011    Years since quitting: 11.6   Smokeless tobacco: Never  Substance and Sexual Activity   Alcohol use: No   Drug use: No   Sexual activity: Not on file  Other Topics Concern   Not on file  Social History Narrative   He is a full-time Airline pilot for [employer].   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   He is active and ambulatory, walking on a regular basis roughly 3-5 days a week 3 miles a time.   He used to smoke a pipe but quit 25 years ago.   Social Determinants of Health   Financial Resource Strain: Not on file  Food Insecurity: Low Risk  (05/21/2023)   Received from [facility]   Hunger Vital Sign    Worried About Running Out of Food in the Last Year: Never true    Ran Out of Food in the Last Year: Never true  Transportation Needs: No Transportation Needs (05/21/2023)   Received from [facility]    In the past 12 months, has lack of reliable transportation kept you from medical appointments, meetings, work or from getting things needed for daily living? : No  Physical Activity: Not on file  Stress: Not on file  Social Connections: Not on file  Intimate Partner Violence: Not on file     Review of Systems    General:  No chills, fever, night sweats or  weight changes.  Cardiovascular:  No chest pain, dyspnea on exertion, edema, orthopnea, palpitations, paroxysmal nocturnal dyspnea. Dermatological: No rash, lesions/masses Respiratory: No cough, dyspnea Urologic: No hematuria, dysuria Abdominal:   No nausea, vomiting, diarrhea, bright red blood per rectum, melena, or hematemesis Neurologic:  No visual changes, wkns, changes in mental status. All other systems reviewed and are otherwise negative except as noted above.       Physical Exam    VS:  There were no vitals taken for this visit. , BMI There is no height or weight on file to calculate BMI.     GEN: Well nourished, well developed, in no acute distress. HEENT: normal. Neck: Supple, no JVD, carotid bruits, or masses. Cardiac: RRR, no murmurs, rubs, or gallops. No clubbing, cyanosis, edema.  Radials/DP/PT 2+ and equal bilaterally.  Respiratory:  Respirations regular and unlabored, clear to auscultation bilaterally. GI: Soft, nontender, nondistended, BS + x 4. MS: no deformity or atrophy. Skin: warm and dry, no rash. Neuro:  Strength and sensation are intact. Psych: Normal affect.      Lab Results  Component Value Date   WBC 6.5 04/05/2022   HGB 13.7 04/05/2022   HCT 42.1 04/05/2022   MCV 104.5 (H) 04/05/2022   PLT 142 (L) 04/05/2022   Lab Results  Component Value Date   CREATININE 1.31 (H) 04/05/2022   BUN 28 (H) 04/05/2022   NA 137 04/05/2022   K 4.1 04/05/2022   CL 104 04/05/2022   CO2 28 04/05/2022   Lab Results  Component Value Date   ALT 24 08/06/2022   AST 18 08/06/2022   ALKPHOS 119 08/06/2022   BILITOT 0.6 08/06/2022   Lab Results  Component Value Date   CHOL 107 08/06/2022   HDL 46 08/06/2022   LDLCALC 49 08/06/2022   TRIG 51 08/06/2022   CHOLHDL 2.3 08/06/2022    No results found for: "HGBA1C"   Review of Prior Studies      Assessment & Plan   1.  ***     {Are you ordering a CV Procedure (e.g. stress test, cath, DCCV, TEE, etc)?    Press F2        :[id number]}   Signed, Bettey Mare. Liborio Nixon, ANP, AACC   06/15/2023 2:06 PM      Office [phone] Fax [phone]  Notice: This dictation was prepared with Dragon dictation along with smaller phrase technology. Any transcriptional errors that result from this process are unintentional and may not be corrected upon review.

## 2023-06-17 ENCOUNTER — Ambulatory Visit: Payer: Medicare Other | Attending: Student | Admitting: Adult Health

## 2024-04-23 ENCOUNTER — Telehealth: Payer: Self-pay | Admitting: Podiatry

## 2024-04-23 NOTE — Telephone Encounter (Signed)
 Recd request from [facility] for cd/disc of images for pt. Mailed dates of services 10/16/22 and 12/25/22 for [facility] - Dr. Janit.
